# Patient Record
Sex: Female | Born: 1965 | Race: Black or African American | Hispanic: No | Marital: Married | Smoking: Never smoker
Health system: Southern US, Community
[De-identification: ages and names within clinical notes are randomized; demographics above are authoritative.]

## PROBLEM LIST (undated history)

## (undated) DIAGNOSIS — J189 Pneumonia, unspecified organism: Secondary | ICD-10-CM

## (undated) DIAGNOSIS — I509 Heart failure, unspecified: Secondary | ICD-10-CM

## (undated) DIAGNOSIS — C50919 Malignant neoplasm of unspecified site of unspecified female breast: Secondary | ICD-10-CM

## (undated) DIAGNOSIS — IMO0001 Reserved for inherently not codable concepts without codable children: Secondary | ICD-10-CM

## (undated) DIAGNOSIS — Z8679 Personal history of other diseases of the circulatory system: Secondary | ICD-10-CM

## (undated) DIAGNOSIS — Z794 Long term (current) use of insulin: Secondary | ICD-10-CM

## (undated) DIAGNOSIS — E119 Type 2 diabetes mellitus without complications: Secondary | ICD-10-CM

## (undated) DIAGNOSIS — Z923 Personal history of irradiation: Secondary | ICD-10-CM

## (undated) DIAGNOSIS — I10 Essential (primary) hypertension: Secondary | ICD-10-CM

## (undated) DIAGNOSIS — I428 Other cardiomyopathies: Secondary | ICD-10-CM

## (undated) DIAGNOSIS — N6092 Unspecified benign mammary dysplasia of left breast: Secondary | ICD-10-CM

## (undated) DIAGNOSIS — D0512 Intraductal carcinoma in situ of left breast: Secondary | ICD-10-CM

## (undated) DIAGNOSIS — F419 Anxiety disorder, unspecified: Secondary | ICD-10-CM

## (undated) DIAGNOSIS — Z98811 Dental restoration status: Secondary | ICD-10-CM

## (undated) HISTORY — PX: COLONOSCOPY: SHX174

## (undated) HISTORY — PX: KNEE ARTHROSCOPY WITH ANTERIOR CRUCIATE LIGAMENT (ACL) REPAIR: SHX5644

## (undated) HISTORY — DX: Essential (primary) hypertension: I10

## (undated) HISTORY — PX: CARDIAC CATHETERIZATION: SHX172

## (undated) HISTORY — PX: SHOULDER ARTHROSCOPY W/ ROTATOR CUFF REPAIR: SHX2400

---

## 1998-04-03 ENCOUNTER — Other Ambulatory Visit: Admission: RE | Admit: 1998-04-03 | Discharge: 1998-04-03 | Payer: Self-pay | Admitting: Obstetrics

## 1998-04-23 ENCOUNTER — Other Ambulatory Visit: Admission: RE | Admit: 1998-04-23 | Discharge: 1998-04-23 | Payer: Self-pay | Admitting: Internal Medicine

## 1998-05-02 ENCOUNTER — Ambulatory Visit (HOSPITAL_COMMUNITY): Admission: RE | Admit: 1998-05-02 | Discharge: 1998-05-02 | Payer: Self-pay | Admitting: Obstetrics

## 1998-09-18 ENCOUNTER — Inpatient Hospital Stay (HOSPITAL_COMMUNITY): Admission: AD | Admit: 1998-09-18 | Discharge: 1998-09-18 | Payer: Self-pay | Admitting: Obstetrics

## 1998-09-30 ENCOUNTER — Inpatient Hospital Stay (HOSPITAL_COMMUNITY): Admission: AD | Admit: 1998-09-30 | Discharge: 1998-10-03 | Payer: Self-pay | Admitting: Obstetrics

## 1999-05-13 ENCOUNTER — Other Ambulatory Visit: Admission: RE | Admit: 1999-05-13 | Discharge: 1999-05-13 | Payer: Self-pay | Admitting: Obstetrics

## 1999-08-01 ENCOUNTER — Emergency Department (HOSPITAL_COMMUNITY): Admission: EM | Admit: 1999-08-01 | Discharge: 1999-08-01 | Payer: Self-pay

## 1999-10-14 ENCOUNTER — Ambulatory Visit (HOSPITAL_COMMUNITY): Admission: RE | Admit: 1999-10-14 | Discharge: 1999-10-14 | Payer: Self-pay | Admitting: Orthopedic Surgery

## 1999-12-30 ENCOUNTER — Encounter: Admission: RE | Admit: 1999-12-30 | Discharge: 2000-02-13 | Payer: Self-pay | Admitting: Orthopedic Surgery

## 2000-01-26 ENCOUNTER — Other Ambulatory Visit: Admission: RE | Admit: 2000-01-26 | Discharge: 2000-01-26 | Payer: Self-pay | Admitting: Obstetrics

## 2000-05-06 ENCOUNTER — Ambulatory Visit (HOSPITAL_COMMUNITY): Admission: RE | Admit: 2000-05-06 | Discharge: 2000-05-06 | Payer: Self-pay | Admitting: Obstetrics

## 2000-05-06 ENCOUNTER — Encounter: Payer: Self-pay | Admitting: Obstetrics

## 2001-02-23 ENCOUNTER — Other Ambulatory Visit: Admission: RE | Admit: 2001-02-23 | Discharge: 2001-02-23 | Payer: Self-pay | Admitting: Obstetrics

## 2001-06-23 ENCOUNTER — Ambulatory Visit (HOSPITAL_COMMUNITY): Admission: RE | Admit: 2001-06-23 | Discharge: 2001-06-23 | Payer: Self-pay | Admitting: *Deleted

## 2002-09-11 ENCOUNTER — Encounter: Admission: RE | Admit: 2002-09-11 | Discharge: 2002-12-10 | Payer: Self-pay | Admitting: Family Medicine

## 2003-01-23 ENCOUNTER — Emergency Department (HOSPITAL_COMMUNITY): Admission: EM | Admit: 2003-01-23 | Discharge: 2003-01-23 | Payer: Self-pay | Admitting: Emergency Medicine

## 2005-11-17 ENCOUNTER — Ambulatory Visit (HOSPITAL_COMMUNITY): Admission: RE | Admit: 2005-11-17 | Discharge: 2005-11-17 | Payer: Self-pay | Admitting: Obstetrics

## 2006-11-18 ENCOUNTER — Ambulatory Visit (HOSPITAL_COMMUNITY): Admission: RE | Admit: 2006-11-18 | Discharge: 2006-11-18 | Payer: Self-pay | Admitting: Obstetrics

## 2007-11-24 ENCOUNTER — Ambulatory Visit (HOSPITAL_COMMUNITY): Admission: RE | Admit: 2007-11-24 | Discharge: 2007-11-24 | Payer: Self-pay | Admitting: Family Medicine

## 2009-07-25 ENCOUNTER — Ambulatory Visit (HOSPITAL_COMMUNITY): Admission: RE | Admit: 2009-07-25 | Discharge: 2009-07-25 | Payer: Self-pay | Admitting: Family Medicine

## 2010-10-23 ENCOUNTER — Ambulatory Visit (HOSPITAL_COMMUNITY)
Admission: RE | Admit: 2010-10-23 | Discharge: 2010-10-23 | Payer: Self-pay | Source: Home / Self Care | Attending: Family Medicine | Admitting: Family Medicine

## 2011-11-24 ENCOUNTER — Other Ambulatory Visit (HOSPITAL_COMMUNITY): Payer: Self-pay | Admitting: Family Medicine

## 2011-11-24 DIAGNOSIS — Z1231 Encounter for screening mammogram for malignant neoplasm of breast: Secondary | ICD-10-CM

## 2011-12-22 ENCOUNTER — Ambulatory Visit (HOSPITAL_COMMUNITY)
Admission: RE | Admit: 2011-12-22 | Discharge: 2011-12-22 | Disposition: A | Discharge status: Home / Self Care | Payer: 59 | Source: Ambulatory Visit | Attending: Family Medicine | Admitting: Family Medicine

## 2011-12-22 DIAGNOSIS — Z1231 Encounter for screening mammogram for malignant neoplasm of breast: Secondary | ICD-10-CM

## 2014-05-30 ENCOUNTER — Other Ambulatory Visit (HOSPITAL_COMMUNITY): Payer: Self-pay | Admitting: Obstetrics

## 2014-05-30 DIAGNOSIS — Z1231 Encounter for screening mammogram for malignant neoplasm of breast: Secondary | ICD-10-CM

## 2014-05-30 DIAGNOSIS — D219 Benign neoplasm of connective and other soft tissue, unspecified: Secondary | ICD-10-CM

## 2014-06-12 ENCOUNTER — Ambulatory Visit (HOSPITAL_COMMUNITY)
Admission: RE | Admit: 2014-06-12 | Discharge: 2014-06-12 | Disposition: A | Discharge status: Home / Self Care | Payer: Medicaid Other | Source: Ambulatory Visit | Attending: Obstetrics | Admitting: Obstetrics

## 2014-06-12 ENCOUNTER — Other Ambulatory Visit (HOSPITAL_COMMUNITY): Payer: 59

## 2014-06-12 DIAGNOSIS — N83209 Unspecified ovarian cyst, unspecified side: Secondary | ICD-10-CM | POA: Diagnosis not present

## 2014-06-12 DIAGNOSIS — D259 Leiomyoma of uterus, unspecified: Secondary | ICD-10-CM | POA: Insufficient documentation

## 2014-06-12 DIAGNOSIS — D219 Benign neoplasm of connective and other soft tissue, unspecified: Secondary | ICD-10-CM

## 2014-06-12 DIAGNOSIS — R188 Other ascites: Secondary | ICD-10-CM | POA: Insufficient documentation

## 2014-06-12 DIAGNOSIS — Z1231 Encounter for screening mammogram for malignant neoplasm of breast: Secondary | ICD-10-CM | POA: Diagnosis not present

## 2014-09-14 ENCOUNTER — Other Ambulatory Visit: Payer: Self-pay | Admitting: Family Medicine

## 2014-09-14 ENCOUNTER — Ambulatory Visit
Admission: RE | Admit: 2014-09-14 | Discharge: 2014-09-14 | Disposition: A | Discharge status: Home / Self Care | Payer: Medicaid Other | Source: Ambulatory Visit | Attending: Family Medicine | Admitting: Family Medicine

## 2014-09-14 DIAGNOSIS — R053 Chronic cough: Secondary | ICD-10-CM

## 2014-09-14 DIAGNOSIS — R0602 Shortness of breath: Secondary | ICD-10-CM

## 2014-09-14 DIAGNOSIS — R05 Cough: Secondary | ICD-10-CM

## 2014-09-26 ENCOUNTER — Telehealth: Payer: Self-pay | Admitting: Cardiology

## 2014-09-26 NOTE — Telephone Encounter (Signed)
Received, from the [employer], records from [facility] (Dr Florinda Marker) for appointment on 09/28/14 with Dr Stanford Breed.  Records given to [facility] (medical records) for Dr Jacalyn Lefevre schedule on 09/28/14.  lp

## 2014-09-28 ENCOUNTER — Ambulatory Visit (INDEPENDENT_AMBULATORY_CARE_PROVIDER_SITE_OTHER): Payer: Medicaid Other | Admitting: Cardiology

## 2014-09-28 ENCOUNTER — Encounter: Payer: Self-pay | Admitting: *Deleted

## 2014-09-28 ENCOUNTER — Encounter: Payer: Self-pay | Admitting: Cardiology

## 2014-09-28 VITALS — BP 120/90 | HR 120 | Ht 64.0 in | Wt 168.9 lb

## 2014-09-28 DIAGNOSIS — I5021 Acute systolic (congestive) heart failure: Secondary | ICD-10-CM

## 2014-09-28 DIAGNOSIS — IMO0002 Reserved for concepts with insufficient information to code with codable children: Secondary | ICD-10-CM | POA: Insufficient documentation

## 2014-09-28 DIAGNOSIS — E1165 Type 2 diabetes mellitus with hyperglycemia: Secondary | ICD-10-CM | POA: Insufficient documentation

## 2014-09-28 DIAGNOSIS — E785 Hyperlipidemia, unspecified: Secondary | ICD-10-CM

## 2014-09-28 DIAGNOSIS — Z794 Long term (current) use of insulin: Secondary | ICD-10-CM

## 2014-09-28 DIAGNOSIS — E088 Diabetes mellitus due to underlying condition with unspecified complications: Secondary | ICD-10-CM

## 2014-09-28 DIAGNOSIS — I10 Essential (primary) hypertension: Secondary | ICD-10-CM

## 2014-09-28 DIAGNOSIS — R0602 Shortness of breath: Secondary | ICD-10-CM

## 2014-09-28 MED ORDER — ASPIRIN EC 81 MG PO TBEC: 81.0000 mg | DELAYED_RELEASE_TABLET | Freq: Every day | ORAL | Status: DC

## 2014-09-28 MED ORDER — LISINOPRIL 2.5 MG PO TABS: 2.5000 mg | ORAL_TABLET | Freq: Every day | ORAL | Status: DC

## 2014-09-28 MED ORDER — FUROSEMIDE 20 MG PO TABS: 20.0000 mg | ORAL_TABLET | Freq: Every day | ORAL | Status: DC

## 2014-09-28 NOTE — Assessment & Plan Note (Signed)
Blood pressure is elevated. Add lisinopril 2.5 mg daily.We will increase medications both for blood pressure and heart failure as tolerated.

## 2014-09-28 NOTE — Assessment & Plan Note (Signed)
Check lipids and add statin if elevated.

## 2014-09-28 NOTE — Patient Instructions (Signed)
Your physician recommends that you schedule a follow-up appointment in: Friday NEXT WEEK-10-05-14 @ 7:45AM  STOP HCTZ  START LISINOPRIL 2.5 MG ONCE DAILY  START ASPIRIN 81 MG ONCE DAILY  START FUROSEMIDE 50 MG ONCE DAILY  Your physician recommends that you return for lab work in: ONE WEEK=DO NOT EAT PRIOR TO LAB WORK  Your physician has requested that you have an echocardiogram. Echocardiography is a painless test that uses sound waves to create images of your heart. It provides your doctor with information about the size and shape of your heart and how well your heart's chambers and valves are working. This procedure takes approximately one hour. There are no restrictions for this procedure.

## 2014-09-28 NOTE — Assessment & Plan Note (Signed)
Management per primary care. 

## 2014-09-28 NOTE — Progress Notes (Signed)
     HPI: 48 year old female for evaluation of CHF. Patient recently seen for dyspnea and chest pain. Chest x-ray on 09/14/2014 showed small pleural effusions with cardiomegaly and borderline pulmonary venous hypertension. There was trace interstitial edema. Laboratories August 2015 showed a normal hemoglobin and creatinine 0.65. Patient states that since the end of December she has noticed increasing dyspnea on exertion. She then developed orthopnea and PND but no pedal edema. She would have a chest heaviness with lying flat improved with sitting up. Otherwise no exertional chest pain. No recent viral infections. She was seen by primary care and initially given prednisone. She was then given HCTZ and Zyrtec and her symptoms are improving over the past 2 weeks.  Current Outpatient Prescriptions  Medication Sig Dispense Refill  . cetirizine (ZYRTEC) 10 MG tablet Take 10 mg by mouth daily.    . hydrochlorothiazide (HYDRODIURIL) 25 MG tablet Take 25 mg by mouth daily.    . Insulin Glargine (LANTUS SOLOSTAR) 100 UNIT/ML Solostar Pen Inject 80 Units into the skin daily at 10 pm.     No current facility-administered medications for this visit.    No Known Allergies   Past Medical History  Diagnosis Date  . Diabetes mellitus   . Hypertension   . Hyperlipidemia     Past Surgical History  Procedure Laterality Date  . Knee surgery    . Shoulder surgery      History   Social History  . Marital Status: Married    Spouse Name: [name]    Number of Children: 1  . Years of Education: [name]   Occupational History  .      [employer]   Social History Main Topics  . Smoking status: Never Smoker   . Smokeless tobacco: Not on file  . Alcohol Use: 0.0 oz/week    0 Not specified per week     Comment: Rare  . Drug Use: No  . Sexual Activity: Not on file   Other Topics Concern  . Not on file   Social History Narrative  . No narrative on file    Family History  Problem Relation Age of  Onset  . Heart disease      No family history  . Diabetes Mother   . Hypertension Father     ROS: no fevers or chills, productive cough, hemoptysis, dysphasia, odynophagia, melena, hematochezia, dysuria, hematuria, rash, seizure activity, claudication. Remaining systems are negative.  Physical Exam:   Blood pressure 120/90, pulse 120, height 5\' 4"  (1.626 m), weight 168 lb 14.4 oz (76.613 kg), last menstrual period 12/19/2011.  General:  Well developed/well nourished in NAD Skin warm/dry Patient not depressed No peripheral clubbing Back-normal HEENT-normal/normal eyelids Neck supple/normal carotid upstroke bilaterally; no bruits; no JVD; no thyromegaly chest - CTA/ normal expansion CV - RRR/normal S1 and S2; no murmurs, rubs;  PMI nondisplaced, Positive gallop Abdomen -NT/ND, no HSM, no mass, + bowel sounds, no bruit 2+ femoral pulses, no bruits Ext-no edema, chords, 2+ DP Neuro-grossly nonfocal  ECG Sinus tachycardia with occasional PVC.Left ventricular hypertrophy. Nonspecific T-wave changes. Left atrial enlargement.

## 2014-09-28 NOTE — Assessment & Plan Note (Signed)
Patient presents for evaluation of new onset congestive heart failure. She complains of increasing dyspnea on exertion and orthopnea for approximately 1-1/2 months. Her symptoms are improving with low-dose diuretic. Her chest x-ray showed edema and effusions. She is noted to have a gallop on physical examination. She most likely has new onset systolic congestive heart failure. Plan to add aspirin 81 mg daily, lisinopril 2.5 mg daily and Lasix 20 mg daily. Discontinue HCTZ. In one week we will check potassium, renal function, BNP, TSH and ferritin. Schedule echocardiogram to assess LV function. I will see her back in 1 week. If LV function is reduced she will require cardiac catheterization. I will also add a beta blocker at that time if LV function is diminished. Possible etiologies of systolic dysfunction would include hypertension and coronary disease.

## 2014-10-01 ENCOUNTER — Telehealth (HOSPITAL_COMMUNITY): Payer: Self-pay | Admitting: *Deleted

## 2014-10-02 ENCOUNTER — Telehealth (HOSPITAL_COMMUNITY): Payer: Self-pay | Admitting: *Deleted

## 2014-10-03 ENCOUNTER — Telehealth: Payer: Self-pay | Admitting: Cardiology

## 2014-10-03 NOTE — Telephone Encounter (Signed)
This patient left after 5 pm so I left it in the basket at check out. It is written in the AVS patient instructions. Orders are placed for lab work Please have someone call the patient and put her on the schedule. Thank you

## 2014-10-03 NOTE — Telephone Encounter (Signed)
Pt called in stating that she is suppose to some in and see Dr. Stanford Breed on 11/20 at 7:45am but there is not an appt scheduled for this pt. She also states that she is suppose to have labs done as well. Please call and clarify  Thanks

## 2014-10-05 ENCOUNTER — Encounter: Payer: Self-pay | Admitting: *Deleted

## 2014-10-05 ENCOUNTER — Encounter: Payer: Self-pay | Admitting: Cardiology

## 2014-10-05 ENCOUNTER — Ambulatory Visit (HOSPITAL_COMMUNITY)
Admission: RE | Admit: 2014-10-05 | Discharge: 2014-10-05 | Disposition: A | Discharge status: Home / Self Care | Payer: Medicaid Other | Source: Ambulatory Visit | Attending: Cardiology | Admitting: Cardiology

## 2014-10-05 ENCOUNTER — Other Ambulatory Visit: Payer: Self-pay | Admitting: Cardiology

## 2014-10-05 ENCOUNTER — Ambulatory Visit (INDEPENDENT_AMBULATORY_CARE_PROVIDER_SITE_OTHER): Payer: Medicaid Other | Admitting: Cardiology

## 2014-10-05 ENCOUNTER — Telehealth: Payer: Self-pay | Admitting: Cardiology

## 2014-10-05 VITALS — BP 110/90 | HR 61 | Ht 64.5 in | Wt 173.8 lb

## 2014-10-05 DIAGNOSIS — R0602 Shortness of breath: Secondary | ICD-10-CM

## 2014-10-05 DIAGNOSIS — I10 Essential (primary) hypertension: Secondary | ICD-10-CM | POA: Diagnosis not present

## 2014-10-05 DIAGNOSIS — E119 Type 2 diabetes mellitus without complications: Secondary | ICD-10-CM | POA: Diagnosis not present

## 2014-10-05 DIAGNOSIS — Z8249 Family history of ischemic heart disease and other diseases of the circulatory system: Secondary | ICD-10-CM | POA: Diagnosis not present

## 2014-10-05 DIAGNOSIS — I5021 Acute systolic (congestive) heart failure: Secondary | ICD-10-CM

## 2014-10-05 DIAGNOSIS — E785 Hyperlipidemia, unspecified: Secondary | ICD-10-CM | POA: Insufficient documentation

## 2014-10-05 DIAGNOSIS — I059 Rheumatic mitral valve disease, unspecified: Secondary | ICD-10-CM

## 2014-10-05 MED ORDER — ATORVASTATIN CALCIUM 20 MG PO TABS: 20.0000 mg | ORAL_TABLET | Freq: Every day | ORAL | Status: DC

## 2014-10-05 MED ORDER — CARVEDILOL 3.125 MG PO TABS: 3.1250 mg | ORAL_TABLET | Freq: Two times a day (BID) | ORAL | Status: DC

## 2014-10-05 MED ORDER — FUROSEMIDE 20 MG PO TABS: 40.0000 mg | ORAL_TABLET | Freq: Every day | ORAL | Status: DC

## 2014-10-05 NOTE — Progress Notes (Signed)
      HPI: FU CHF. Patient recently seen for dyspnea and chest pain. Chest x-ray on 09/14/2014 showed small pleural effusions with cardiomegaly and borderline pulmonary venous hypertension. There was trace interstitial edema. At last office visit we felt patient had new onset congestive heart failure and we added aspirin, lisinopril and Lasix. We also scheduled follow-up laboratories and an echocardiogram. Since she was last seen, She has some dyspnea on exertion but improved. No orthopnea, pedal edema, chest pain or syncope.  Current Outpatient Prescriptions  Medication Sig Dispense Refill  . aspirin EC 81 MG tablet Take 1 tablet (81 mg total) by mouth daily. 90 tablet 3  . cetirizine (ZYRTEC) 10 MG tablet Take 10 mg by mouth daily.    . furosemide (LASIX) 20 MG tablet Take 1 tablet (20 mg total) by mouth daily. 90 tablet 3  . hydrochlorothiazide (HYDRODIURIL) 25 MG tablet Take 25 mg by mouth daily.  0  . Insulin Glargine (LANTUS SOLOSTAR) 100 UNIT/ML Solostar Pen Inject 80 Units into the skin daily at 10 pm.    . lisinopril (PRINIVIL,ZESTRIL) 2.5 MG tablet Take 1 tablet (2.5 mg total) by mouth daily. 90 tablet 3   No current facility-administered medications for this visit.     Past Medical History  Diagnosis Date  . Diabetes mellitus   . Hypertension   . Hyperlipidemia     Past Surgical History  Procedure Laterality Date  . Knee surgery    . Shoulder surgery      History   Social History  . Marital Status: Married    Spouse Name: [name]    Number of Children: 1  . Years of Education: [name]   Occupational History  .      [employer]   Social History Main Topics  . Smoking status: Never Smoker   . Smokeless tobacco: Not on file  . Alcohol Use: 0.0 oz/week    0 Not specified per week     Comment: Rare  . Drug Use: No  . Sexual Activity: Not on file   Other Topics Concern  . Not on file   Social History Narrative    ROS: no fevers or chills, productive cough,  hemoptysis, dysphasia, odynophagia, melena, hematochezia, dysuria, hematuria, rash, seizure activity, orthopnea, PND, pedal edema, claudication. Remaining systems are negative.  Physical Exam: Well-developed well-nourished in no acute distress.  Skin is warm and dry.  HEENT is normal.  Neck is supple.  Chest is clear to auscultation with normal expansion.  Cardiovascular exam is regular rate and rhythm.  Abdominal exam nontender or distended. No masses palpated. Extremities show no edema. neuro grossly intact

## 2014-10-05 NOTE — Telephone Encounter (Signed)
Spoke with pt, aware not to take the HCTZ.

## 2014-10-05 NOTE — Telephone Encounter (Signed)
Pt is calling to let Dr.Crenshaw know that she is in fact taking HCTZ 25mg .  She would like to know if this is a medication that she should continue taking or not. Please call  Thanks

## 2014-10-05 NOTE — Progress Notes (Signed)
2D Echocardiogram Complete.  10/05/2014   Deliah Boston, RDCS  Preliminary Technician Findings:  Severely Decreased Ejection Fraction (approx. 15%).  Dr. Stanford Breed is aware, and will schedule patient for catheterization.

## 2014-10-05 NOTE — Assessment & Plan Note (Signed)
Add Lipitor 20 mg daily. Check lipids and liver in 6 weeks. 

## 2014-10-05 NOTE — Patient Instructions (Addendum)
Your physician recommends that you schedule a follow-up appointment in: [place]  Your physician recommends that you schedule a follow-up appointment in: [place] has requested that you have a cardiac catheterization. Cardiac catheterization is used to diagnose and/or treat various heart conditions. Doctors may recommend this procedure for a number of different reasons. The most common reason is to evaluate chest pain. Chest pain can be a symptom of coronary artery disease (CAD), and cardiac catheterization can show whether plaque is narrowing or blocking your heart's arteries. This procedure is also used to evaluate the valves, as well as measure the blood flow and oxygen levels in different parts of your heart. For further information please visit [email or web address]. Please follow instruction sheet, as given.     STOP ZYRTEC   STOP HCTZ  INCREASE FUROSEMIDE TO 40 MG ONCE DAILY= 2 OF THE 20 MG TABLETS ONCE DAILY  START CARVEDILOL 3.125 MG ONE TABLET TWICE DAILY  Your physician recommends that you return for lab work in: [place]  START ATORVASTATIN 20 [place] recommends that you return for lab work in: [place] TO LAB WORK

## 2014-10-05 NOTE — Assessment & Plan Note (Signed)
Blood pressure controlled. Present medications.

## 2014-10-05 NOTE — Addendum Note (Signed)
Addended by: Cristopher Estimable on: 10/05/2014 03:12 PM   Modules accepted: Orders

## 2014-10-05 NOTE — Assessment & Plan Note (Addendum)
Patient symptoms are improving but persist. Plan to continue aspirin 81 mg daily, lisinopril 2.5 mg daily; increase lasix to 40 mg daily. Discontinue HCTZ. Add carvedilol 3.125 mg by mouth twice a day. Check potassium, renal function, BNP, TSH and ferritin. We'll also check potassium and renal function again in 1 week. Await echocardiogram to assess LV function. If LV function is reduced she will require cardiac catheterization. Possible etiologies of systolic dysfunction would include hypertension and coronary disease.  Echocardiogram was performed this afternoon and reviewed personally. Her ejection fraction is approximately 15% with global hypokinesis. Plan to proceed with cardiac catheterization (right and left). The risks and benefits were discussed and the patient agrees to proceed. I will also refer her to the CHF clinic afterwards for titration of medications. Once fully titrated we will plan to repeat echocardiogram 3 months later to reassess LV function. If it does not improve she would require ICD.

## 2014-10-05 NOTE — Addendum Note (Signed)
Addended by: Lelon Perla on: 10/05/2014 04:47 PM   Modules accepted: Level of Service

## 2014-10-06 LAB — LIPID PANEL
CHOL/HDL RATIO: 2.6 ratio
Cholesterol: 161 mg/dL (ref 0–200)
HDL: 63 mg/dL (ref >39)
LDL CALC: 78 mg/dL (ref 0–99)
TRIGLYCERIDES: 98 mg/dL (ref <150)
VLDL: 20 mg/dL (ref 0–40)

## 2014-10-06 LAB — BASIC METABOLIC PANEL WITH GFR
BUN: 12 mg/dL (ref 6–23)
CALCIUM: 8.9 mg/dL (ref 8.4–10.5)
CO2: 26 meq/L (ref 19–32)
CREATININE: 0.71 mg/dL (ref 0.50–1.10)
Chloride: 102 mEq/L (ref 96–112)
GFR, Est Non African American: >89 mL/min
GLUCOSE: 61 mg/dL — AB (ref 70–99)
Potassium: 3.7 mEq/L (ref 3.5–5.3)
SODIUM: 140 meq/L (ref 135–145)

## 2014-10-06 LAB — BRAIN NATRIURETIC PEPTIDE: Brain Natriuretic Peptide: 405.9 pg/mL — ABNORMAL HIGH (ref 0.0–100.0)

## 2014-10-06 LAB — FERRITIN: Ferritin: 10 ng/mL (ref 10–291)

## 2014-10-06 LAB — TSH: TSH: 0.741 u[IU]/mL (ref 0.350–4.500)

## 2014-10-08 ENCOUNTER — Ambulatory Visit (HOSPITAL_COMMUNITY)
Admission: RE | Admit: 2014-10-08 | Discharge: 2014-10-08 | Disposition: A | Discharge status: Home / Self Care | Payer: Medicaid Other | Source: Ambulatory Visit | Attending: Interventional Cardiology | Admitting: Interventional Cardiology

## 2014-10-08 ENCOUNTER — Encounter: Payer: Self-pay | Admitting: *Deleted

## 2014-10-08 ENCOUNTER — Encounter (HOSPITAL_COMMUNITY)
Admission: RE | Disposition: A | Discharge status: Home / Self Care | Payer: Self-pay | Source: Ambulatory Visit | Attending: Interventional Cardiology

## 2014-10-08 DIAGNOSIS — I10 Essential (primary) hypertension: Secondary | ICD-10-CM | POA: Insufficient documentation

## 2014-10-08 DIAGNOSIS — I429 Cardiomyopathy, unspecified: Secondary | ICD-10-CM

## 2014-10-08 DIAGNOSIS — Z7982 Long term (current) use of aspirin: Secondary | ICD-10-CM | POA: Diagnosis not present

## 2014-10-08 DIAGNOSIS — E785 Hyperlipidemia, unspecified: Secondary | ICD-10-CM | POA: Insufficient documentation

## 2014-10-08 DIAGNOSIS — R0600 Dyspnea, unspecified: Secondary | ICD-10-CM | POA: Diagnosis present

## 2014-10-08 DIAGNOSIS — I5021 Acute systolic (congestive) heart failure: Secondary | ICD-10-CM

## 2014-10-08 DIAGNOSIS — E119 Type 2 diabetes mellitus without complications: Secondary | ICD-10-CM | POA: Diagnosis not present

## 2014-10-08 DIAGNOSIS — I270 Primary pulmonary hypertension: Secondary | ICD-10-CM

## 2014-10-08 DIAGNOSIS — I509 Heart failure, unspecified: Secondary | ICD-10-CM | POA: Diagnosis not present

## 2014-10-08 DIAGNOSIS — Z794 Long term (current) use of insulin: Secondary | ICD-10-CM | POA: Insufficient documentation

## 2014-10-08 HISTORY — PX: LEFT AND RIGHT HEART CATHETERIZATION WITH CORONARY ANGIOGRAM: SHX5449

## 2014-10-08 LAB — CBC
HCT: 35.5 % — ABNORMAL LOW (ref 36.0–46.0)
Hemoglobin: 11 g/dL — ABNORMAL LOW (ref 12.0–15.0)
MCH: 19.9 pg — ABNORMAL LOW (ref 26.0–34.0)
MCHC: 31 g/dL (ref 30.0–36.0)
MCV: 64.2 fL — ABNORMAL LOW (ref 78.0–100.0)
Platelets: 438 10*3/uL — ABNORMAL HIGH (ref 150–400)
RBC: 5.53 MIL/uL — ABNORMAL HIGH (ref 3.87–5.11)
RDW: 17.5 % — AB (ref 11.5–15.5)
WBC: 4.7 10*3/uL (ref 4.0–10.5)

## 2014-10-08 LAB — POCT I-STAT 3, VENOUS BLOOD GAS (G3P V)
Acid-Base Excess: 2 mmol/L (ref 0.0–2.0)
BICARBONATE: 26 meq/L — AB (ref 20.0–24.0)
O2 Saturation: 62 %
PCO2 VEN: 39.1 mmHg — AB (ref 45.0–50.0)
PH VEN: 7.431 — AB (ref 7.250–7.300)
PO2 VEN: 31 mmHg (ref 30.0–45.0)
TCO2: 27 mmol/L (ref 0–100)

## 2014-10-08 LAB — GLUCOSE, CAPILLARY
Glucose-Capillary: 65 mg/dL — ABNORMAL LOW (ref 70–99)
Glucose-Capillary: 123 mg/dL — ABNORMAL HIGH (ref 70–99)

## 2014-10-08 LAB — POCT I-STAT 3, ART BLOOD GAS (G3+)
Acid-base deficit: 1 mmol/L (ref 0.0–2.0)
BICARBONATE: 23.4 meq/L (ref 20.0–24.0)
O2 SAT: 95 %
PCO2 ART: 36.1 mmHg (ref 35.0–45.0)
TCO2: 25 mmol/L (ref 0–100)
pH, Arterial: 7.421 (ref 7.350–7.450)
pO2, Arterial: 74 mmHg — ABNORMAL LOW (ref 80.0–100.0)

## 2014-10-08 LAB — PROTIME-INR
INR: 1.18 (ref 0.00–1.49)
Prothrombin Time: 15.1 seconds (ref 11.6–15.2)

## 2014-10-08 LAB — PREGNANCY, URINE: Preg Test, Ur: NEGATIVE

## 2014-10-08 SURGERY — LEFT AND RIGHT HEART CATHETERIZATION WITH CORONARY ANGIOGRAM
Anesthesia: LOCAL

## 2014-10-08 MED ORDER — SODIUM CHLORIDE 0.9 % IV SOLN
INTRAVENOUS | Status: DC
  Administered 2014-10-08: 1000 mL via INTRAVENOUS

## 2014-10-08 MED ORDER — FENTANYL CITRATE 0.05 MG/ML IJ SOLN
INTRAMUSCULAR | Status: AC
  Filled 2014-10-08: qty 2

## 2014-10-08 MED ORDER — MIDAZOLAM HCL 2 MG/2ML IJ SOLN
INTRAMUSCULAR | Status: AC
  Filled 2014-10-08: qty 2

## 2014-10-08 MED ORDER — SODIUM CHLORIDE 0.9 % IJ SOLN: 3.0000 mL | INTRAMUSCULAR | Status: DC | PRN

## 2014-10-08 MED ORDER — VERAPAMIL HCL 2.5 MG/ML IV SOLN
INTRAVENOUS | Status: AC
  Filled 2014-10-08: qty 2

## 2014-10-08 MED ORDER — NITROGLYCERIN 1 MG/10 ML FOR IR/CATH LAB
INTRA_ARTERIAL | Status: AC
  Filled 2014-10-08: qty 10

## 2014-10-08 MED ORDER — LIDOCAINE HCL (PF) 1 % IJ SOLN
INTRAMUSCULAR | Status: AC
  Filled 2014-10-08: qty 30

## 2014-10-08 MED ORDER — SODIUM CHLORIDE 0.9 % IV SOLN: 1.0000 mL/kg/h | INTRAVENOUS | Status: AC

## 2014-10-08 MED ORDER — SODIUM CHLORIDE 0.9 % IV SOLN: 250.0000 mL | INTRAVENOUS | Status: DC | PRN

## 2014-10-08 MED ORDER — HEPARIN SODIUM (PORCINE) 1000 UNIT/ML IJ SOLN
INTRAMUSCULAR | Status: AC
  Filled 2014-10-08: qty 1

## 2014-10-08 MED ORDER — ASPIRIN 81 MG PO CHEW
CHEWABLE_TABLET | ORAL | Status: AC
  Filled 2014-10-08: qty 1

## 2014-10-08 MED ORDER — DEXTROSE 50 % IV SOLN
INTRAVENOUS | Status: AC
  Administered 2014-10-08: 25 mL
  Filled 2014-10-08: qty 50

## 2014-10-08 MED ORDER — SODIUM CHLORIDE 0.9 % IJ SOLN: 3.0000 mL | Freq: Two times a day (BID) | INTRAMUSCULAR | Status: DC

## 2014-10-08 MED ORDER — HEPARIN (PORCINE) IN NACL 2-0.9 UNIT/ML-% IJ SOLN
INTRAMUSCULAR | Status: AC
  Filled 2014-10-08: qty 1000

## 2014-10-08 MED ORDER — ASPIRIN 81 MG PO CHEW
81.0000 mg | CHEWABLE_TABLET | ORAL | Status: AC
  Administered 2014-10-08: 81 mg via ORAL

## 2014-10-08 NOTE — Discharge Instructions (Signed)
Radial Site Care °Refer to this sheet in the next few weeks. These instructions provide you with information on caring for yourself after your procedure. Your caregiver may also give you more specific instructions. Your treatment has been planned according to current medical practices, but problems sometimes occur. Call your caregiver if you have any problems or questions after your procedure. °HOME CARE INSTRUCTIONS °· You may shower the day after the procedure. Remove the bandage (dressing) and gently wash the site with plain soap and water. Gently pat the site dry. °· Do not apply powder or lotion to the site. °· Do not submerge the affected site in water for 3 to 5 days. °· Inspect the site at least twice daily. °· Do not flex or bend the affected arm for 24 hours. °· No lifting over 5 pounds (2.3 kg) for 5 days after your procedure. °· Do not drive home if you are discharged the same day of the procedure. Have someone else drive you. °· You may drive 24 hours after the procedure unless otherwise instructed by your caregiver. °· Do not operate machinery or power tools for 24 hours. °· A responsible adult should be with you for the first 24 hours after you arrive home. °What to expect: °· Any bruising will usually fade within 1 to 2 weeks. °· Blood that collects in the tissue (hematoma) may be painful to the touch. It should usually decrease in size and tenderness within 1 to 2 weeks. °SEEK IMMEDIATE MEDICAL CARE IF: °· You have unusual pain at the radial site. °· You have redness, warmth, swelling, or pain at the radial site. °· You have drainage (other than a small amount of blood on the dressing). °· You have chills. °· You have a fever or persistent symptoms for more than 72 hours. °· You have a fever and your symptoms suddenly get worse. °· Your arm becomes pale, cool, tingly, or numb. °· You have heavy bleeding from the site. Hold pressure on the site. °Document Released: 12/05/2010 Document Revised:  01/25/2012 Document Reviewed: 12/05/2010 °ExitCare® Patient Information ©2015 [employer]. This information is not intended to replace advice given to you by your health care provider. Make sure you discuss any questions you have with your health care provider. ° °

## 2014-10-08 NOTE — Progress Notes (Signed)
Hypoglycemic Event  CBG: 65  Treatment: D50 IV 25 mL  Symptoms: Sweaty  Follow-up CBG: Time:1145 CBG Result:123  Possible Reasons for Event: Other: NPO for cardiac catheterization  Comments/MD notified:Jennifer O'Neal, RN advised    Kristen Snyder  Remember to initiate Hypoglycemia Order Set & complete

## 2014-10-08 NOTE — Interval H&P Note (Signed)
History and Physical Interval Note:  10/08/2014 12:23 PM  Kristen Snyder  has presented today for surgery, with the diagnosis of cardiomyopathy  The various methods of treatment have been discussed with the patient and family. After consideration of risks, benefits and other options for treatment, the patient has consented to  Procedure(s): LEFT AND RIGHT HEART CATHETERIZATION WITH CORONARY ANGIOGRAM ([name]) as a surgical intervention .  The patient's history has been reviewed, patient examined, no change in status, stable for surgery.  I have reviewed the patient's chart and labs.  Questions were answered to the patient's satisfaction.    Cath Lab Visit (complete for each Cath Lab visit)  Clinical Evaluation Leading to the Procedure:   ACS: No.  Non-ACS:    Anginal Classification: CCS I  Anti-ischemic medical therapy: Minimal Therapy (1 class of medications)  Non-Invasive Test Results: High-risk stress test findings: cardiac mortality >3%/year  Prior CABG: No previous CABG   Ischemic Symptoms? CCS I (Ordinary physical activity does not cause anginal symptoms) Anti-ischemic Medical Therapy? Minimal Therapy (1 class of medications) Non-invasive Test Results? High-risk stress test findings: cardiac mortality >3%/yr Prior CABG? No Previous CABG   Patient Information:   1-2V CAD, no prox LAD  A (7)  Indication: 18; Score: 7   Patient Information:   CTO of 1 vessel, no other CAD  U (5)  Indication: 28; Score: 5   Patient Information:   1V CAD with prox LAD  A (8)  Indication: 34; Score: 8   Patient Information:   2V-CAD with prox LAD  A (8)  Indication: 40; Score: 8   Patient Information:   3V-CAD without LMCA  A (8)  Indication: 46; Score: 8   Patient Information:   3V-CAD without LMCA With Abnormal LV systolic function  A (9)  Indication: 48; Score: 9   Patient Information:   LMCA-CAD  A (9)  Indication: 49; Score: 9   Patient Information:    2V-CAD with prox LAD PCI  A (7)  Indication: 62; Score: 7   Patient Information:   2V-CAD with prox LAD CABG  A (8)  Indication: 62; Score: 8   Patient Information:   3V-CAD without LMCA With Low CAD burden(i.e., 3 focal stenoses, low SYNTAX score) PCI  A (7)  Indication: 63; Score: 7   Patient Information:   3V-CAD without LMCA With Low CAD burden(i.e., 3 focal stenoses, low SYNTAX score) CABG  A (9)  Indication: 63; Score: 9   Patient Information:   3V-CAD without LMCA E06c - Intermediate-high CAD burden (i.e., multiple diffuse lesions, presence of CTO, or high SYNTAX score) PCI  U (4)  Indication: 64; Score: 4   Patient Information:   3V-CAD without LMCA E06c - Intermediate-high CAD burden (i.e., multiple diffuse lesions, presence of CTO, or high SYNTAX score) CABG  A (9)  Indication: 64; Score: 9   Patient Information:   LMCA-CAD With Isolated LMCA stenosis  PCI  U (6)  Indication: 65; Score: 6   Patient Information:   LMCA-CAD With Isolated LMCA stenosis  CABG  A (9)  Indication: 65; Score: 9   Patient Information:   LMCA-CAD Additional CAD, low CAD burden (i.e., 1- to 2-vessel additional involvement, low SYNTAX score) PCI  U (5)  Indication: 66; Score: 5   Patient Information:   LMCA-CAD Additional CAD, low CAD burden (i.e., 1- to 2-vessel additional involvement, low SYNTAX score) CABG  A (9)  Indication: 66; Score: 9   Patient Information:  LMCA-CAD Additional CAD, intermediate-high CAD burden (i.e., 3-vessel involvement, presence of CTO, or high SYNTAX score) PCI  I (3)  Indication: 67; Score: 3   Patient Information:   LMCA-CAD Additional CAD, intermediate-high CAD burden (i.e., 3-vessel involvement, presence of CTO, or high SYNTAX score) CABG  A (9)  Indication: 67; Score: 9         Kristen Pascual S.

## 2014-10-08 NOTE — H&P (View-Only) (Signed)
      HPI: FU CHF. Patient recently seen for dyspnea and chest pain. Chest x-ray on 09/14/2014 showed small pleural effusions with cardiomegaly and borderline pulmonary venous hypertension. There was trace interstitial edema. At last office visit we felt patient had new onset congestive heart failure and we added aspirin, lisinopril and Lasix. We also scheduled follow-up laboratories and an echocardiogram. Since she was last seen, She has some dyspnea on exertion but improved. No orthopnea, pedal edema, chest pain or syncope.  Current Outpatient Prescriptions  Medication Sig Dispense Refill  . aspirin EC 81 MG tablet Take 1 tablet (81 mg total) by mouth daily. 90 tablet 3  . cetirizine (ZYRTEC) 10 MG tablet Take 10 mg by mouth daily.    . furosemide (LASIX) 20 MG tablet Take 1 tablet (20 mg total) by mouth daily. 90 tablet 3  . hydrochlorothiazide (HYDRODIURIL) 25 MG tablet Take 25 mg by mouth daily.  0  . Insulin Glargine (LANTUS SOLOSTAR) 100 UNIT/ML Solostar Pen Inject 80 Units into the skin daily at 10 pm.    . lisinopril (PRINIVIL,ZESTRIL) 2.5 MG tablet Take 1 tablet (2.5 mg total) by mouth daily. 90 tablet 3   No current facility-administered medications for this visit.     Past Medical History  Diagnosis Date  . Diabetes mellitus   . Hypertension   . Hyperlipidemia     Past Surgical History  Procedure Laterality Date  . Knee surgery    . Shoulder surgery      History   Social History  . Marital Status: Married    Spouse Name: [name]    Number of Children: 1  . Years of Education: [name]   Occupational History  .      [employer]   Social History Main Topics  . Smoking status: Never Smoker   . Smokeless tobacco: Not on file  . Alcohol Use: 0.0 oz/week    0 Not specified per week     Comment: Rare  . Drug Use: No  . Sexual Activity: Not on file   Other Topics Concern  . Not on file   Social History Narrative    ROS: no fevers or chills, productive cough,  hemoptysis, dysphasia, odynophagia, melena, hematochezia, dysuria, hematuria, rash, seizure activity, orthopnea, PND, pedal edema, claudication. Remaining systems are negative.  Physical Exam: Well-developed well-nourished in no acute distress.  Skin is warm and dry.  HEENT is normal.  Neck is supple.  Chest is clear to auscultation with normal expansion.  Cardiovascular exam is regular rate and rhythm.  Abdominal exam nontender or distended. No masses palpated. Extremities show no edema. neuro grossly intact

## 2014-10-08 NOTE — CV Procedure (Signed)
       PROCEDURE: Right heart cath; Left heart catheterization with selective coronary angiography, left ventriculogram.  INDICATIONS:  Cardiomyopathy  The risks, benefits, and details of the procedure were explained to the patient.  The patient verbalized understanding and wanted to proceed.  Informed written consent was obtained.  PROCEDURE TECHNIQUE:  After Xylocaine anesthesia, an antecubital IV in the right was exchanged for a right brachial sheath. After Xylocaine anesthesia, a 37F slender sheath was placed in the right radial artery with a single anterior needle wall stick.   IV Heparin was given.  Right coronary angiography was done using a Judkins R4 guide catheter.  Left coronary angiography was done using a Judkins L3.5 guide catheter.  Left ventriculography was done using a pigtail catheter.  A TR band was used for hemostasis.   CONTRAST:  Total of 70 cc.  COMPLICATIONS:  None.    HEMODYNAMICS:  Aortic pressure was 100/80; LV pressure was 110/7; LVEDP 25.  There was no gradient between the left ventricle and aorta.    ANGIOGRAPHIC DATA:   The left main coronary artery is widely patent.  The left anterior descending artery is a large vessel which wraps around the apex. The first diagonal vessel is medium-sized and patent. In the LAD after the origin of the first diagonal, there is some mild, eccentric atherosclerotic plaque. The mid to distal LAD is widely patent. The first diagonal vessel is widely patent.  The left circumflex artery is a large vessel. There is a large ramus vessel which is widely patent the first obtuse marginal is widely patent. The second obtuse marginal is widely patent as well.  The right coronary artery is a medium sized dominant vessel.  The posterior descending artery and posterior lateral arteries are small but patent.  LEFT VENTRICULOGRAM:  Left ventricular angiogram was done in the 30 RAO projection and revealed normal left ventricular wall motion and  systolic function with an estimated ejection fraction of 15%.  LVEDP was 25 mmHg.  right atrial pressure 8/0, mean right atrial pressure 7 mmHg; RV pressure 40/ 5, RVEDP 9 mmHg; PA pressure 43/23; mean PA pressure 32 mmHg, unable to properly wedge the PA catheter. PA saturation 62%, aortic saturation 95%, cardiac output 4.9 L/m. Cardiac index 2.69.  IMPRESSIONS:  1. Normal left main coronary artery. 2. Mild plaque in the mid left anterior descending artery. 3. Widely patent left circumflex artery and its branches. 4. Widely patent right coronary artery. 5. Severely decreased global left ventricular systolic function.  LVEDP 25 mmHg.  Ejection fraction 15%. 6.   PA saturation 62%. Cardiac output 4.9 L/m. Cardiac index 2.69. Mild pulmonary hypertension.  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 She does have some mild atherosclerosis in the LAD but this does not explain her profound cardiomyopathy. Continue follow-up with Dr. Stanford Breed.

## 2014-10-08 NOTE — Progress Notes (Signed)
Pt is ready to go home. Waiting for her husband to come and pick her up.

## 2014-10-15 ENCOUNTER — Telehealth: Payer: Self-pay | Admitting: Cardiology

## 2014-10-15 NOTE — Telephone Encounter (Signed)
Pt. Informed that this issue would be bettter handeled with her visit on the 4th of dec

## 2014-10-15 NOTE — Telephone Encounter (Signed)
She says to feel free to leave a detailed msg on her phone if she doesn't answer

## 2014-10-15 NOTE — Telephone Encounter (Signed)
Pt called in stating that since her cath procedure on 11/23 she has been experiencing some edemas in thighs all the way down to her ankles. She stated that she has started some meds that she feels that may be causing this side effects. Those meds are: Carvedilol, Atorvastatin, Lisinopril, and baby aspirin. She would like to know what she should do to stop the swelling . Please call  Thanks

## 2014-10-16 DIAGNOSIS — Z8679 Personal history of other diseases of the circulatory system: Secondary | ICD-10-CM

## 2014-10-16 HISTORY — DX: Personal history of other diseases of the circulatory system: Z86.79

## 2014-10-19 ENCOUNTER — Ambulatory Visit (HOSPITAL_BASED_OUTPATIENT_CLINIC_OR_DEPARTMENT_OTHER)
Admission: RE | Admit: 2014-10-19 | Discharge: 2014-10-19 | Disposition: A | Discharge status: Home / Self Care | Payer: Medicaid Other | Source: Ambulatory Visit | Attending: Internal Medicine | Admitting: Internal Medicine

## 2014-10-19 ENCOUNTER — Ambulatory Visit: Payer: Medicaid Other | Admitting: Cardiology

## 2014-10-19 ENCOUNTER — Ambulatory Visit (HOSPITAL_COMMUNITY): Payer: Medicaid Other

## 2014-10-19 ENCOUNTER — Inpatient Hospital Stay (HOSPITAL_COMMUNITY)
Admission: AD | Admit: 2014-10-19 | Discharge: 2014-10-23 | DRG: 292 | Disposition: A | Discharge status: Home / Self Care | Payer: Medicaid Other | Source: Ambulatory Visit | Attending: Internal Medicine | Admitting: Internal Medicine

## 2014-10-19 ENCOUNTER — Encounter (HOSPITAL_COMMUNITY): Payer: Self-pay

## 2014-10-19 ENCOUNTER — Other Ambulatory Visit (HOSPITAL_COMMUNITY): Payer: Self-pay | Admitting: Adult Health

## 2014-10-19 VITALS — BP 140/92 | HR 117 | Wt 188.0 lb

## 2014-10-19 DIAGNOSIS — I5021 Acute systolic (congestive) heart failure: Secondary | ICD-10-CM

## 2014-10-19 DIAGNOSIS — E119 Type 2 diabetes mellitus without complications: Secondary | ICD-10-CM | POA: Diagnosis present

## 2014-10-19 DIAGNOSIS — I5023 Acute on chronic systolic (congestive) heart failure: Secondary | ICD-10-CM

## 2014-10-19 DIAGNOSIS — R0600 Dyspnea, unspecified: Secondary | ICD-10-CM | POA: Diagnosis present

## 2014-10-19 DIAGNOSIS — Z79899 Other long term (current) drug therapy: Secondary | ICD-10-CM

## 2014-10-19 DIAGNOSIS — I5020 Unspecified systolic (congestive) heart failure: Secondary | ICD-10-CM

## 2014-10-19 DIAGNOSIS — I428 Other cardiomyopathies: Secondary | ICD-10-CM | POA: Diagnosis present

## 2014-10-19 DIAGNOSIS — E785 Hyperlipidemia, unspecified: Secondary | ICD-10-CM | POA: Diagnosis present

## 2014-10-19 DIAGNOSIS — Z9114 Patient's other noncompliance with medication regimen: Secondary | ICD-10-CM | POA: Diagnosis present

## 2014-10-19 DIAGNOSIS — Z794 Long term (current) use of insulin: Secondary | ICD-10-CM

## 2014-10-19 DIAGNOSIS — Z7982 Long term (current) use of aspirin: Secondary | ICD-10-CM | POA: Diagnosis not present

## 2014-10-19 DIAGNOSIS — R0683 Snoring: Secondary | ICD-10-CM | POA: Diagnosis present

## 2014-10-19 LAB — COMPREHENSIVE METABOLIC PANEL
ALT: 52 U/L — AB (ref 0–35)
AST: 35 U/L (ref 0–37)
Albumin: 3.1 g/dL — ABNORMAL LOW (ref 3.5–5.2)
Alkaline Phosphatase: 83 U/L (ref 39–117)
Anion gap: 14 (ref 5–15)
BUN: 13 mg/dL (ref 6–23)
CO2: 21 meq/L (ref 19–32)
Calcium: 8.9 mg/dL (ref 8.4–10.5)
Chloride: 104 mEq/L (ref 96–112)
Creatinine, Ser: 0.72 mg/dL (ref 0.50–1.10)
GFR calc Af Amer: >90 mL/min (ref >90)
Glucose, Bld: 128 mg/dL — ABNORMAL HIGH (ref 70–99)
POTASSIUM: 4.3 meq/L (ref 3.7–5.3)
SODIUM: 139 meq/L (ref 137–147)
Total Bilirubin: 0.4 mg/dL (ref 0.3–1.2)
Total Protein: 6 g/dL (ref 6.0–8.3)

## 2014-10-19 LAB — MAGNESIUM: Magnesium: 2 mg/dL (ref 1.5–2.5)

## 2014-10-19 LAB — CBC WITH DIFFERENTIAL/PLATELET
BASOS PCT: 1 % (ref 0–1)
Basophils Absolute: 0.1 10*3/uL (ref 0.0–0.1)
EOS PCT: 2 % (ref 0–5)
Eosinophils Absolute: 0.1 10*3/uL (ref 0.0–0.7)
HCT: 35.1 % — ABNORMAL LOW (ref 36.0–46.0)
HEMOGLOBIN: 10.8 g/dL — AB (ref 12.0–15.0)
Lymphocytes Relative: 33 % (ref 12–46)
Lymphs Abs: 2.1 10*3/uL (ref 0.7–4.0)
MCH: 19.2 pg — ABNORMAL LOW (ref 26.0–34.0)
MCHC: 30.8 g/dL (ref 30.0–36.0)
MCV: 62.3 fL — ABNORMAL LOW (ref 78.0–100.0)
Monocytes Absolute: 0.5 10*3/uL (ref 0.1–1.0)
Monocytes Relative: 8 % (ref 3–12)
NEUTROS PCT: 56 % (ref 43–77)
Neutro Abs: 3.7 10*3/uL (ref 1.7–7.7)
Platelets: 403 10*3/uL — ABNORMAL HIGH (ref 150–400)
RBC: 5.63 MIL/uL — AB (ref 3.87–5.11)
RDW: 18 % — ABNORMAL HIGH (ref 11.5–15.5)
WBC: 6.5 10*3/uL (ref 4.0–10.5)

## 2014-10-19 LAB — GLUCOSE, CAPILLARY
Glucose-Capillary: 189 mg/dL — ABNORMAL HIGH (ref 70–99)
Glucose-Capillary: 176 mg/dL — ABNORMAL HIGH (ref 70–99)

## 2014-10-19 LAB — PRO B NATRIURETIC PEPTIDE: Pro B Natriuretic peptide (BNP): 1662 pg/mL — ABNORMAL HIGH (ref 0–125)

## 2014-10-19 MED ORDER — SODIUM CHLORIDE 0.9 % IJ SOLN
3.0000 mL | Freq: Two times a day (BID) | INTRAMUSCULAR | Status: DC
  Administered 2014-10-19: 3 mL via INTRAVENOUS

## 2014-10-19 MED ORDER — SODIUM CHLORIDE 0.9 % IJ SOLN: 3.0000 mL | INTRAMUSCULAR | Status: DC | PRN

## 2014-10-19 MED ORDER — DIGOXIN 125 MCG PO TABS
0.1250 mg | ORAL_TABLET | Freq: Every day | ORAL | Status: DC
  Administered 2014-10-19 – 2014-10-23 (×5): 0.125 mg via ORAL
  Filled 2014-10-19 (×5): qty 1

## 2014-10-19 MED ORDER — INSULIN GLARGINE 100 UNIT/ML ~~LOC~~ SOLN
80.0000 [IU] | Freq: Every day | SUBCUTANEOUS | Status: DC
  Administered 2014-10-19 – 2014-10-22 (×4): 80 [IU] via SUBCUTANEOUS
  Filled 2014-10-19 (×5): qty 0.8

## 2014-10-19 MED ORDER — INSULIN ASPART 100 UNIT/ML ~~LOC~~ SOLN
0.0000 [IU] | Freq: Three times a day (TID) | SUBCUTANEOUS | Status: DC
  Administered 2014-10-20: 1 [IU] via SUBCUTANEOUS
  Administered 2014-10-21: 2 [IU] via SUBCUTANEOUS
  Administered 2014-10-21 – 2014-10-22 (×2): 1 [IU] via SUBCUTANEOUS
  Administered 2014-10-22: 3 [IU] via SUBCUTANEOUS

## 2014-10-19 MED ORDER — FUROSEMIDE 10 MG/ML IJ SOLN
80.0000 mg | Freq: Two times a day (BID) | INTRAMUSCULAR | Status: DC
  Administered 2014-10-19 – 2014-10-20 (×3): 80 mg via INTRAVENOUS
  Filled 2014-10-19 (×4): qty 8

## 2014-10-19 MED ORDER — ENOXAPARIN SODIUM 40 MG/0.4ML ~~LOC~~ SOLN
40.0000 mg | SUBCUTANEOUS | Status: DC
  Administered 2014-10-19 – 2014-10-22 (×4): 40 mg via SUBCUTANEOUS
  Filled 2014-10-19 (×5): qty 0.4

## 2014-10-19 MED ORDER — ONDANSETRON HCL 4 MG/2ML IJ SOLN: 4.0000 mg | Freq: Four times a day (QID) | INTRAMUSCULAR | Status: DC | PRN

## 2014-10-19 MED ORDER — SODIUM CHLORIDE 0.9 % IJ SOLN: 10.0000 mL | Freq: Two times a day (BID) | INTRAMUSCULAR | Status: DC

## 2014-10-19 MED ORDER — SPIRONOLACTONE 12.5 MG HALF TABLET
12.5000 mg | ORAL_TABLET | Freq: Every day | ORAL | Status: DC
  Administered 2014-10-19 – 2014-10-22 (×4): 12.5 mg via ORAL
  Filled 2014-10-19 (×5): qty 1

## 2014-10-19 MED ORDER — SODIUM CHLORIDE 0.9 % IJ SOLN
10.0000 mL | INTRAMUSCULAR | Status: DC | PRN
  Administered 2014-10-23 (×2): 20 mL
  Filled 2014-10-19 (×2): qty 40

## 2014-10-19 MED ORDER — LISINOPRIL 5 MG PO TABS
5.0000 mg | ORAL_TABLET | Freq: Two times a day (BID) | ORAL | Status: DC
  Administered 2014-10-19 – 2014-10-20 (×3): 5 mg via ORAL
  Filled 2014-10-19 (×5): qty 1

## 2014-10-19 MED ORDER — ACETAMINOPHEN 325 MG PO TABS
650.0000 mg | ORAL_TABLET | ORAL | Status: DC | PRN
  Administered 2014-10-21: 650 mg via ORAL
  Filled 2014-10-19: qty 2

## 2014-10-19 MED ORDER — SODIUM CHLORIDE 0.9 % IV SOLN
250.0000 mL | INTRAVENOUS | Status: DC | PRN
Start: 2014-10-19 — End: 2014-10-23

## 2014-10-19 NOTE — Progress Notes (Signed)
Patient ID: Kristen Snyder, female   DOB: 01-25-1966, 48 y.o.   MRN: [id number]  PCP: Jackson Latino [facility]). Previously followed by Dr. Criss Rosales Primary Cardiologist: Stanford Breed  HPI:  [name] is a 48 y/o woman with DM, HTN and HL referred by Dr. Stanford Breed for further evaluation of systolic HF.   She developed dyspnea in 10/15. ECHO with EF 10-15% Saw Dr. Stanford Breed who scheduled R/L [facility] on 10/08/14 with Dr. Irish Lack.   Cath 10/08/14 Minimal CAD EF 15% with LVEDP 25 RA  7 RV 40/5/9 PA 43/23 (32) Unable to wedge PA sat 62% CO 4.9/2.7  Presents for initial evaluation in HF Clinic. Says she has had HTN for long time when seeing Dr. Criss Rosales but stopped seeing her in 2013 when she lost her job and had not meds since.  She says she has no idea what her BP was during that time. No taking any HF meds now due to swelling. Also doesn't have batter for her glucometer so not checking her sugars regularly. Has gained 15 pounds  Since her heart cath. Does not have scale at home. + DOE on mild to moderate exertion. + orthopnea. + edema. Snores heavily.    ROS: All systems negative except as listed in HPI, PMH and Problem List.  SH:  History   Social History  . Marital Status: Married    Spouse Name: [name]    Number of Children: 1  . Years of Education: [name]   Occupational History  .      [employer]   Social History Main Topics  . Smoking status: Never Smoker   . Smokeless tobacco: Not on file  . Alcohol Use: 0.0 oz/week    0 Not specified per week     Comment: Rare  . Drug Use: No  . Sexual Activity: Not on file   Other Topics Concern  . Not on file   Social History Narrative    FH:  Family History  Problem Relation Age of Onset  . Heart disease      No family history  . Diabetes Mother   . Hypertension Father     Past Medical History  Diagnosis Date  . Diabetes mellitus   . Hypertension   . Hyperlipidemia     Current Outpatient Prescriptions  Medication Sig  Dispense Refill  . aspirin EC 81 MG tablet Take 1 tablet (81 mg total) by mouth daily. 90 tablet 3  . carvedilol (COREG) 3.125 MG tablet Take 1 tablet (3.125 mg total) by mouth 2 (two) times daily. 180 tablet 3  . furosemide (LASIX) 20 MG tablet Take 2 tablets (40 mg total) by mouth daily. 90 tablet 3  . Insulin Glargine (LANTUS SOLOSTAR) 100 UNIT/ML Solostar Pen Inject 80 Units into the skin daily at 10 pm.    . lisinopril (PRINIVIL,ZESTRIL) 2.5 MG tablet Take 1 tablet (2.5 mg total) by mouth daily. 90 tablet 3   No current facility-administered medications for this encounter.    Filed Vitals:   10/19/14 1004  BP: 140/92  Pulse: 117  Weight: 188 lb (85.276 kg)  SpO2: 98%    PHYSICAL EXAM:  General:  Well appearing. No resp difficulty HEENT: normal Neck: supple. JVP to jaw Carotids 2+ bilaterally; no bruits. No lymphadenopathy or thryomegaly appreciated. Cor: PMI normal. Tachycardic. Regular. +s3 Lungs: clear Abdomen: soft, nontender, mildly distended. No hepatosplenomegaly. No bruits or masses. Good bowel sounds. Extremities: no cyanosis, clubbing, rash, 3+ edema Neuro: alert & orientedx3, cranial  nerves grossly intact. Moves all 4 extremities w/o difficulty. Affect pleasant.  ECG: ST 120. QRS 84ms  ASSESSMENT & PLAN: 1. Acute/chronic systolic HF due to NICM. Onset 10/15. EF 10-15% 2. HTN 3. DM2 4. Noncompliance 5. Snoring - probable OSA  She is markedly volume overloaded with prominent tachycardia and S3 on exam. Suspect this is HTN or sleep apnea related. I think best plan is to admit her for diuresis. Hold b-blocker now. Diurese with lasix 80 IV bid. Start lisinopril, digoxin and spiro. Will need extensive education and provided a scale while in hospital. Eventual sleep study  Christianne Zacher,MD 10:30 AM

## 2014-10-19 NOTE — H&P (Signed)
Advanced Heart Failure H&P   HPI: PCP: Jackson Latino [facility]). Previously followed by Dr. Criss Rosales Primary Cardiologist: Sullivan Blasing is a 48 y/o woman with DM, HTN and HL referred by Dr. Stanford Breed for further evaluation of systolic HF.   She developed dyspnea in 10/15. ECHO with EF 10-15% Saw Dr. Stanford Breed who scheduled R/L [facility] on 10/08/14 with Dr. Irish Lack.   Cath 10/08/14 Minimal CAD EF 15% with LVEDP 25 RA 7 RV 40/5/9 PA 43/23 (32) Unable to wedge PA sat 62% CO 4.9/2.7  Presents for initial evaluation in HF Clinic. Says she has had HTN for long time when seeing Dr. Criss Rosales but stopped seeing her in 2013 when she lost her job and had not meds since. She says she has no idea what her BP was during that time. No taking any HF meds now due to swelling. Also doesn't have batter for her glucometer so not checking her sugars regularly. Has gained 15 pounds Since her heart cath. Does not have scale at home. + DOE on mild to moderate exertion. + orthopnea. + edema. Snores heavily.     Review of Systems:   Cardiac Review of Systems: {Y] = yes [ ]  = no Chest Pain [ ]  Resting SOB [ ] Exertional SOB [Y ] Orthopnea Jazmín.Cullens ] Pedal Edema [ Y ] Palpitations [ ] Syncope [ ]  Presyncope [ ]  General Review of Systems: [Y] = yes [ ] =no Constitional: recent weight change [Y ]; anorexia [ ] ; fatigue [Y ]; nausea [ ] ; night sweats [ ] ; fever [ ] ; or chills [ ] ; Dental: poor dentition[ ] ;  Eye : blurred vision [ ] ; diplopia [ ] ; vision changes [ ] ; Amaurosis fugax[ ] ; Resp: cough [Y ]; wheezing[ ] ; hemoptysis[ ] ; shortness of breath[ ] ; paroxysmal nocturnal dyspnea[ ] ; dyspnea on exertion[ ] ; or orthopnea[ ] ;  GI: gallstones[ ] , vomiting[ ] ; dysphagia[ ] ; melena[ ] ; hematochezia [ ] ; heartburn[  ];  GU: kidney stones [ ] ; hematuria[ ] ; dysuria [ ] ; nocturia[ ] ;   Skin: rash [ ] , swelling[ ] ;, hair loss[ ] ; peripheral edema[ ] ; or itching[ ] ; Musculosketetal: myalgias[ ] ; joint swelling[ ] ; joint erythema[ ] ; joint pain[Y ]; back pain[ ] ; Heme/Lymph: bruising[ ] ; bleeding[ ] ; anemia[ ] ;  Neuro: TIA[ ] ; headaches[ ] ; stroke[ ] ; vertigo[ ] ; seizures[ ] ; paresthesias[ ] ; difficulty walking[ ] ; Psych:depression[ ] ; anxiety[ ] ; Endocrine: diabetes[ ] ; thyroid dysfunction[ ] ; Other:  Past Medical History  Diagnosis Date  . Diabetes mellitus   . Hypertension   . Hyperlipidemia     Prior to Admission medications   Medication Sig Start Date End Date Taking? Authorizing Provider  aspirin EC 81 MG tablet Take 1 tablet (81 mg total) by mouth daily. Patient not taking: Reported on 10/19/2014 09/28/14   Lelon Perla, MD  carvedilol (COREG) 3.125 MG tablet Take 1 tablet (3.125 mg total) by mouth 2 (two) times daily. Patient not taking: Reported on 10/19/2014 10/05/14   Lelon Perla, MD  furosemide (LASIX) 20 MG tablet Take 2 tablets (40 mg total) by mouth daily. Patient not taking: Reported on 10/19/2014 10/05/14   Lelon Perla, MD  Insulin Glargine (LANTUS SOLOSTAR) 100 UNIT/ML Solostar Pen Inject 80 Units into the skin daily at 10 pm.    Historical Provider, MD  lisinopril (PRINIVIL,ZESTRIL) 2.5 MG tablet Take 1 tablet (2.5 mg total) by mouth daily. Patient not taking: Reported on 10/19/2014 09/28/14   Lelon Perla, MD     No Known Allergies  History  Social History  . Marital Status: Married    Spouse Name: [name]    Number of Children: 1  . Years of Education: [name]   Occupational History  .      [employer]   Social History Main Topics  . Smoking status: Never Smoker   . Smokeless tobacco: Not on file   . Alcohol Use: 0.0 oz/week    0 Not specified per week     Comment: Rare  . Drug Use: No  . Sexual Activity: Not on file   Other Topics Concern  . Not on file   Social History Narrative    Family History  Problem Relation Age of Onset  . Heart disease      No family history  . Diabetes Mother   . Hypertension Father     PHYSICAL EXAM: There were no vitals filed for this visit. General: Well appearing. No resp difficulty HEENT: normal Neck: supple. JVP to jaw Carotids 2+ bilaterally; no bruits. No lymphadenopathy or thryomegaly appreciated. Cor: PMI normal. Tachycardic. Regular. +s3 Lungs: clear Abdomen: soft, nontender, mildly distended. No hepatosplenomegaly. No bruits or masses. Good bowel sounds. Extremities: no cyanosis, clubbing, rash, 3+ edema Neuro: alert & orientedx3, cranial nerves grossly intact. Moves all 4 extremities w/o difficulty. Affect pleasant.  ECG: ST 120. QRS 3ms  Lab Results Last 24 Hours    No results found for this or any previous visit (from the past 24 hour(s)).    Imaging Results (Last 48 hours)    No results found.     ASSESSMENT: 1. Acute/chronic systolic HF due to NICM. Onset 10/15. EF 10-15% 2. HTN 3. DM2 4. Noncompliance 5. Snoring - probable OSA  PLAN/DISCUSSION: She is markedly volume overloaded with prominent tachycardia and S3 on exam. Suspect this is HTN or sleep apnea related. I think best plan is to admit her for diuresis. Hold b-blocker now. Diurese with lasix 80 IV bid. Start lisinopril, digoxin and spiro. Will need extensive education and provided a scale while in hospital. Eventual sleep study  Admit to stepdown. Place PICC. Check CO-OX and CVP   Deltha Bernales,MD 11:21 AM

## 2014-10-19 NOTE — Progress Notes (Signed)
Peripherally Inserted Central Catheter/Midline Placement  The IV Nurse has discussed with the patient and/or persons authorized to consent for the patient, the purpose of this procedure and the potential benefits and risks involved with this procedure.  The benefits include less needle sticks, lab draws from the catheter and patient may be discharged home with the catheter.  Risks include, but not limited to, infection, bleeding, blood clot (thrombus formation), and puncture of an artery; nerve damage and irregular heat beat.  Alternatives to this procedure were also discussed.  PICC/Midline Placement Documentation  PICC / Midline Double Lumen 50/53/97 PICC Right Basilic 40 cm 3 cm (Active)  Indication for Insertion or Continuance of Line Vasoactive infusions 10/19/2014  4:25 PM  Exposed Catheter (cm) 3 cm 10/19/2014  4:25 PM  Site Assessment Clean;Dry;Intact 10/19/2014  4:25 PM  Lumen #1 Status Flushed;Saline locked;Blood return noted 10/19/2014  4:25 PM  Lumen #2 Status Flushed;Saline locked;Blood return noted 10/19/2014  4:25 PM  Dressing Type Transparent 10/19/2014  4:25 PM  Dressing Change Due 10/26/14 10/19/2014  4:25 PM       Gordan Payment 10/19/2014, 4:27 PM

## 2014-10-19 NOTE — Progress Notes (Signed)
12-4 1355p left sleep study form and wish scale form in shadow chart.will moniter for dc needs as pt progresses.

## 2014-10-20 ENCOUNTER — Encounter (HOSPITAL_COMMUNITY): Payer: Self-pay | Admitting: *Deleted

## 2014-10-20 DIAGNOSIS — I5023 Acute on chronic systolic (congestive) heart failure: Principal | ICD-10-CM

## 2014-10-20 DIAGNOSIS — I10 Essential (primary) hypertension: Secondary | ICD-10-CM

## 2014-10-20 LAB — BASIC METABOLIC PANEL
ANION GAP: 11 (ref 5–15)
BUN: 10 mg/dL (ref 6–23)
CO2: 25 mEq/L (ref 19–32)
Calcium: 8.2 mg/dL — ABNORMAL LOW (ref 8.4–10.5)
Chloride: 104 mEq/L (ref 96–112)
Creatinine, Ser: 0.67 mg/dL (ref 0.50–1.10)
GFR calc Af Amer: >90 mL/min (ref >90)
Glucose, Bld: 60 mg/dL — ABNORMAL LOW (ref 70–99)
POTASSIUM: 3.7 meq/L (ref 3.7–5.3)
SODIUM: 140 meq/L (ref 137–147)

## 2014-10-20 LAB — GLUCOSE, CAPILLARY
GLUCOSE-CAPILLARY: 98 mg/dL (ref 70–99)
GLUCOSE-CAPILLARY: 77 mg/dL (ref 70–99)
GLUCOSE-CAPILLARY: 163 mg/dL — AB (ref 70–99)
Glucose-Capillary: 75 mg/dL (ref 70–99)
Glucose-Capillary: 126 mg/dL — ABNORMAL HIGH (ref 70–99)

## 2014-10-20 LAB — CARBOXYHEMOGLOBIN
Carboxyhemoglobin: 1.2 % (ref 0.5–1.5)
Methemoglobin: 0.8 % (ref 0.0–1.5)
O2 SAT: 63.4 %
TOTAL HEMOGLOBIN: 10.1 g/dL — AB (ref 12.0–16.0)

## 2014-10-20 MED ORDER — ISOSORB DINITRATE-HYDRALAZINE 20-37.5 MG PO TABS
1.0000 | ORAL_TABLET | Freq: Three times a day (TID) | ORAL | Status: DC
  Administered 2014-10-20 (×3): 1 via ORAL
  Filled 2014-10-20 (×6): qty 1

## 2014-10-20 MED ORDER — POTASSIUM CHLORIDE CRYS ER 20 MEQ PO TBCR
40.0000 meq | EXTENDED_RELEASE_TABLET | Freq: Once | ORAL | Status: AC
  Administered 2014-10-20: 40 meq via ORAL

## 2014-10-20 NOTE — Progress Notes (Signed)
Patient ID: Kristen Snyder, female   DOB: Jan 31, 1966, 48 y.o.   MRN: [id number]   Kristen Snyder is a 48 y/o woman with DM, HTN and HL admitted with acute on chronic systolic CHF.  She developed dyspnea in 10/15. ECHO with EF 10-15% Saw Dr. Stanford Breed who scheduled R/L [facility] on 10/08/14 with Dr. Irish Lack.   Cath 10/08/14 Minimal CAD EF 15% with LVEDP 25 RA 7 RV 40/5/9 PA 43/23 (32) Unable to wedge PA sat 62% CO 4.9/2.7  Presented 12/4 for initial evaluation in HF Clinic. Says she has had HTN for long time when seeing Dr. Criss Rosales but stopped seeing her in 2013 when she lost her job and had not meds since. She says she has no idea what her BP was during that time.  Has gained 15 pounds since her heart cath. Does not have scale at home. + DOE on mild to moderate exertion. + orthopnea. + edema. Snores heavily.  She was admitted directly from clinic and started on IV Lasix.  PICC was placed.  She diuresed well overnight, weight is down.  BP remains high and she has mild sinus tachycardia.  CVP 11 this morning with co-ox 63%.   Scheduled Meds: . digoxin  0.125 mg Oral Daily  . enoxaparin (LOVENOX) injection  40 mg Subcutaneous Q24H  . furosemide  80 mg Intravenous BID  . insulin aspart  0-9 Units Subcutaneous TID WC  . insulin glargine  80 Units Subcutaneous QHS  . isosorbide-hydrALAZINE  1 tablet Oral TID  . lisinopril  5 mg Oral BID  . spironolactone  12.5 mg Oral Daily   Continuous Infusions:  PRN Meds:.sodium chloride, acetaminophen, ondansetron (ZOFRAN) IV, sodium chloride, sodium chloride    Filed Vitals:   10/20/14 0343 10/20/14 0400 10/20/14 0500 10/20/14 0600  BP:  134/98 128/109 131/109  Pulse:  105 92 42  Temp: 98.3 F (36.8 C)     TempSrc: Oral     Resp:  27 26   Height:      Weight: 179 lb 0.2 oz (81.2 kg)     SpO2:  97% 100% 98%    Intake/Output Summary (Last 24 hours) at 10/20/14 0631 Last data filed at 10/20/14 0400  Gross per 24 hour  Intake    850 ml  Output    3150 ml  Net  -2300 ml    LABS: Basic Metabolic Panel:  Recent Labs  10/19/14 1518 10/20/14 0430  NA 139 140  K 4.3 3.7  CL 104 104  CO2 21 25  GLUCOSE 128* 60*  BUN 13 10  CREATININE 0.72 0.67  CALCIUM 8.9 8.2*  MG 2.0  --    Liver Function Tests:  Recent Labs  10/19/14 1518  AST 35  ALT 52*  ALKPHOS 83  BILITOT 0.4  PROT 6.0  ALBUMIN 3.1*   No results for input(s): LIPASE, AMYLASE in the last 72 hours. CBC:  Recent Labs  10/19/14 1518  WBC 6.5  NEUTROABS 3.7  HGB 10.8*  HCT 35.1*  MCV 62.3*  PLT 403*   Cardiac Enzymes: No results for input(s): CKTOTAL, CKMB, CKMBINDEX, TROPONINI in the last 72 hours. BNP: Invalid input(s): POCBNP D-Dimer: No results for input(s): DDIMER in the last 72 hours. Hemoglobin A1C: No results for input(s): HGBA1C in the last 72 hours. Fasting Lipid Panel: No results for input(s): CHOL, HDL, LDLCALC, TRIG, CHOLHDL, LDLDIRECT in the last 72 hours. Thyroid Function Tests: No results for input(s): TSH, T4TOTAL, T3FREE, THYROIDAB in the last  72 hours.  Invalid input(s): FREET3 Anemia Panel: No results for input(s): VITAMINB12, FOLATE, FERRITIN, TIBC, IRON, RETICCTPCT in the last 72 hours.  RADIOLOGY: No results found.  PHYSICAL EXAM General: NAD Neck: JVP 12-14 cm, no thyromegaly or thyroid nodule.  Lungs: Clear to auscultation bilaterally with normal respiratory effort. CV: Lateral PMI.  Heart mildly tachy S1/S2, +S3, no murmur. 1+ edema to knees.   Abdomen: Soft, nontender, no hepatosplenomegaly, no distention.  Neurologic: Alert and oriented x 3.  Psych: Normal affect. Extremities: No clubbing or cyanosis.   TELEMETRY: Reviewed telemetry pt in ST in 100s  ASSESSMENT AND PLAN: 48 yo with history of HTN and nonischemic cardiomyopathy was admitted with acute on chronic systolic CHF. 1. Acute on chronic systolic: Nonischemic cardiomyopathy.  EF 10-15% by echo.  Suspect this is related to uncontrolled HTN x years.   She remains volume overloaded on exam.  CVP elevated, co-ox adequate at 63%.   - Continue digoxin, lisinopril, spironolactone.  - Add Bidil 1 tab tid - Continue current IV Lasix, good diuresis. - When more dry, add Coreg.  2. HTN: As above, adding Bidil.  BP still running high.   Kristen Snyder 10/20/2014 6:38 AM

## 2014-10-20 NOTE — Progress Notes (Signed)
CARDIAC REHAB PHASE I   PRE:  Rate/Rhythm: 110 ST    BP: sitting 133/95    SaO2: 100 RA  MODE:  Ambulation: 550 ft   POST:  Rate/Rhythm: 127 ST with PACs    BP: sitting 183/83     SaO2: 100 RA  Pt thankful to walk. One lap without stopping but then became SOB and had to stop to rest, HR 127. Able to walk another half lap. Gave HF booklet and discussed indepth. Pt is slowly accepting information. Gave HF video to watch. Will continue to follow. [id number]   Josephina Shih [place] CES, ACSM 10/20/2014 12:15 PM

## 2014-10-20 NOTE — Plan of Care (Signed)
Problem: Consults Goal: Diabetes Guidelines if Diabetic/Glucose > 140 If diabetic or lab glucose is > 140 mg/dl - Initiate Diabetes/Hyperglycemia Guidelines & Document Interventions  Outcome: Progressing  Problem: Phase I Progression Outcomes Goal: Dyspnea controlled at rest (HF) Outcome: Completed/Met Date Met:  10/20/14 Goal: Pain controlled with appropriate interventions Outcome: Completed/Met Date Met:  10/20/14 Goal: EF % per last Echo/documented,Core Reminder form on chart Outcome: Completed/Met Date Met:  10/20/14 Goal: Up in chair, BRP Outcome: Progressing Goal: Initial discharge plan identified Outcome: Progressing Goal: Voiding-avoid urinary catheter unless indicated Outcome: Completed/Met Date Met:  10/20/14 Goal: Hemodynamically stable Outcome: Progressing  Problem: Phase II Progression Outcomes Goal: Pain controlled Outcome: Completed/Met Date Met:  10/20/14 Goal: Dyspnea controlled with activity Outcome: Progressing

## 2014-10-21 LAB — GLUCOSE, CAPILLARY
GLUCOSE-CAPILLARY: 97 mg/dL (ref 70–99)
Glucose-Capillary: 147 mg/dL — ABNORMAL HIGH (ref 70–99)
Glucose-Capillary: 195 mg/dL — ABNORMAL HIGH (ref 70–99)
Glucose-Capillary: 194 mg/dL — ABNORMAL HIGH (ref 70–99)

## 2014-10-21 LAB — BASIC METABOLIC PANEL
Anion gap: 10 (ref 5–15)
BUN: 11 mg/dL (ref 6–23)
CALCIUM: 8.4 mg/dL (ref 8.4–10.5)
CO2: 25 mEq/L (ref 19–32)
CREATININE: 0.78 mg/dL (ref 0.50–1.10)
Chloride: 106 mEq/L (ref 96–112)
Glucose, Bld: 70 mg/dL (ref 70–99)
Potassium: 4.2 mEq/L (ref 3.7–5.3)
Sodium: 141 mEq/L (ref 137–147)

## 2014-10-21 LAB — CARBOXYHEMOGLOBIN
Carboxyhemoglobin: 1.4 % (ref 0.5–1.5)
Methemoglobin: 0.9 % (ref 0.0–1.5)
O2 Saturation: 88.5 %
Total hemoglobin: 9.8 g/dL — ABNORMAL LOW (ref 12.0–16.0)

## 2014-10-21 MED ORDER — FUROSEMIDE 80 MG PO TABS
80.0000 mg | ORAL_TABLET | Freq: Two times a day (BID) | ORAL | Status: DC
  Administered 2014-10-21 (×2): 80 mg via ORAL
  Filled 2014-10-21 (×5): qty 1

## 2014-10-21 MED ORDER — LISINOPRIL 20 MG PO TABS
20.0000 mg | ORAL_TABLET | Freq: Two times a day (BID) | ORAL | Status: DC
  Administered 2014-10-21 – 2014-10-23 (×5): 20 mg via ORAL
  Filled 2014-10-21 (×7): qty 1

## 2014-10-21 MED ORDER — CARVEDILOL 3.125 MG PO TABS
3.1250 mg | ORAL_TABLET | Freq: Two times a day (BID) | ORAL | Status: DC
  Administered 2014-10-21 (×2): 3.125 mg via ORAL
  Filled 2014-10-21 (×5): qty 1

## 2014-10-21 NOTE — Progress Notes (Signed)
Educated patient on heart failure pathway. Diet, sodium restrictions and fluid restriction, importance of checking weight daily and taking prescribed medications. Also provided heart failure booklet and printouts of patient education. She verbalizes understanding and states that she is ready to make a change. After speaking to her husband and daughter who state they are Jehova's witnesses, it seems that there may be an ethical dilemma and I suspect they are preventing her from following medical advice. I notified weekend SW and she left message for weekday social worker to address this with patient.

## 2014-10-21 NOTE — Progress Notes (Addendum)
Patient ID: Kristen Snyder, female   DOB: 02/26/1966, 48 y.o.   MRN: [id number]   Senna is a 48 y/o woman with DM, HTN and HL admitted with acute on chronic systolic CHF.  She developed dyspnea in 10/15. ECHO with EF 10-15% Saw Dr. Stanford Breed who scheduled R/L [facility] on 10/08/14 with Dr. Irish Lack.   Cath 10/08/14 Minimal CAD EF 15% with LVEDP 25 RA 7 RV 40/5/9 PA 43/23 (32) Unable to wedge PA sat 62% CO 4.9/2.7  Presented 12/4 for initial evaluation in HF Clinic. Says she has had HTN for long time when seeing Dr. Criss Rosales but stopped seeing her in 2013 when she lost her job and had not meds since. She says she has no idea what her BP was during that time.  Has gained 15 pounds since her heart cath. Does not have scale at home. + DOE on mild to moderate exertion. + orthopnea. + edema. Snores heavily.  She was admitted directly from clinic and started on IV Lasix.  PICC was placed.  Continues to diurese well. Breathing better. CVP 8-9. Co-ox 89% (inaccurate). Weights unreliable.   Scheduled Meds: . digoxin  0.125 mg Oral Daily  . enoxaparin (LOVENOX) injection  40 mg Subcutaneous Q24H  . furosemide  80 mg Intravenous BID  . insulin aspart  0-9 Units Subcutaneous TID WC  . insulin glargine  80 Units Subcutaneous QHS  . isosorbide-hydrALAZINE  1 tablet Oral TID  . lisinopril  5 mg Oral BID  . spironolactone  12.5 mg Oral Daily   Continuous Infusions:  PRN Meds:.sodium chloride, acetaminophen, ondansetron (ZOFRAN) IV, sodium chloride, sodium chloride    Filed Vitals:   10/21/14 0400 10/21/14 0500 10/21/14 0600 10/21/14 0700  BP: 101/54 108/71 117/77 126/89  Pulse: 107 107 109 110  Temp:      TempSrc:      Resp:      Height:      Weight:      SpO2: 94% 98% 100% 98%    Intake/Output Summary (Last 24 hours) at 10/21/14 0804 Last data filed at 10/21/14 0200  Gross per 24 hour  Intake    880 ml  Output   3450 ml  Net  -2570 ml    LABS: Basic Metabolic Panel:  Recent  Labs  10/19/14 1518 10/20/14 0430 10/21/14 0530  NA 139 140 141  K 4.3 3.7 4.2  CL 104 104 106  CO2 21 25 25   GLUCOSE 128* 60* 70  BUN 13 10 11   CREATININE 0.72 0.67 0.78  CALCIUM 8.9 8.2* 8.4  MG 2.0  --   --    Liver Function Tests:  Recent Labs  10/19/14 1518  AST 35  ALT 52*  ALKPHOS 83  BILITOT 0.4  PROT 6.0  ALBUMIN 3.1*   No results for input(s): LIPASE, AMYLASE in the last 72 hours. CBC:  Recent Labs  10/19/14 1518  WBC 6.5  NEUTROABS 3.7  HGB 10.8*  HCT 35.1*  MCV 62.3*  PLT 403*   Cardiac Enzymes: No results for input(s): CKTOTAL, CKMB, CKMBINDEX, TROPONINI in the last 72 hours. BNP: Invalid input(s): POCBNP D-Dimer: No results for input(s): DDIMER in the last 72 hours. Hemoglobin A1C: No results for input(s): HGBA1C in the last 72 hours. Fasting Lipid Panel: No results for input(s): CHOL, HDL, LDLCALC, TRIG, CHOLHDL, LDLDIRECT in the last 72 hours. Thyroid Function Tests: No results for input(s): TSH, T4TOTAL, T3FREE, THYROIDAB in the last 72 hours.  Invalid input(s): FREET3 Anemia  Panel: No results for input(s): VITAMINB12, FOLATE, FERRITIN, TIBC, IRON, RETICCTPCT in the last 72 hours.  RADIOLOGY: No results found.  PHYSICAL EXAM General: NAD Neck: JVP 8-9 cm, no thyromegaly or thyroid nodule.  Lungs: Clear to auscultation bilaterally with normal respiratory effort. CV: Lateral PMI.  Heart mildly tachy S1/S2, +S3, no murmur. No edema Abdomen: Soft, nontender, no hepatosplenomegaly, no distention.  Neurologic: Alert and oriented x 3.  Psych: Normal affect. Extremities: No clubbing or cyanosis.   TELEMETRY: Reviewed telemetry pt in ST in 110s  ASSESSMENT AND PLAN: 48 yo with history of HTN and nonischemic cardiomyopathy was admitted with acute on chronic systolic CHF. 1. Acute on chronic systolic: Nonischemic cardiomyopathy.  EF 10-15% by echo.  Suspect this is related to uncontrolled HTN x years.  Volume status much  Better. Co-oc  improved though she remains very tachycardic. - Switch to po lasix - Continue digoxin, spironolactone.  - Bidil added yesterday. She cannot afford. So will stop for now. Titrate lisinopril to 20 bid. - Add carvedilol 3.125 bid 2. HTN: Improving 3. Cognitive function: She seems to have a very poor understanding of her medicines and doesn't seem to remember what was told to her. I am concerned about some cognitive limitations in her care. Will d/w SW.   Dispo can go to tele. Possibly home in am.   Glori Bickers  MD  10/21/2014 8:04 AM

## 2014-10-22 ENCOUNTER — Encounter (HOSPITAL_COMMUNITY): Payer: Self-pay | Admitting: Anesthesiology

## 2014-10-22 LAB — BASIC METABOLIC PANEL
ANION GAP: 14 (ref 5–15)
BUN: 8 mg/dL (ref 6–23)
CO2: 27 mEq/L (ref 19–32)
Calcium: 8.7 mg/dL (ref 8.4–10.5)
Chloride: 102 mEq/L (ref 96–112)
Creatinine, Ser: 0.72 mg/dL (ref 0.50–1.10)
GFR calc Af Amer: >90 mL/min (ref >90)
GFR calc non Af Amer: >90 mL/min (ref >90)
GLUCOSE: 51 mg/dL — AB (ref 70–99)
POTASSIUM: 3.8 meq/L (ref 3.7–5.3)
SODIUM: 143 meq/L (ref 137–147)

## 2014-10-22 LAB — GLUCOSE, CAPILLARY
GLUCOSE-CAPILLARY: 218 mg/dL — AB (ref 70–99)
Glucose-Capillary: 55 mg/dL — ABNORMAL LOW (ref 70–99)
Glucose-Capillary: 81 mg/dL (ref 70–99)
Glucose-Capillary: 124 mg/dL — ABNORMAL HIGH (ref 70–99)
Glucose-Capillary: 149 mg/dL — ABNORMAL HIGH (ref 70–99)

## 2014-10-22 MED ORDER — FUROSEMIDE 80 MG PO TABS
80.0000 mg | ORAL_TABLET | Freq: Every day | ORAL | Status: DC
  Administered 2014-10-22 – 2014-10-23 (×2): 80 mg via ORAL
  Filled 2014-10-22 (×2): qty 1

## 2014-10-22 MED ORDER — CARVEDILOL 6.25 MG PO TABS
6.2500 mg | ORAL_TABLET | Freq: Two times a day (BID) | ORAL | Status: DC
  Administered 2014-10-22 – 2014-10-23 (×3): 6.25 mg via ORAL
  Filled 2014-10-22 (×5): qty 1

## 2014-10-22 NOTE — Progress Notes (Signed)
Heart Failure Navigator Consult Note  Presentation: Kristen Snyder presents for initial evaluation in HF Clinic. Says she has had HTN for long time when seeing Kristen Snyder but stopped seeing her in 2013 when she lost her job and had not meds since. She says she has no idea what her BP was during that time. No taking any HF meds now due to swelling. Also doesn't have batter for her glucometer so not checking her sugars regularly. Has gained 15 pounds Since her heart cath. Does not have scale at home. + DOE on mild to moderate exertion. + orthopnea. + edema. Snores heavily.   Past Medical History  Diagnosis Date  . Diabetes mellitus   . Hypertension   . Hyperlipidemia   . NICM (nonischemic cardiomyopathy)     a) LHC (10/08/14) Normal coronaries, mild plaque in mid LAD, EF 15%  . Chronic systolic heart failure     a) RHC (10/08/14): RA: 7, RV: 40/5, RVEDP: 9, PA: 43/23 (32), unable wedge, PA: 62%, CO/CI: 4.9/ 2.69; b) Echo (10/05/2014): EF 10-15%, mild MR directed centrally    History   Social History  . Marital Status: Married    Spouse Name: [name]    Number of Children: 1  . Years of Education: [name]   Occupational History  .      [employer]   Social History Main Topics  . Smoking status: Never Smoker   . Smokeless tobacco: None  . Alcohol Use: 0.0 oz/week    0 Not specified per week     Comment: Rare  . Drug Use: No  . Sexual Activity: None   Other Topics Concern  . None   Social History Narrative    ECHO:Study Conclusions-10/05/14  - Left ventricle: The cavity size was mildly dilated. Systolic function was severely reduced. The estimated ejection fraction was in the range of 10% to 15%. Severe diffuse hypokinesis with no identifiable regional variations. Doppler parameters are consistent with restrictive physiology, indicative of decreased left ventricular diastolic compliance and/or increased left atrial pressure. - Mitral valve: There was mild  regurgitation directed centrally. - Left atrium: The atrium was mildly dilated. - Atrial septum: No defect or patent foramen ovale was identified. - Pulmonary arteries: Systolic pressure was moderately increased. PA peak pressure: 49 mm Hg (S).  Transthoracic echocardiography. M-mode, complete 2D, spectral Doppler, and color Doppler. Birthdate: Patient birthdate: 02-19-1966. Age: Patient is 48 yr old. Sex: Gender: female. BMI: 29.7 kg/m^2. Blood pressure:   110/90 Patient status: Outpatient. Study date: Study date: 10/05/2014. Study time: 02:16 PM. Location: Echo laboratory. BNP    Component Value Date/Time   PROBNP 1662.0* 10/19/2014 1518    Education Assessment and Provision:  Detailed education and instructions provided on heart failure disease management including the following:  Signs and symptoms of Heart Failure When to call the physician Importance of daily weights Low sodium diet Fluid restriction Medication management Anticipated future follow-up appointments  Patient education given on each of the above topics.  Patient acknowledges understanding and acceptance of all instructions.  I spoke for some time with [name] regarding her HF diagnosis.  According to her -she has come to a better understanding of the "total picture".  She does admit that prior to her appt.  on Friday in the HF clinic she thought that the new medications she had recently been prescribed including Lasix were making her symptoms worse and therefore she was not taking anything.  I explained the importance of taking  medications as prescribed.  We also discussed the importance of daily weights and when to call the physician.  I have provided her with a scale to use for daily weights at home.  She was able to teach back topics listed above.  She denies issues with affording medications.  She is currently working with a temp agency.  Education Materials:  "Living Better With Heart  Failure" Booklet, Daily Weight Tracker Tool.  High Risk Criteria for Readmission and/or Poor Patient Outcomes:  (Recommend Follow-up with Advanced Heart Failure Clinic)--yes   EF <30%- Yes-15%  2 or more admissions in 6 months- No  Difficult social situation- No-lives with husband and 32 year old daughter  Demonstrates medication noncompliance- Yes--however had not received HF education prior    Barriers of Care:  Knowledge and compliance  Discharge Planning:   Plans to discharge to home with husband and 22 year old daughter.  She will need ongoing education.  She has a hospital follow-up on 10/30/14 at 0945.

## 2014-10-22 NOTE — Progress Notes (Signed)
Patient's blood sugar this morning was 55. Patient given graham crackers with peanut butter, and orange juice with two packs of sugar. Recheck of blood sugar is now 88. Patient reports that she feels better. Will continue to monitor.  Esperanza Heir, RN

## 2014-10-22 NOTE — Progress Notes (Signed)
Patient is sleeping peacefully. Denies c/o pain/shob. Maintained on telemetry and is Sinus tach on the monitor with a heart rate of 120 . Will continue to monitor.  Esperanza Heir, RN

## 2014-10-22 NOTE — Progress Notes (Signed)
Patient ID: Kristen Snyder, female   DOB: Mar 10, 1966, 48 y.o.   MRN: [id number]   Kristen Snyder is a 48 y/o woman with DM, HTN and HL admitted with acute on chronic systolic CHF.  She developed dyspnea in 10/15. ECHO with EF 10-15% Saw Dr. Stanford Breed who scheduled R/L [facility] on 10/08/14 with Dr. Irish Lack.   Cath 10/08/14 Minimal CAD EF 15% with LVEDP 25 RA 7 RV 40/5/9 PA 43/23 (32) Unable to wedge PA sat 62% CO 4.9/2.7  Presented 12/4 for initial evaluation in HF Clinic. Says she has had HTN for long time when seeing Dr. Criss Rosales but stopped seeing her in 2013 when she lost her job and had not meds since. She says she has no idea what her BP was during that time.  Has gained 15 pounds since her heart cath. Does not have scale at home. + DOE on mild to moderate exertion. + orthopnea. + edema. Snores heavily.  She was admitted directly from clinic and started on IV Lasix.  PICC was placed.  Weight down 8 lbs, 24 hr I/O -3.9 liters. Denies SOB, orthopnea or CP. Ambulating in the halls.   Scheduled Meds: . carvedilol  3.125 mg Oral BID WC  . digoxin  0.125 mg Oral Daily  . enoxaparin (LOVENOX) injection  40 mg Subcutaneous Q24H  . furosemide  80 mg Oral BID  . insulin aspart  0-9 Units Subcutaneous TID WC  . insulin glargine  80 Units Subcutaneous QHS  . lisinopril  20 mg Oral BID  . spironolactone  12.5 mg Oral Daily   Continuous Infusions:  PRN Meds:.sodium chloride, acetaminophen, ondansetron (ZOFRAN) IV, sodium chloride, sodium chloride    Filed Vitals:   10/21/14 1137 10/21/14 2031 10/22/14 0235 10/22/14 0610  BP: 121/85 130/86 100/65 125/86  Pulse: 114 118 100 100  Temp: 98.4 F (36.9 C) 97.9 F (36.6 C) 98.2 F (36.8 C) 97.4 F (36.3 C)  TempSrc: Oral Oral Oral Oral  Resp: 18 16 17 18   Height: 5\' 4"  (1.626 m)     Weight: 175 lb 4.3 oz (79.5 kg)   167 lb 8.8 oz (76 kg)  SpO2: 97% 98% 95% 97%    Intake/Output Summary (Last 24 hours) at 10/22/14 0948 Last data filed at  10/22/14 0914  Gross per 24 hour  Intake   1160 ml  Output   5100 ml  Net  -3940 ml    LABS: Basic Metabolic Panel:  Recent Labs  10/19/14 1518  10/21/14 0530 10/22/14 0440  NA 139  < > 141 143  K 4.3  < > 4.2 3.8  CL 104  < > 106 102  CO2 21  < > 25 27  GLUCOSE 128*  < > 70 51*  BUN 13  < > 11 8  CREATININE 0.72  < > 0.78 0.72  CALCIUM 8.9  < > 8.4 8.7  MG 2.0  --   --   --   < > = values in this interval not displayed. Liver Function Tests:  Recent Labs  10/19/14 1518  AST 35  ALT 52*  ALKPHOS 83  BILITOT 0.4  PROT 6.0  ALBUMIN 3.1*   No results for input(s): LIPASE, AMYLASE in the last 72 hours. CBC:  Recent Labs  10/19/14 1518  WBC 6.5  NEUTROABS 3.7  HGB 10.8*  HCT 35.1*  MCV 62.3*  PLT 403*   Cardiac Enzymes: No results for input(s): CKTOTAL, CKMB, CKMBINDEX, TROPONINI in the last 72 hours.  BNP: Invalid input(s): POCBNP D-Dimer: No results for input(s): DDIMER in the last 72 hours. Hemoglobin A1C: No results for input(s): HGBA1C in the last 72 hours. Fasting Lipid Panel: No results for input(s): CHOL, HDL, LDLCALC, TRIG, CHOLHDL, LDLDIRECT in the last 72 hours. Thyroid Function Tests: No results for input(s): TSH, T4TOTAL, T3FREE, THYROIDAB in the last 72 hours.  Invalid input(s): FREET3 Anemia Panel: No results for input(s): VITAMINB12, FOLATE, FERRITIN, TIBC, IRON, RETICCTPCT in the last 72 hours.  RADIOLOGY: No results found.  PHYSICAL EXAM General: NAD, sitting up in bed Neck: JVP 7 cm, no thyromegaly or thyroid nodule.  Lungs: Clear to auscultation bilaterally with normal respiratory effort. CV: Lateral PMI.  Heart mildly tachy S1/S2, , no murmur. No edema, TED hose intact Abdomen: Soft, nontender, no hepatosplenomegaly, no distention.  Neurologic: Alert and oriented x 3.  Psych: Normal affect. Extremities: No clubbing or cyanosis.   TELEMETRY: Reviewed telemetry pt in ST in 110s  ASSESSMENT AND PLAN: 48 yo with history of  HTN and nonischemic cardiomyopathy was admitted with acute on chronic systolic CHF. 1. Acute on chronic systolic: Nonischemic cardiomyopathy.  EF 10-15% by echo.  Suspect this is related to uncontrolled HTN x years.   - Weight down another 8 lbs and volume status looks good. Will change to lasix 80 mg daily.  - Increase coreg to 6.25 mg BID and follow closely.  - Continue digoxin, spironolactone and lisinopril at current doses. Renal function stable. Will need to check digoxin level on OP side.  2. HTN: stable.  3. Cognitive function: Dr. Haroldine Laws concerned about poor understanding of her medicines and doesn't seem to remember what was told to her. She had extensive education about HF yesterday and could relay information about daily weights, sodium and fluid restriction and taking medications daily. She could also state some of her home medications. Will continue to follow closely.   Hopefully home tomorrow.   Rande Brunt NP-C 10/22/2014 9:48 AM  Patient seen and examined with Junie Bame, NP. We discussed all aspects of the encounter. I agree with the assessment and plan as stated above.   She continues to improve. Volume status better. Agree with changing lasix to 80 daily. Titrate carvedilol. Possibly home tomorrow. Ongoing extensive HF education performed discussing meds and ability to work and possible natural history of HF.  Daniel Bensimhon,MD 9:58 AM

## 2014-10-22 NOTE — Discharge Summary (Signed)
Advanced Heart Failure Team  Discharge Summary   Patient ID: Kristen Snyder MRN: [id number], DOB/AGE: 08-02-1966 48 y.o. Admit date: 10/19/2014 D/C date:     10/23/2014   Primary Discharge Diagnoses:  1) A/C systolic HF - EF 21-30%, mild MR (09/2014) - Diuresed with IV lasix. Diuresed 19 pounds. Discharge weight 169 pounds.   Secondary Discharge Diagnoses:  1) HTN 2) NICM 3) DM2 4) Non-compliance 5) Snoring - probable OSA   Hospital Course:  Kristen Snyder is a 48 yo female with a history of NICM, chronic systolic HF, LLD, HTN, DM2 and non-compliance.  She was referred to the HF clinic from Dr. Stanford Breed and was seen in the clinic on 10/19/14. She had marked volume overload and was admitted for diuresis. She was started on lasix 80 mg IV BID with brisk diuresis. A PICC was placed in order to obtain CVPs and co-ox's and her initial CVP was 11-14 and co-ox was 63%. She continued to diurese and her renal function increased slightly and she was switched to PO diuretics. Her HF medications were titrated and it was decided at the time to not use Bidil d/t cost. If she needs increased afterload reduction we can add hydralazine and Imdur on the OP side. CR was consulted and she was ambulating in the halls with no SOB, orthopnea or CP.  She had extensive HF education and we discussed her condition at length. She will need continued support and reinforcement for education. She was able to report on discharge what she needed to do to help assist with her condition such as taking medications daily, following a low salt diet, restricting fluids to less than 2L and to call the clinic if she developed increased symptoms or her weight trended up.  She will be followed closely in the HF clinic with continued titration of medications. She will need to be set up for an OP sleep study to assess for OSA. Her follow up appointment will be Tuesday December 11 @ 9:15. Plan to check BMET and digoxin level. Her  discharge weight on day of discharged was 169 pounds.        Discharge Weight Range: 169 pounds  Discharge Vitals: Blood pressure 123/84, pulse 100, temperature 97.6 F (36.4 C), temperature source Oral, resp. rate 18, height 5\' 4"  (1.626 m), weight 169 lb (76.658 kg), last menstrual period 12/19/2011, SpO2 99 %.  Labs: Lab Results  Component Value Date   WBC 6.5 10/19/2014   HGB 10.8* 10/19/2014   HCT 35.1* 10/19/2014   MCV 62.3* 10/19/2014   PLT 403* 10/19/2014    Recent Labs Lab 10/19/14 1518  10/23/14 0840  NA 139  < > 143  K 4.3  < > 4.5  CL 104  < > 104  CO2 21  < > 28  BUN 13  < > 9  CREATININE 0.72  < > 0.63  CALCIUM 8.9  < > 8.7  PROT 6.0  --   --   BILITOT 0.4  --   --   ALKPHOS 83  --   --   ALT 52*  --   --   AST 35  --   --   GLUCOSE 128*  < > 76  < > = values in this interval not displayed. Lab Results  Component Value Date   CHOL 161 10/05/2014   HDL 63 10/05/2014   LDLCALC 78 10/05/2014   TRIG 98 10/05/2014   BNP (last 3 results)  Recent Labs  10/19/14 1518  PROBNP 1662.0*    Diagnostic Studies/Procedures   No results found.  Discharge Medications     Medication List    TAKE these medications        aspirin EC 81 MG tablet  Take 1 tablet (81 mg total) by mouth daily.     carvedilol 6.25 MG tablet  Commonly known as:  COREG  Take 1 tablet (6.25 mg total) by mouth 2 (two) times daily with a meal.     digoxin 0.125 MG tablet  Commonly known as:  LANOXIN  Take 1 tablet (0.125 mg total) by mouth daily.     furosemide 80 MG tablet  Commonly known as:  LASIX  Take 1 tablet (80 mg total) by mouth daily. May take an extra 80 mg for 3 pound weight gain in 24 hours     Insulin Glargine 100 UNIT/ML Solostar Pen  Commonly known as:  LANTUS SOLOSTAR  Inject 70 Units into the skin daily at 10 pm.     lisinopril 20 MG tablet  Commonly known as:  PRINIVIL,ZESTRIL  Take 1 tablet (20 mg total) by mouth 2 (two) times daily.      spironolactone 25 MG tablet  Commonly known as:  ALDACTONE  Take 1 tablet (25 mg total) by mouth daily.        Disposition   The patient will be discharged in stable condition to home. Discharge Instructions    ACE Inhibitor / ARB already ordered    Complete by:  As directed      Amb Referral to Cardiac Rehabilitation    Complete by:  As directed      Diet - low sodium heart healthy    Complete by:  As directed      Heart Failure patients record your daily weight using the same scale at the same time of day    Complete by:  As directed      Increase activity slowly    Complete by:  As directed           Follow-up Information    Follow up with Glori Bickers, MD On 10/26/2014.   Specialty:  Cardiology   Why:  at [place] information:   [address] [place] [id number] [phone]         Duration of Discharge Encounter: Greater than 35 minutes   Signed, CLEGG,AMY NP-C  10/23/2014, 9:34 AM  Patient seen and examined with Darrick Grinder, NP. We discussed all aspects of the encounter. I agree with the assessment and plan as stated above.   She is improved. Can go home today. Extensive HF teaching performed in hospital. Will need close f/u in HF Clinic.  Yan Okray,MD 11:39 AM

## 2014-10-22 NOTE — Progress Notes (Signed)
CARDIAC REHAB PHASE I   PRE:  Rate/Rhythm: 110 ST    BP: sitting 129/85    SaO2:   MODE:  Ambulation: 1830 ft   POST:  Rate/Rhythm: 126 ST    BP: sitting 123/87     SaO2: 98 RA  Tolerated well. Pt able to talk and walk long distance. No rest needed. Not SOB. HR up to 126 ST. Further ed completed, more details discussed re low sodium. Encouraged to read more today, share with family and watch video. Pt is slowly taking her diagnosis in. Encouraged her to have good support at home and continually evaluate how she is doing.  [place], Silas, ACSM 10/22/2014 10:56 AM

## 2014-10-22 NOTE — Care Management Note (Signed)
    Page 1 of 1   10/22/2014     5:04:04 PM CARE MANAGEMENT NOTE 10/22/2014  Patient:  Kristen Snyder, Kristen Snyder   Account Number:  [id number]  Date Initiated:  10/21/2014  Documentation initiated by:  Elissa Hefty  Subjective/Objective Assessment:   adm w heart failure     Action/Plan:   pcp dr Lin Landsman, lives w fam   Anticipated DC Date:  10/23/2014   Anticipated DC Plan:  [place]  CM consult  [facility] Program Scales      Choice offered to / List presented to:             Status of service:  In process, will continue to follow Medicare Important Message given?   (If response is "NO", the following Medicare IM given date fields will be blank) Date Medicare IM given:   Medicare IM given by:   Date Additional Medicare IM given:   Additional Medicare IM given by:    Discharge Disposition:    Per UR Regulation:  Reviewed for med. necessity/level of care/duration of stay  If discussed at [place] of Stay Meetings, dates discussed:    Comments:  10/22/14 Ashley Murrain, BSN [phone] Pt to follow up with [facility] CHF clinic at discharge. She has follow up appt for 10/30/14.  12/6 1937T debbie dowell rn,bsn left wish scale form and  sleep study form on shadow chart. will follow and assist as pt progresses.

## 2014-10-23 LAB — BASIC METABOLIC PANEL
Anion gap: 11 (ref 5–15)
BUN: 9 mg/dL (ref 6–23)
CALCIUM: 8.7 mg/dL (ref 8.4–10.5)
CHLORIDE: 104 meq/L (ref 96–112)
CO2: 28 mEq/L (ref 19–32)
Creatinine, Ser: 0.63 mg/dL (ref 0.50–1.10)
GFR calc Af Amer: >90 mL/min (ref >90)
GFR calc non Af Amer: >90 mL/min (ref >90)
Glucose, Bld: 76 mg/dL (ref 70–99)
Potassium: 4.5 mEq/L (ref 3.7–5.3)
SODIUM: 143 meq/L (ref 137–147)

## 2014-10-23 LAB — CARBOXYHEMOGLOBIN
CARBOXYHEMOGLOBIN: 1.3 % (ref 0.5–1.5)
METHEMOGLOBIN: 0.8 % (ref 0.0–1.5)
O2 Saturation: 79.3 %
TOTAL HEMOGLOBIN: 10.4 g/dL — AB (ref 12.0–16.0)

## 2014-10-23 LAB — GLUCOSE, CAPILLARY
Glucose-Capillary: 57 mg/dL — ABNORMAL LOW (ref 70–99)
Glucose-Capillary: 80 mg/dL (ref 70–99)
Glucose-Capillary: 87 mg/dL (ref 70–99)

## 2014-10-23 MED ORDER — SPIRONOLACTONE 25 MG PO TABS
25.0000 mg | ORAL_TABLET | Freq: Every day | ORAL | Status: DC
  Administered 2014-10-23: 25 mg via ORAL
  Filled 2014-10-23 (×2): qty 1

## 2014-10-23 MED ORDER — CARVEDILOL 6.25 MG PO TABS: 6.2500 mg | ORAL_TABLET | Freq: Two times a day (BID) | ORAL | Status: DC

## 2014-10-23 MED ORDER — DIGOXIN 125 MCG PO TABS: 0.1250 mg | ORAL_TABLET | Freq: Every day | ORAL | Status: DC

## 2014-10-23 MED ORDER — LISINOPRIL 20 MG PO TABS: 20.0000 mg | ORAL_TABLET | Freq: Two times a day (BID) | ORAL | Status: DC

## 2014-10-23 MED ORDER — SPIRONOLACTONE 25 MG PO TABS: 25.0000 mg | ORAL_TABLET | Freq: Every day | ORAL | Status: DC

## 2014-10-23 MED ORDER — FUROSEMIDE 80 MG PO TABS: 80.0000 mg | ORAL_TABLET | Freq: Every day | ORAL | Status: DC

## 2014-10-23 MED ORDER — INSULIN GLARGINE 100 UNIT/ML ~~LOC~~ SOLN: 75.0000 [IU] | Freq: Every day | SUBCUTANEOUS | Status: DC

## 2014-10-23 MED ORDER — INSULIN GLARGINE 100 UNIT/ML ~~LOC~~ SOLN
70.0000 [IU] | Freq: Every day | SUBCUTANEOUS | Status: DC
  Filled 2014-10-23: qty 0.7

## 2014-10-23 MED ORDER — INSULIN GLARGINE 100 UNIT/ML SOLOSTAR PEN: 70.0000 [IU] | PEN_INJECTOR | Freq: Every day | SUBCUTANEOUS | Status: DC

## 2014-10-23 NOTE — Progress Notes (Signed)
Patient ID: Kristen Snyder, female   DOB: 16-Apr-1966, 48 y.o.   MRN: [id number]   Kristen Snyder is a 48 y/o woman with DM, HTN and HL admitted with acute on chronic systolic CHF.  She developed dyspnea in 10/15. ECHO with EF 10-15% Saw Dr. Stanford Breed who scheduled R/L [facility] on 10/08/14 with Dr. Irish Lack.   Cath 10/08/14 Minimal CAD EF 15% with LVEDP 25 RA 7 RV 40/5/9 PA 43/23 (32) Unable to wedge PA sat 62% CO 4.9/2.7  Presented 12/4 for initial evaluation in HF Clinic. Says she has had HTN for long time when seeing Dr. Criss Rosales but stopped seeing her in 2013 when she lost her job and had not meds since. She says she has no idea what her BP was during that time.  Has gained 15 pounds since her heart cath. Does not have scale at home. + DOE on mild to moderate exertion. + orthopnea. + edema. Snores heavily.  She was admitted directly from clinic and started on IV Lasix.  PICC was placed.  Yesterday carvedilol was increased and transitioned to po lasix 80 mg daily. Weight up 2 pounds.  Walking all around unit. No doe.    Denies SOB/Orthopnea.   Scheduled Meds: . carvedilol  6.25 mg Oral BID WC  . digoxin  0.125 mg Oral Daily  . enoxaparin (LOVENOX) injection  40 mg Subcutaneous Q24H  . furosemide  80 mg Oral Daily  . insulin aspart  0-9 Units Subcutaneous TID WC  . insulin glargine  80 Units Subcutaneous QHS  . lisinopril  20 mg Oral BID  . spironolactone  12.5 mg Oral Daily   Continuous Infusions:  PRN Meds:.sodium chloride, acetaminophen, ondansetron (ZOFRAN) IV, sodium chloride, sodium chloride    Filed Vitals:   10/22/14 1420 10/22/14 1810 10/22/14 2027 10/23/14 0524  BP: 108/73 123/86 116/87 123/84  Pulse: 103 110 105 100  Temp: 98.2 F (36.8 C)  97.4 F (36.3 C) 97.6 F (36.4 C)  TempSrc: Oral  Oral Oral  Resp: 16  18 18   Height:      Weight:    169 lb (76.658 kg)  SpO2: 97%  98% 99%    Intake/Output Summary (Last 24 hours) at 10/23/14 0720 Last data filed at 10/23/14  0500  Gross per 24 hour  Intake    720 ml  Output   1600 ml  Net   -880 ml    LABS: Basic Metabolic Panel:  Recent Labs  10/21/14 0530 10/22/14 0440  NA 141 143  K 4.2 3.8  CL 106 102  CO2 25 27  GLUCOSE 70 51*  BUN 11 8  CREATININE 0.78 0.72  CALCIUM 8.4 8.7   Liver Function Tests: No results for input(s): AST, ALT, ALKPHOS, BILITOT, PROT, ALBUMIN in the last 72 hours. No results for input(s): LIPASE, AMYLASE in the last 72 hours. CBC: No results for input(s): WBC, NEUTROABS, HGB, HCT, MCV, PLT in the last 72 hours. Cardiac Enzymes: No results for input(s): CKTOTAL, CKMB, CKMBINDEX, TROPONINI in the last 72 hours. BNP: Invalid input(s): POCBNP D-Dimer: No results for input(s): DDIMER in the last 72 hours. Hemoglobin A1C: No results for input(s): HGBA1C in the last 72 hours. Fasting Lipid Panel: No results for input(s): CHOL, HDL, LDLCALC, TRIG, CHOLHDL, LDLDIRECT in the last 72 hours. Thyroid Function Tests: No results for input(s): TSH, T4TOTAL, T3FREE, THYROIDAB in the last 72 hours.  Invalid input(s): FREET3 Anemia Panel: No results for input(s): VITAMINB12, FOLATE, FERRITIN, TIBC, IRON, RETICCTPCT in  the last 72 hours.  RADIOLOGY: No results found.  PHYSICAL EXAM General: NAD, sitting up in bed Neck: JVP 5-6 cm, no thyromegaly or thyroid nodule.  Lungs: Clear to auscultation bilaterally with normal respiratory effort. CV: Lateral PMI.  Heart mildly tachy S1/S2, , no murmur. No edema, TED hose intact. + S3  Abdomen: Soft, nontender, no hepatosplenomegaly, no distention.  Neurologic: Alert and oriented x 3.  Psych: Normal affect. Extremities: No clubbing or cyanosis.   TELEMETRY: Reviewed telemetry pt in ST in 100-110s  ASSESSMENT AND PLAN: 48 yo with history of HTN and nonischemic cardiomyopathy was admitted with acute on chronic systolic CHF. 1. Acute on chronic systolic: Nonischemic cardiomyopathy.  EF 10-15% by echo.  Suspect this is related to  uncontrolled HTN x years.   - Volume status stable. Continue lasix 80 mg daily.  - Continue coreg to 6.25 mg BID  - Continue lisinopril 20 mg twice a day.  - Continue digoxin 0.125 mg daily.  - Increase spironolactone 25 mg daily -Check BMET now.  2. HTN: Improved 3. Cognitive function: Improved. Able to verbalize HF home maintenance to include: daily weights, low salt diet, limiting fluid intake, and daily medications. 4. DM- Hypoglycemia noted the last 2 mornings. Cut back lantus to 70 units q hs. Has follow up with PCP.    Home today. Follow up in HF clinic next week. She will need to follow up on Friday because its the only day she is off work.    CLEGG,AMY NP-C 10/23/2014 7:20 AM  Patient seen and examined with Darrick Grinder, NP. We discussed all aspects of the encounter. I agree with the assessment and plan as stated above.   Much improved. Can go home today on above regimen. Long talk about HF meds and daily weights. We will see in HF Clinic on Friday.   Benay Spice 9:33 AM

## 2014-10-23 NOTE — Progress Notes (Signed)
DC IV, DC Tele, DC Home. Discharge instructions and home medications disscused with patient. Patient denied questions and concerns. Patient leaving unit via wheelchair and appears in no acute distress.

## 2014-10-23 NOTE — Progress Notes (Signed)
Hypoglycemic Event  CBG: 57 mg/dL  Treatment: 15 GM carbohydrate snack  Symptoms: Sweaty  Follow-up CBG: [id number] CBG Result: 80 mg/dL   Possible Reasons for Event: medication regimen and diet  Comments/MD notified:per protocol    Kristen Snyder L  Remember to initiate Hypoglycemia Order Set & complete

## 2014-10-23 NOTE — Discharge Instructions (Signed)

## 2014-10-25 ENCOUNTER — Encounter (HOSPITAL_COMMUNITY): Payer: Self-pay | Admitting: Interventional Cardiology

## 2014-10-26 ENCOUNTER — Ambulatory Visit (HOSPITAL_COMMUNITY)
Admit: 2014-10-26 | Discharge: 2014-10-26 | Disposition: A | Discharge status: Home / Self Care | Payer: Medicaid Other | Source: Ambulatory Visit | Attending: Internal Medicine | Admitting: Internal Medicine

## 2014-10-26 ENCOUNTER — Encounter (HOSPITAL_COMMUNITY): Payer: Self-pay

## 2014-10-26 ENCOUNTER — Telehealth (HOSPITAL_COMMUNITY): Payer: Self-pay | Admitting: Vascular Surgery

## 2014-10-26 VITALS — BP 152/80 | HR 105 | Wt 171.8 lb

## 2014-10-26 DIAGNOSIS — Z9119 Patient's noncompliance with other medical treatment and regimen: Secondary | ICD-10-CM

## 2014-10-26 DIAGNOSIS — R0683 Snoring: Secondary | ICD-10-CM | POA: Insufficient documentation

## 2014-10-26 DIAGNOSIS — I429 Cardiomyopathy, unspecified: Secondary | ICD-10-CM | POA: Insufficient documentation

## 2014-10-26 DIAGNOSIS — E119 Type 2 diabetes mellitus without complications: Secondary | ICD-10-CM | POA: Insufficient documentation

## 2014-10-26 DIAGNOSIS — I10 Essential (primary) hypertension: Secondary | ICD-10-CM | POA: Diagnosis not present

## 2014-10-26 DIAGNOSIS — Z794 Long term (current) use of insulin: Secondary | ICD-10-CM | POA: Insufficient documentation

## 2014-10-26 DIAGNOSIS — Z79899 Other long term (current) drug therapy: Secondary | ICD-10-CM | POA: Insufficient documentation

## 2014-10-26 DIAGNOSIS — Z91199 Patient's noncompliance with other medical treatment and regimen due to unspecified reason: Secondary | ICD-10-CM | POA: Insufficient documentation

## 2014-10-26 DIAGNOSIS — I5022 Chronic systolic (congestive) heart failure: Secondary | ICD-10-CM | POA: Diagnosis present

## 2014-10-26 DIAGNOSIS — E785 Hyperlipidemia, unspecified: Secondary | ICD-10-CM | POA: Insufficient documentation

## 2014-10-26 DIAGNOSIS — Z7982 Long term (current) use of aspirin: Secondary | ICD-10-CM | POA: Insufficient documentation

## 2014-10-26 MED ORDER — ISOSORB DINITRATE-HYDRALAZINE 20-37.5 MG PO TABS: 1.0000 | ORAL_TABLET | Freq: Three times a day (TID) | ORAL | Status: DC

## 2014-10-26 NOTE — Telephone Encounter (Signed)
Pt called she was just her this morning... Pt was started on Bidil this morning.. The pt states the medication has her very dizzy.Marland Kitchen She has to hold on to the wall to walk.. She said this is not for her.. Please advise

## 2014-10-26 NOTE — Progress Notes (Signed)
Patient ID: Kristen Snyder, female   DOB: [birth year], 48 y.o.   MRN: [id number] PCP: [facility] Latino [facility]). Previously followed by Dr. Criss Rosales Primary Cardiologist: Stanford Breed  HPI: Kristen Snyder is a 48 y/o woman with DM, HTN, chronic systolic heart failure, and HL.  She developed dyspnea in 10/15. ECHO with EF 10-15% Saw Dr. Stanford Breed who scheduled R/L [facility] on 10/08/14 with Dr. Irish Lack.   Admitted to [facility] 10/19/14 from the HF clinic with an acute hf exacerbation and class IV symptoms. Diuresed with lasix and transitioned to lasix 80 mg daily. HF meds adjusted and she was later discharged home 169 pounds.   She returns for post hospital follow up. Overall feeling good. Denies SOB/PND/Orthopnea. Able to walk 5-10 minutes at a time. Taking all medications. Weight at home 166 pounds. Following low salt diet and drinking < than 2 liters of fluid. Working full time again.   Cath 10/08/14 Minimal CAD EF 15% with LVEDP 25 RA  7 RV 40/5/9 PA 43/23 (32) Unable to wedge PA sat 62% CO 4.9/2.7  ECHO 10/05/14 EF 10-15%   Labs 10/23/14 K 4.5 Creatinine 0.63   SH: Lives at home with husband and 49 year old daughter. 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  FH: Sister ? Abnormal heart  ROS: All systems negative except as listed in HPI, PMH and Problem List.  SH:  History   Social History  . Marital Status: Married    Spouse Name: [name]    Number of Children: 1  . Years of Education: [name]   Occupational History  .      [employer]   Social History Main Topics  . Smoking status: Never Smoker   . Smokeless tobacco: Not on file  . Alcohol Use: 0.0 oz/week    0 Not specified per week     Comment: Rare  . Drug Use: No  . Sexual Activity: Not on file   Other Topics Concern  . Not on file   Social History Narrative    FH:  Family History  Problem Relation Age of Onset  . Heart disease      No family history  . Diabetes Mother   . Hypertension Father     Past Medical History   Diagnosis Date  . Diabetes mellitus   . Hypertension   . Hyperlipidemia   . NICM (nonischemic cardiomyopathy)     a) LHC (10/08/14) Normal coronaries, mild plaque in mid LAD, EF 15%  . Chronic systolic heart failure     a) RHC (10/08/14): RA: 7, RV: 40/5, RVEDP: 9, PA: 43/23 (32), unable wedge, PA: 62%, CO/CI: 4.9/ 2.69; b) Echo (10/05/2014): EF 10-15%, mild MR directed centrally    Current Outpatient Prescriptions  Medication Sig Dispense Refill  . aspirin EC 81 MG tablet Take 1 tablet (81 mg total) by mouth daily. (Patient not taking: Reported on 10/19/2014) 90 tablet 3  . carvedilol (COREG) 6.25 MG tablet Take 1 tablet (6.25 mg total) by mouth 2 (two) times daily with a meal. 60 tablet 6  . digoxin (LANOXIN) 0.125 MG tablet Take 1 tablet (0.125 mg total) by mouth daily. 30 tablet 6  . furosemide (LASIX) 80 MG tablet Take 1 tablet (80 mg total) by mouth daily. May take an extra 80 mg for 3 pound weight gain in 24 hours 45 tablet 6  . Insulin Glargine (LANTUS SOLOSTAR) 100 UNIT/ML Solostar Pen Inject 70 Units into the skin daily at 10 pm. 15 mL 11  .  lisinopril (PRINIVIL,ZESTRIL) 20 MG tablet Take 1 tablet (20 mg total) by mouth 2 (two) times daily. 60 tablet 6  . spironolactone (ALDACTONE) 25 MG tablet Take 1 tablet (25 mg total) by mouth daily. 30 tablet 6   No current facility-administered medications for this encounter.    Filed Vitals:   10/26/14 0924  BP: 152/80  Pulse: 105  Weight: 171 lb 12.8 oz (77.928 kg)  SpO2: 100%    PHYSICAL EXAM: General:  Well appearing. No resp difficulty. No difficulty ambulating in the clinic.  HEENT: normal Neck: supple. JVP 5-6. Carotids 2+ bilaterally; no bruits. No lymphadenopathy or thryomegaly appreciated. Cor: PMI normal. Tachycardic. Regular. +s3 Lungs: clear Abdomen: soft, nontender, mildly distended. No hepatosplenomegaly. No bruits or masses. Good bowel sounds. Extremities: no cyanosis, clubbing, rash, edema Neuro: alert &  orientedx3, cranial nerves grossly intact. Moves all 4 extremities w/o difficulty. Affect pleasant.   ASSESSMENT & PLAN: 1.Chronic Systolic HF due to NICM. Onset 10/15. EF 10-15% NYHA II. Volume status stable. Continue lasix 80 mg dialy and 25 mg spironolactone daily.  -Continue carvedilol 6.25 mg twice a day and digoxin 0.125 mg daily  -On lisinopril 20 mg twice a day -Add Bidil 1 tab three times a day  Reinforced daily weights, low salt food choices, and limiting fluid intake to < 2 liters per day.  ECHO- EF 10-15% 10/05/2014. Plan to repeat ECHO after HF meds optimized.  2. HTN- Elevated. Contiinue current dose of carvedilol, lisinopril, spironolactone. Add Bidil as above.  3. DM2- On insulin.  Does not have glucometer. Needs follow up with PCP 4. Noncompliance- Congratulated on medications.  5. Snoring - probable OSA. Consider referral to pulmonary later. She would like to hold off.    Follow up in 1 week and plan to check BMET, Pro BNP, and dig level.   CLEGG,AMY NP-C  9:26 AM

## 2014-10-26 NOTE — Patient Instructions (Signed)
Look [place] today! F/U in 1 week with the [place] for next visit Please begin BiDil 1 tab three times daily

## 2014-10-26 NOTE — Telephone Encounter (Signed)
Complaining of dizziness.   Started Bidil this am after this morning visit. Says she had dizziness.   I have asked her to cut back bidil to 1/2 tablet three times a day.  Instructed to stop if she develops dizziness after next dose.   I asked her call HF clinic next week if she stops.   CLEGG,AMY NP-C  12:40 PM

## 2014-10-30 ENCOUNTER — Inpatient Hospital Stay (HOSPITAL_COMMUNITY): Payer: Medicaid Other

## 2014-11-02 ENCOUNTER — Encounter (HOSPITAL_COMMUNITY): Payer: Self-pay

## 2014-11-02 ENCOUNTER — Ambulatory Visit (HOSPITAL_COMMUNITY)
Admission: RE | Admit: 2014-11-02 | Discharge: 2014-11-02 | Disposition: A | Discharge status: Home / Self Care | Payer: Medicaid Other | Source: Ambulatory Visit | Attending: Internal Medicine | Admitting: Internal Medicine

## 2014-11-02 VITALS — BP 134/80 | HR 105 | Resp 18 | Wt 169.1 lb

## 2014-11-02 DIAGNOSIS — R0683 Snoring: Secondary | ICD-10-CM | POA: Diagnosis not present

## 2014-11-02 DIAGNOSIS — I10 Essential (primary) hypertension: Secondary | ICD-10-CM | POA: Insufficient documentation

## 2014-11-02 DIAGNOSIS — I5022 Chronic systolic (congestive) heart failure: Secondary | ICD-10-CM | POA: Diagnosis not present

## 2014-11-02 DIAGNOSIS — Z91199 Patient's noncompliance with other medical treatment and regimen due to unspecified reason: Secondary | ICD-10-CM

## 2014-11-02 DIAGNOSIS — Z794 Long term (current) use of insulin: Secondary | ICD-10-CM | POA: Insufficient documentation

## 2014-11-02 DIAGNOSIS — Z9119 Patient's noncompliance with other medical treatment and regimen: Secondary | ICD-10-CM | POA: Diagnosis not present

## 2014-11-02 DIAGNOSIS — E119 Type 2 diabetes mellitus without complications: Secondary | ICD-10-CM | POA: Diagnosis not present

## 2014-11-02 MED ORDER — CARVEDILOL 6.25 MG PO TABS: 9.3750 mg | ORAL_TABLET | Freq: Two times a day (BID) | ORAL | Status: DC

## 2014-11-02 NOTE — Patient Instructions (Signed)
INCREASE Carvedilol/Coreg to 1.5 tablets (9.375mg ) twice daily.  Follow up in 3 weeks.  Merry Christmas and Happy New Year!  Good luck on your interview, you're gonna nail it!  Do the following things EVERYDAY: 1) Weigh yourself in the morning before breakfast. Write it down and keep it in a log. 2) Take your medicines as prescribed 3) Eat low salt foods-Limit salt (sodium) to 2000 mg per day.  4) Stay as active as you can everyday 5) Limit all fluids for the day to less than 2 liters

## 2014-11-02 NOTE — Progress Notes (Signed)
Patient ID: Kristen Snyder, female   DOB: 19-May-1966, 48 y.o.   MRN: [id number] PCP: Jackson Latino [facility] FP). Previously followed by Dr. Criss Rosales Primary Cardiologist: Stanford Breed  HPI: Kristen Snyder is a 48 y/o woman with DM, HTN, chronic systolic heart failure, and HL.  She developed dyspnea in 10/15. ECHO with EF 10-15% Saw Dr. Stanford Breed who scheduled R/L [facility] on 10/08/14 with Dr. Irish Lack.   Admitted to [facility] 10/19/14 from the HF clinic with an acute hf exacerbation and class IV symptoms. Diuresed with lasix and transitioned to lasix 80 mg daily. HF meds adjusted and she was later discharged home 169 pounds.   She returns for follow up. Last visit Bidil was added however she was intoleratn due to dizziness. She tried 1 tablet of Bidil and had dizziness . She then tried 1/2 tablet and continued to have dizziness. Overall feeling good. Denies SOB/PND/Orthopnea. Taking all medications. Weight at home 166 pounds. Walking 35 minutes. Able to walk up steps.  Following low salt diet and drinking < than 2 liters of fluid. Working full time again.   Cath 10/08/14 Minimal CAD EF 15% with LVEDP 25 RA  7 RV 40/5/9 PA 43/23 (32) Unable to wedge PA sat 62% CO 4.9/2.7  ECHO 10/05/14 EF 10-15%   Labs 10/23/14 K 4.5 Creatinine 0.63   SH: Lives at home with husband and 23 year old daughter. 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  FH: Sister ? Abnormal heart  ROS: All systems negative except as listed in HPI, PMH and Problem List.  SH:  History   Social History  . Marital Status: Married    Spouse Name: [name]    Number of Children: 1  . Years of Education: [name]   Occupational History  .      [employer]   Social History Main Topics  . Smoking status: Never Smoker   . Smokeless tobacco: Not on file  . Alcohol Use: 0.0 oz/week    0 Not specified per week     Comment: Rare  . Drug Use: No  . Sexual Activity: Not on file   Other Topics Concern  . Not on file   Social History Narrative     FH:  Family History  Problem Relation Age of Onset  . Heart disease      No family history  . Diabetes Mother   . Hypertension Father     Past Medical History  Diagnosis Date  . Diabetes mellitus   . Hypertension   . Hyperlipidemia   . NICM (nonischemic cardiomyopathy)     a) LHC (10/08/14) Normal coronaries, mild plaque in mid LAD, EF 15%  . Chronic systolic heart failure     a) RHC (10/08/14): RA: 7, RV: 40/5, RVEDP: 9, PA: 43/23 (32), unable wedge, PA: 62%, CO/CI: 4.9/ 2.69; b) Echo (10/05/2014): EF 10-15%, mild MR directed centrally    Current Outpatient Prescriptions  Medication Sig Dispense Refill  . aspirin EC 81 MG tablet Take 1 tablet (81 mg total) by mouth daily. 90 tablet 3  . carvedilol (COREG) 6.25 MG tablet Take 1.5 tablets (9.375 mg total) by mouth 2 (two) times daily with a meal. 75 tablet 6  . digoxin (LANOXIN) 0.125 MG tablet Take 1 tablet (0.125 mg total) by mouth daily. 30 tablet 6  . furosemide (LASIX) 80 MG tablet Take 1 tablet (80 mg total) by mouth daily. May take an extra 80 mg for 3 pound weight gain in 24 hours 45  tablet 6  . Insulin Glargine (LANTUS SOLOSTAR) 100 UNIT/ML Solostar Pen Inject 70 Units into the skin daily at 10 pm. 15 mL 11  . lisinopril (PRINIVIL,ZESTRIL) 20 MG tablet Take 1 tablet (20 mg total) by mouth 2 (two) times daily. 60 tablet 6  . spironolactone (ALDACTONE) 25 MG tablet Take 1 tablet (25 mg total) by mouth daily. 30 tablet 6   No current facility-administered medications for this encounter.    Filed Vitals:   11/02/14 1128  BP: 134/80  Pulse: 105  Resp: 18  Weight: 169 lb 2 oz (76.715 kg)  SpO2: 99%    PHYSICAL EXAM: General:  Well appearing. No resp difficulty. No difficulty ambulating in the clinic.  HEENT: normal Neck: supple. JVP 5-6. Carotids 2+ bilaterally; no bruits. No lymphadenopathy or thryomegaly appreciated. Cor: PMI normal. Tachycardic. Regular. +s3 Lungs: clear Abdomen: soft, nontender, mildly  distended. No hepatosplenomegaly. No bruits or masses. Good bowel sounds. Extremities: no cyanosis, clubbing, rash, edema Neuro: alert & orientedx3, cranial nerves grossly intact. Moves all 4 extremities w/o difficulty. Affect pleasant.   ASSESSMENT & PLAN: 1.Chronic Systolic HF due to NICM. Onset 10/15. ECHO 10/05/14 EF 10-15% NYHA II. Volume status stable. Continue lasix 80 mg dialy and 25 mg spironolactone daily.  -Increase carvedilol 9.375 mg twice a day and continue digoxin 0.125 mg daily  -On lisinopril 20 mg twice a day -Intolerat Bidil due to dizziness and disorientation.  Reinforced daily weights, low salt food choices, and limiting fluid intake to < 2 liters per day.  ECHO- EF 10-15% 10/05/2014. Plan to repeat ECHO after HF meds optimized.  likely March  2. HTN- Ok. Contiinue current dose ofl, lisinopril, spironolactone. Increae carvedilol as above.   3. DM2- On insulin.  Does not have glucometer. Needs follow up with PCP 4. Noncompliance- Taking all medications.   5. Snoring - probable OSA. Consider referral to pulmonary later. She would like to hold off.    Follow up 3 weeks. Check dig level.   Haadiya Frogge NP-C  11:43 AM

## 2014-11-16 DIAGNOSIS — Z923 Personal history of irradiation: Secondary | ICD-10-CM

## 2014-11-16 HISTORY — DX: Personal history of irradiation: Z92.3

## 2014-11-23 ENCOUNTER — Ambulatory Visit (HOSPITAL_COMMUNITY)
Admission: RE | Admit: 2014-11-23 | Discharge: 2014-11-23 | Disposition: A | Discharge status: Home / Self Care | Payer: Medicaid Other | Source: Ambulatory Visit | Attending: Cardiology | Admitting: Cardiology

## 2014-11-23 VITALS — BP 130/86 | HR 100 | Wt 171.0 lb

## 2014-11-23 DIAGNOSIS — Z9119 Patient's noncompliance with other medical treatment and regimen: Secondary | ICD-10-CM | POA: Insufficient documentation

## 2014-11-23 DIAGNOSIS — Z794 Long term (current) use of insulin: Secondary | ICD-10-CM | POA: Diagnosis not present

## 2014-11-23 DIAGNOSIS — R0683 Snoring: Secondary | ICD-10-CM | POA: Insufficient documentation

## 2014-11-23 DIAGNOSIS — I10 Essential (primary) hypertension: Secondary | ICD-10-CM | POA: Diagnosis not present

## 2014-11-23 DIAGNOSIS — I5022 Chronic systolic (congestive) heart failure: Secondary | ICD-10-CM | POA: Diagnosis not present

## 2014-11-23 DIAGNOSIS — E119 Type 2 diabetes mellitus without complications: Secondary | ICD-10-CM | POA: Diagnosis not present

## 2014-11-23 LAB — DIGOXIN LEVEL: Digoxin Level: 0.3 ng/mL — ABNORMAL LOW (ref 0.8–2.0)

## 2014-11-23 LAB — BASIC METABOLIC PANEL
Anion gap: 5 (ref 5–15)
BUN: 10 mg/dL (ref 6–23)
CO2: 26 mmol/L (ref 19–32)
CREATININE: 0.77 mg/dL (ref 0.50–1.10)
Calcium: 9.1 mg/dL (ref 8.4–10.5)
Chloride: 107 mEq/L (ref 96–112)
GFR calc Af Amer: >90 mL/min (ref >90)
GFR calc non Af Amer: >90 mL/min (ref >90)
GLUCOSE: 143 mg/dL — AB (ref 70–99)
POTASSIUM: 4.4 mmol/L (ref 3.5–5.1)
Sodium: 138 mmol/L (ref 135–145)

## 2014-11-23 MED ORDER — CARVEDILOL 12.5 MG PO TABS: 12.5000 mg | ORAL_TABLET | Freq: Two times a day (BID) | ORAL | Status: DC

## 2014-11-23 NOTE — Progress Notes (Signed)
Patient ID: Kristen Snyder, female   DOB: 09-01-1966, 49 y.o.   MRN: [id number] PCP: [facility] Latino [facility] FP). Previously followed by Dr. Criss Rosales Primary Cardiologist: Stanford Breed  HPI: Kristen Snyder is a 49 y/o woman with DM, HTN, chronic systolic heart failure, and HL.  She developed dyspnea in 10/15. ECHO with EF 10-15% Saw Dr. Stanford Breed who scheduled R/L [facility] on 10/08/14 with Dr. Irish Lack.   Admitted to [facility] 10/19/14 from the HF clinic with an acute hf exacerbation and class IV symptoms. Diuresed with lasix and transitioned to lasix 80 mg daily. HF meds adjusted and she was later discharged home 169 pounds.   She returns for follow up. Last visit carvedilol was increased to 9.375 mg twice a day. Denies SOB/PND/Orthopnea. Taking all medications. Weight at home 166-168 pounds. Walking 35 minutes. Able to walk up steps.  Following low salt diet and drinking < than 2 liters of fluid. Working full time again.   Cath 10/08/14 Minimal CAD EF 15% with LVEDP 25 RA  7 RV 40/5/9 PA 43/23 (32) Unable to wedge PA sat 62% CO 4.9/2.7  ECHO 10/05/14 EF 10-15%   Labs 10/23/14 K 4.5 Creatinine 0.63, HCT 35.1   SH: Lives at home with husband and 50 year old daughter. 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  FH: Sister ? Abnormal heart  ROS: All systems negative except as listed in HPI, PMH and Problem List.  SH:  History   Social History  . Marital Status: Married    Spouse Name: [name]    Number of Children: 1  . Years of Education: [name]   Occupational History  .      [employer]   Social History Main Topics  . Smoking status: Never Smoker   . Smokeless tobacco: Not on file  . Alcohol Use: 0.0 oz/week    0 Not specified per week     Comment: Rare  . Drug Use: No  . Sexual Activity: Not on file   Other Topics Concern  . Not on file   Social History Narrative    FH:  Family History  Problem Relation Age of Onset  . Heart disease      No family history  . Diabetes Mother   .  Hypertension Father     Past Medical History  Diagnosis Date  . Diabetes mellitus   . Hypertension   . Hyperlipidemia   . NICM (nonischemic cardiomyopathy)     a) LHC (10/08/14) Normal coronaries, mild plaque in mid LAD, EF 15%  . Chronic systolic heart failure     a) RHC (10/08/14): RA: 7, RV: 40/5, RVEDP: 9, PA: 43/23 (32), unable wedge, PA: 62%, CO/CI: 4.9/ 2.69; b) Echo (10/05/2014): EF 10-15%, mild MR directed centrally    Current Outpatient Prescriptions  Medication Sig Dispense Refill  . aspirin EC 81 MG tablet Take 1 tablet (81 mg total) by mouth daily. 90 tablet 3  . carvedilol (COREG) 6.25 MG tablet Take 1.5 tablets (9.375 mg total) by mouth 2 (two) times daily with a meal. 75 tablet 6  . digoxin (LANOXIN) 0.125 MG tablet Take 1 tablet (0.125 mg total) by mouth daily. 30 tablet 6  . furosemide (LASIX) 80 MG tablet Take 1 tablet (80 mg total) by mouth daily. May take an extra 80 mg for 3 pound weight gain in 24 hours 45 tablet 6  . Insulin Glargine (LANTUS SOLOSTAR) 100 UNIT/ML Solostar Pen Inject 70 Units into the skin daily at 10 pm.  15 mL 11  . lisinopril (PRINIVIL,ZESTRIL) 20 MG tablet Take 1 tablet (20 mg total) by mouth 2 (two) times daily. 60 tablet 6  . spironolactone (ALDACTONE) 25 MG tablet Take 1 tablet (25 mg total) by mouth daily. 30 tablet 6   No current facility-administered medications for this encounter.    Filed Vitals:   11/23/14 1125  BP: 130/86  Pulse: 100  Weight: 171 lb (77.565 kg)  SpO2: 99%    PHYSICAL EXAM: General:  Well appearing. No resp difficulty. No difficulty ambulating in the clinic.  HEENT: normal Neck: supple. JVP 5-6. Carotids 2+ bilaterally; no bruits. No lymphadenopathy or thryomegaly appreciated. Cor: PMI normal. Tachycardic. Regular. +s3 Lungs: clear Abdomen: soft, nontender, mildly distended. No hepatosplenomegaly. No bruits or masses. Good bowel sounds. Extremities: no cyanosis, clubbing, rash, edema Neuro: alert &  orientedx3, cranial nerves grossly intact. Moves all 4 extremities w/o difficulty. Affect pleasant.   ASSESSMENT & PLAN: 1.Chronic Systolic HF due to NICM, ?viral myocarditis. Onset 10/15. ECHO 10/05/14 EF 10-15% NYHA II. Volume status stable. Continue lasix 80 mg dialy and 25 mg spironolactone daily.  -Increase carvedilol to 12.5  mg twice a day and continue digoxin 0.125 mg daily  -On lisinopril 20 mg twice a day -Intolerant of Bidil due to dizziness and disorientation.  - Reinforced daily weights, low salt food choices, and limiting fluid intake to < 2 liters per day.  - ECHO- EF 10-15% 10/05/2014. Plan to repeat ECHO after HF meds optimized likely March  2. HTN- Ok. Contiinue current dose ofl, lisinopril, spironolactone. Increae carvedilol as above.   3. DM2- On insulin.  Does not have glucometer.  PCP 4. Noncompliance- Taking all medications.   5. Snoring - probable OSA. Consider referral to pulmonary later. She would like to hold off.   Follow up 3-4 weeks. Check dig and bmet level.   CLEGG,AMY NP-C  11:27 AM   Patient seen with NP, agree with the above note.  Doing well without significant dyspnea, orthopnea, or PND.  NYHA class II.  Narrow QRS on ECG so will not be CRT candidate.  She did not tolerate Bidil due to dizziness even with 1/2 tab tid.  - Repeat echo in 3/16 to decide on ICD>  - BMET/digoxin level today.  - Increase Coreg to 12.5 mg bid.  - Followup in 1 month for medication titration.   Loralie Champagne 11/25/2014

## 2014-11-23 NOTE — Patient Instructions (Signed)
INCREASE Carvedilol (Coreg) to 12.5 mg twice daily.  Follow up 4 weeks.  Do the following things EVERYDAY: 1) Weigh yourself in the morning before breakfast. Write it down and keep it in a log. 2) Take your medicines as prescribed 3) Eat low salt foods-Limit salt (sodium) to 2000 mg per day.  4) Stay as active as you can everyday 5) Limit all fluids for the day to less than 2 liters  

## 2014-12-01 NOTE — Progress Notes (Signed)
HPI: FU CHF. Recently seen for new onset CHF; echo 11/15 showed EF 10-15, restrictive filling, mild LAE, mild MR, moderately elevated pulmonary pressure. TSH normal. Cath 11/15 showed normal cors, EF 15, LVEDP 25. Admitted with CHF 12/15 which improved with diuresis and meds.  Since she was last seen, the patient denies any dyspnea on exertion, orthopnea, PND, pedal edema, palpitations, syncope or chest pain.   Current Outpatient Prescriptions  Medication Sig Dispense Refill  . aspirin EC 81 MG tablet Take 1 tablet (81 mg total) by mouth daily. 90 tablet 3  . carvedilol (COREG) 12.5 MG tablet Take 1 tablet (12.5 mg total) by mouth 2 (two) times daily with a meal. 60 tablet 3  . digoxin (LANOXIN) 0.125 MG tablet Take 1 tablet (0.125 mg total) by mouth daily. 30 tablet 6  . furosemide (LASIX) 80 MG tablet Take 1 tablet (80 mg total) by mouth daily. May take an extra 80 mg for 3 pound weight gain in 24 hours 45 tablet 6  . Insulin Glargine (LANTUS SOLOSTAR) 100 UNIT/ML Solostar Pen Inject 70 Units into the skin daily at 10 pm. (Patient taking differently: Inject 80 Units into the skin daily at 10 pm. ) 15 mL 11  . lisinopril (PRINIVIL,ZESTRIL) 20 MG tablet Take 1 tablet (20 mg total) by mouth 2 (two) times daily. 60 tablet 6  . spironolactone (ALDACTONE) 25 MG tablet Take 1 tablet (25 mg total) by mouth daily. 30 tablet 6   No current facility-administered medications for this visit.     Past Medical History  Diagnosis Date  . Diabetes mellitus   . Hypertension   . Hyperlipidemia   . NICM (nonischemic cardiomyopathy)     a) LHC (10/08/14) Normal coronaries, mild plaque in mid LAD, EF 15%  . Chronic systolic heart failure     a) RHC (10/08/14): RA: 7, RV: 40/5, RVEDP: 9, PA: 43/23 (32), unable wedge, PA: 62%, CO/CI: 4.9/ 2.69; b) Echo (10/05/2014): EF 10-15%, mild MR directed centrally    Past Surgical History  Procedure Laterality Date  . Knee surgery    . Shoulder surgery    .  Left and right heart catheterization with coronary angiogram [name] 10/08/2014    Procedure: LEFT AND RIGHT HEART CATHETERIZATION WITH CORONARY ANGIOGRAM;  Surgeon: Jettie Booze, MD;  Location: [facility] CATH LAB;  Service: Cardiovascular;  Laterality: [name];    History   Social History  . Marital Status: Married    Spouse Name: [name]    Number of Children: 1  . Years of Education: [name]   Occupational History  .      [employer]   Social History Main Topics  . Smoking status: Never Smoker   . Smokeless tobacco: Not on file  . Alcohol Use: 0.0 oz/week    0 Not specified per week     Comment: Rare  . Drug Use: No  . Sexual Activity: Not on file   Other Topics Concern  . Not on file   Social History Narrative    ROS: no fevers or chills, productive cough, hemoptysis, dysphasia, odynophagia, melena, hematochezia, dysuria, hematuria, rash, seizure activity, orthopnea, PND, pedal edema, claudication. Remaining systems are negative.  Physical Exam: Well-developed well-nourished in no acute distress.  Skin is warm and dry.  HEENT is normal.  Neck is supple.  Chest is clear to auscultation with normal expansion.  Cardiovascular exam is regular rate and rhythm.  Abdominal exam nontender or distended. No masses  palpated. Extremities show no edema. neuro grossly intact

## 2014-12-03 ENCOUNTER — Encounter: Payer: Self-pay | Admitting: Cardiology

## 2014-12-03 ENCOUNTER — Ambulatory Visit (INDEPENDENT_AMBULATORY_CARE_PROVIDER_SITE_OTHER): Payer: Medicaid Other | Admitting: Cardiology

## 2014-12-03 VITALS — BP 137/89 | HR 86 | Ht 65.0 in | Wt 171.0 lb

## 2014-12-03 DIAGNOSIS — I428 Other cardiomyopathies: Secondary | ICD-10-CM | POA: Insufficient documentation

## 2014-12-03 DIAGNOSIS — I10 Essential (primary) hypertension: Secondary | ICD-10-CM

## 2014-12-03 DIAGNOSIS — I429 Cardiomyopathy, unspecified: Secondary | ICD-10-CM

## 2014-12-03 MED ORDER — CARVEDILOL 12.5 MG PO TABS: 18.7500 mg | ORAL_TABLET | Freq: Two times a day (BID) | ORAL | Status: DC

## 2014-12-03 NOTE — Assessment & Plan Note (Signed)
Blood pressure mildly increased. Increase carvedilol 18.75 mg by mouth twice a day.

## 2014-12-03 NOTE — Patient Instructions (Signed)
Your physician recommends that you schedule a follow-up appointment in: 3 MONTHS WITH DR CRENSHAW  INCREASE CARVEDILOL TO 18.75 MG = 1 AND 1/2 OF A 12.5 MG TABLET TWICE DAILY

## 2014-12-03 NOTE — Assessment & Plan Note (Signed)
Patient is euvolemic on examination.continue present dose of diuretics. She will take an additional 80 mg of Lasix daily for weight gain of 3 pounds. We discussed low-sodium diet.

## 2014-12-03 NOTE — Assessment & Plan Note (Signed)
Continue lisinopril. Increase carvedilol to 18.75 mg by mouth twice a day. Continue to follow up in CHF clinic. Titrate medications as tolerated by pulse and blood pressure. Once fully titrated repeat echocardiogram to reassess LV function. Ejection fraction less than 35% would need to consider ICD.

## 2014-12-27 ENCOUNTER — Encounter (HOSPITAL_COMMUNITY)
Admission: RE | Admit: 2014-12-27 | Discharge: 2014-12-27 | Disposition: A | Discharge status: Home / Self Care | Payer: Medicaid Other | Source: Ambulatory Visit | Attending: Internal Medicine | Admitting: Internal Medicine

## 2014-12-27 DIAGNOSIS — Z5189 Encounter for other specified aftercare: Secondary | ICD-10-CM | POA: Insufficient documentation

## 2014-12-27 DIAGNOSIS — I428 Other cardiomyopathies: Secondary | ICD-10-CM | POA: Insufficient documentation

## 2014-12-27 DIAGNOSIS — I509 Heart failure, unspecified: Secondary | ICD-10-CM | POA: Insufficient documentation

## 2014-12-27 NOTE — Progress Notes (Signed)
Cardiac Rehab Medication Review by a Pharmacist  Does the patient  feel that his/her medications are working for him/her?  yes  Has the patient been experiencing any side effects to the medications prescribed?  yes  Does the patient measure his/her own blood pressure or blood glucose at home?  yes   Does the patient have any problems obtaining medications due to transportation or finances?   no  Understanding of regimen: good Understanding of indications: excellent Potential of compliance: good    Pharmacist comments: Kristen Snyder is a pleasant 63 YOF presenting to cardiac rehab.  She reports compliance with her medications.  She is experiencing dizziness since starting her blood pressure medications.  She does not check her BP at home but has a monitor that's out of batteries.  She agreed to begin checking her BP regularly.  She does report checking her BG at home regularly.  Of note, she reports having episodes of hypoglycemia.  We discussed the masking of symptoms being on a carvedilol, and she is not aware of what symptoms of look out for.  I instructed her to have glucose tabs or candy with her in case she experiences a hypoglycemic episode again.  I am concerned she may also be at risk of hypotension given she may be taking lisinopril twice daily.  I told her to double check her bottle and with physician and told her its normally once daily.    Hassie Bruce, Pharm. D. Clinical Pharmacy Resident Pager: [phone] Ph: [phone] 12/27/2014 9:07 AM       [name] 12/27/2014 9:04 AM

## 2014-12-31 ENCOUNTER — Encounter (HOSPITAL_COMMUNITY): Payer: Medicaid Other

## 2015-01-02 ENCOUNTER — Ambulatory Visit (HOSPITAL_COMMUNITY): Payer: Medicaid Other

## 2015-01-02 ENCOUNTER — Encounter (HOSPITAL_COMMUNITY)
Admission: RE | Admit: 2015-01-02 | Discharge: 2015-01-02 | Disposition: A | Discharge status: Home / Self Care | Payer: Medicaid Other | Source: Ambulatory Visit | Attending: Internal Medicine | Admitting: Internal Medicine

## 2015-01-02 ENCOUNTER — Encounter (HOSPITAL_COMMUNITY): Payer: Self-pay

## 2015-01-02 ENCOUNTER — Ambulatory Visit (HOSPITAL_COMMUNITY)
Admission: RE | Admit: 2015-01-02 | Discharge: 2015-01-02 | Disposition: A | Discharge status: Home / Self Care | Payer: Medicaid Other | Source: Ambulatory Visit | Attending: Family Medicine | Admitting: Family Medicine

## 2015-01-02 DIAGNOSIS — I509 Heart failure, unspecified: Secondary | ICD-10-CM | POA: Diagnosis not present

## 2015-01-02 DIAGNOSIS — I493 Ventricular premature depolarization: Secondary | ICD-10-CM | POA: Diagnosis not present

## 2015-01-02 DIAGNOSIS — R9431 Abnormal electrocardiogram [ECG] [EKG]: Secondary | ICD-10-CM | POA: Insufficient documentation

## 2015-01-02 DIAGNOSIS — I428 Other cardiomyopathies: Secondary | ICD-10-CM | POA: Diagnosis not present

## 2015-01-02 DIAGNOSIS — Z5189 Encounter for other specified aftercare: Secondary | ICD-10-CM | POA: Diagnosis not present

## 2015-01-02 LAB — GLUCOSE, CAPILLARY
Glucose-Capillary: 189 mg/dL — ABNORMAL HIGH (ref 70–99)
Glucose-Capillary: 149 mg/dL — ABNORMAL HIGH (ref 70–99)

## 2015-01-02 NOTE — Progress Notes (Addendum)
Pt started cardiac rehab today.  Pt tolerated light exercise without difficulty.  VSS,telemetry-sinus rhythm, t wave inversion, occ PVC.  twave inversion new compared to 09/28/2014 EKG and December 2015 rhythm strips.  12 lead EKG obtained.  Faxed to heart failure clinic.  Pt asymptomatic today and denies symptoms at home since most recent  hospitalization.  PHQ-0.  Pt does not exhibit barriers to rehab participation, pt demonstrates positive, hopeful outlook with good coping skills.  Pt enjoys participating in church activities, gardening and spending time outdoors. Pt goal for cardiac rehab is to develop an exercise routine into her daily habit.  Pt oriented to exercise equipment and routine.  Understanding verbalized.  Pt does not currently have glucometer for home use, however she is working with her PCP office to obtain one.

## 2015-01-04 ENCOUNTER — Encounter (HOSPITAL_COMMUNITY)
Admission: RE | Admit: 2015-01-04 | Discharge: 2015-01-04 | Disposition: A | Discharge status: Home / Self Care | Payer: Medicaid Other | Source: Ambulatory Visit | Attending: Internal Medicine | Admitting: Internal Medicine

## 2015-01-04 DIAGNOSIS — Z5189 Encounter for other specified aftercare: Secondary | ICD-10-CM | POA: Diagnosis not present

## 2015-01-04 NOTE — Progress Notes (Addendum)
Pt mildly hypotensive post exercise today at cardiac rehab, 85/53b.  Pt admits to not drinking water during exercise.  Pt given gatorade, recheck;  99/60.  Pt reminded importance of drinking during exercise.  Pt will plan to bring water bottle to next exercise session or will request water from staff.   Understanding verbalized

## 2015-01-07 ENCOUNTER — Encounter (HOSPITAL_COMMUNITY)
Admission: RE | Admit: 2015-01-07 | Discharge: 2015-01-07 | Disposition: A | Discharge status: Home / Self Care | Payer: Medicaid Other | Source: Ambulatory Visit | Attending: Internal Medicine | Admitting: Internal Medicine

## 2015-01-07 DIAGNOSIS — Z5189 Encounter for other specified aftercare: Secondary | ICD-10-CM | POA: Diagnosis not present

## 2015-01-07 LAB — GLUCOSE, CAPILLARY: GLUCOSE-CAPILLARY: 222 mg/dL — AB (ref 70–99)

## 2015-01-09 ENCOUNTER — Encounter (HOSPITAL_COMMUNITY)
Admission: RE | Admit: 2015-01-09 | Discharge: 2015-01-09 | Disposition: A | Discharge status: Home / Self Care | Payer: Medicaid Other | Source: Ambulatory Visit | Attending: Internal Medicine | Admitting: Internal Medicine

## 2015-01-09 DIAGNOSIS — Z5189 Encounter for other specified aftercare: Secondary | ICD-10-CM | POA: Diagnosis not present

## 2015-01-09 NOTE — Progress Notes (Signed)
Pt reports she has obtained a glucometer for home use.

## 2015-01-11 ENCOUNTER — Encounter (HOSPITAL_COMMUNITY)
Admission: RE | Admit: 2015-01-11 | Discharge: 2015-01-11 | Disposition: A | Discharge status: Home / Self Care | Payer: Medicaid Other | Source: Ambulatory Visit | Attending: Internal Medicine | Admitting: Internal Medicine

## 2015-01-11 DIAGNOSIS — Z5189 Encounter for other specified aftercare: Secondary | ICD-10-CM | POA: Diagnosis not present

## 2015-01-11 LAB — GLUCOSE, CAPILLARY: GLUCOSE-CAPILLARY: 157 mg/dL — AB (ref 70–99)

## 2015-01-11 NOTE — Progress Notes (Signed)
Reviewed home exercise guidelines with patient including endpoints, temperature precautions, target heart rate and rate of perceived exertion. Pt plans to walk as her mode of home exercise. Pt voices understanding of instructions given. Tandy Grawe M Artia Singley, MS, ACSM CCEP  

## 2015-01-11 NOTE — Progress Notes (Signed)
Pt with low bp reading pre exercise 94/65. Pt asymptomatic.  Pt given gatorade to drink and bp increased to 104/62.  Pt encouraged to drink H20 throughout exercise.  Pt concerned regarding low readings with the medications she is taking. Will send these to Dr. Haroldine Laws for review. Continue to monitor pt.  Cherre Huger, BSN

## 2015-01-14 ENCOUNTER — Encounter (HOSPITAL_COMMUNITY)
Admission: RE | Admit: 2015-01-14 | Discharge: 2015-01-14 | Disposition: A | Discharge status: Home / Self Care | Payer: Medicaid Other | Source: Ambulatory Visit | Attending: Internal Medicine | Admitting: Internal Medicine

## 2015-01-14 DIAGNOSIS — Z5189 Encounter for other specified aftercare: Secondary | ICD-10-CM | POA: Diagnosis not present

## 2015-01-16 ENCOUNTER — Encounter (HOSPITAL_COMMUNITY)
Admission: RE | Admit: 2015-01-16 | Discharge: 2015-01-16 | Disposition: A | Discharge status: Home / Self Care | Payer: Medicaid Other | Source: Ambulatory Visit | Attending: Internal Medicine | Admitting: Internal Medicine

## 2015-01-16 DIAGNOSIS — I509 Heart failure, unspecified: Secondary | ICD-10-CM | POA: Insufficient documentation

## 2015-01-16 DIAGNOSIS — Z5189 Encounter for other specified aftercare: Secondary | ICD-10-CM | POA: Insufficient documentation

## 2015-01-16 DIAGNOSIS — I428 Other cardiomyopathies: Secondary | ICD-10-CM | POA: Insufficient documentation

## 2015-01-18 ENCOUNTER — Encounter (HOSPITAL_COMMUNITY)
Admission: RE | Admit: 2015-01-18 | Discharge: 2015-01-18 | Disposition: A | Discharge status: Home / Self Care | Payer: Medicaid Other | Source: Ambulatory Visit | Attending: Internal Medicine | Admitting: Internal Medicine

## 2015-01-18 DIAGNOSIS — Z5189 Encounter for other specified aftercare: Secondary | ICD-10-CM | POA: Diagnosis not present

## 2015-01-18 NOTE — Progress Notes (Signed)
PSYCHOSOCIAL ASSESSMENT  Pt psychosocial assessment reveals no barriers to rehab participation.  Pt quality of life is slightly altered by her physical constraints which limits her ability to perform tasks as prior to her illness. Pt was working towards her pHD and has given up those studies at this time.  Pt has also had significant deaths in her family in past year.  In addition, pt has suffered a job loss this year.  Despite these stressors,   Pt exhibits positive coping skills, grief pattern and hopeful outlook.  Pt does have support system of family and friend.  Offered emotional support and reassurance.  Will continue to monitor.

## 2015-01-21 ENCOUNTER — Encounter (HOSPITAL_COMMUNITY)
Admission: RE | Admit: 2015-01-21 | Discharge: 2015-01-21 | Disposition: A | Discharge status: Home / Self Care | Payer: Medicaid Other | Source: Ambulatory Visit | Attending: Internal Medicine | Admitting: Internal Medicine

## 2015-01-21 DIAGNOSIS — Z5189 Encounter for other specified aftercare: Secondary | ICD-10-CM | POA: Diagnosis not present

## 2015-01-22 ENCOUNTER — Encounter (HOSPITAL_COMMUNITY): Payer: Medicaid Other

## 2015-01-23 ENCOUNTER — Encounter (HOSPITAL_COMMUNITY)
Admission: RE | Admit: 2015-01-23 | Discharge: 2015-01-23 | Disposition: A | Discharge status: Home / Self Care | Payer: Medicaid Other | Source: Ambulatory Visit | Attending: Internal Medicine | Admitting: Internal Medicine

## 2015-01-23 DIAGNOSIS — Z5189 Encounter for other specified aftercare: Secondary | ICD-10-CM | POA: Diagnosis not present

## 2015-01-24 ENCOUNTER — Telehealth (HOSPITAL_COMMUNITY): Payer: Self-pay | Admitting: Vascular Surgery

## 2015-01-24 DIAGNOSIS — I5023 Acute on chronic systolic (congestive) heart failure: Secondary | ICD-10-CM

## 2015-01-24 DIAGNOSIS — R0602 Shortness of breath: Secondary | ICD-10-CM

## 2015-01-24 NOTE — Telephone Encounter (Addendum)
Pt left message she would like to speak to someone about medication assistance ... PT INSURANCE HAS BEEN CANCELED SHE NEEDS SOME ASSISTANCE Please advise

## 2015-01-25 ENCOUNTER — Encounter (HOSPITAL_COMMUNITY)
Admission: RE | Admit: 2015-01-25 | Discharge: 2015-01-25 | Disposition: A | Discharge status: Home / Self Care | Payer: Medicaid Other | Source: Ambulatory Visit | Attending: Internal Medicine | Admitting: Internal Medicine

## 2015-01-25 DIAGNOSIS — Z5189 Encounter for other specified aftercare: Secondary | ICD-10-CM | POA: Diagnosis not present

## 2015-01-25 MED ORDER — SPIRONOLACTONE 25 MG PO TABS: 25.0000 mg | ORAL_TABLET | Freq: Every day | ORAL | Status: DC

## 2015-01-25 MED ORDER — FUROSEMIDE 80 MG PO TABS: 80.0000 mg | ORAL_TABLET | Freq: Every day | ORAL | Status: DC

## 2015-01-25 MED ORDER — CARVEDILOL 12.5 MG PO TABS: 18.7500 mg | ORAL_TABLET | Freq: Two times a day (BID) | ORAL | Status: DC

## 2015-01-25 MED ORDER — ASPIRIN EC 81 MG PO TBEC: 81.0000 mg | DELAYED_RELEASE_TABLET | Freq: Every day | ORAL | Status: DC

## 2015-01-25 MED ORDER — DIGOXIN 125 MCG PO TABS: 0.1250 mg | ORAL_TABLET | Freq: Every day | ORAL | Status: DC

## 2015-01-25 MED ORDER — LISINOPRIL 20 MG PO TABS: 20.0000 mg | ORAL_TABLET | Freq: Two times a day (BID) | ORAL | Status: DC

## 2015-01-25 NOTE — Telephone Encounter (Signed)
Patient plugged into heart failure assistance program at [facility] outpatient pharmacy until she gets insurance.  Will plug her in with our social worker Kennyth Lose to assist in this transition.  Kristen Snyder

## 2015-01-28 ENCOUNTER — Encounter (HOSPITAL_COMMUNITY)
Admission: RE | Admit: 2015-01-28 | Discharge: 2015-01-28 | Disposition: A | Discharge status: Home / Self Care | Payer: Medicaid Other | Source: Ambulatory Visit | Attending: Internal Medicine | Admitting: Internal Medicine

## 2015-01-28 DIAGNOSIS — Z5189 Encounter for other specified aftercare: Secondary | ICD-10-CM | POA: Diagnosis not present

## 2015-01-30 ENCOUNTER — Encounter (HOSPITAL_COMMUNITY)
Admission: RE | Admit: 2015-01-30 | Discharge: 2015-01-30 | Disposition: A | Discharge status: Home / Self Care | Payer: Medicaid Other | Source: Ambulatory Visit | Attending: Internal Medicine | Admitting: Internal Medicine

## 2015-01-30 DIAGNOSIS — Z5189 Encounter for other specified aftercare: Secondary | ICD-10-CM | POA: Diagnosis not present

## 2015-02-01 ENCOUNTER — Encounter (HOSPITAL_COMMUNITY): Payer: Medicaid Other

## 2015-02-04 ENCOUNTER — Encounter (HOSPITAL_COMMUNITY): Admission: RE | Admit: 2015-02-04 | Payer: Medicaid Other | Source: Ambulatory Visit

## 2015-02-04 ENCOUNTER — Telehealth (HOSPITAL_COMMUNITY): Payer: Self-pay | Admitting: Cardiac Rehabilitation

## 2015-02-04 NOTE — Telephone Encounter (Signed)
pc received from pt reporting she will be absent from cardiac rehab this week for personal reasons.  Plans to return as scheduled next week.

## 2015-02-06 ENCOUNTER — Encounter (HOSPITAL_COMMUNITY): Payer: Medicaid Other

## 2015-02-08 ENCOUNTER — Encounter (HOSPITAL_COMMUNITY): Payer: Medicaid Other

## 2015-02-11 ENCOUNTER — Encounter (HOSPITAL_COMMUNITY): Payer: Medicaid Other

## 2015-02-13 ENCOUNTER — Encounter (HOSPITAL_COMMUNITY): Payer: Medicaid Other

## 2015-02-13 ENCOUNTER — Encounter (HOSPITAL_COMMUNITY): Payer: Self-pay | Admitting: Cardiac Rehabilitation

## 2015-02-13 NOTE — Progress Notes (Signed)
Pt discharged from cardiac rehab program to return to work after previously being unemployed.  Pt plans to exercise on her own.  Pt will need continued physician Support to motivate and encourage her to maintain exercise habit.  No post measurements or surveys completed.

## 2015-02-15 ENCOUNTER — Encounter (HOSPITAL_COMMUNITY): Payer: Medicaid Other

## 2015-02-18 ENCOUNTER — Encounter (HOSPITAL_COMMUNITY): Payer: Medicaid Other

## 2015-02-20 ENCOUNTER — Encounter (HOSPITAL_COMMUNITY): Payer: Medicaid Other

## 2015-02-22 ENCOUNTER — Telehealth: Payer: Self-pay | Admitting: Cardiology

## 2015-02-22 ENCOUNTER — Encounter (HOSPITAL_COMMUNITY): Payer: Medicaid Other

## 2015-02-22 ENCOUNTER — Telehealth: Payer: Self-pay | Admitting: *Deleted

## 2015-02-22 MED ORDER — CARVEDILOL 12.5 MG PO TABS: 18.7500 mg | ORAL_TABLET | Freq: Two times a day (BID) | ORAL | Status: DC

## 2015-02-22 NOTE — Telephone Encounter (Signed)
Erroneous enc

## 2015-02-22 NOTE — Telephone Encounter (Signed)
[facility] pharmacist called verifying correct dose of carvedilol due to discrepancy on a prescription transfer. Advised on last prescribed dose, resubmitted Rx directly to [facility].

## 2015-02-25 ENCOUNTER — Encounter (HOSPITAL_COMMUNITY): Payer: Medicaid Other

## 2015-02-27 ENCOUNTER — Encounter (HOSPITAL_COMMUNITY): Payer: Medicaid Other

## 2015-03-01 ENCOUNTER — Encounter (HOSPITAL_COMMUNITY): Payer: Medicaid Other

## 2015-03-04 ENCOUNTER — Ambulatory Visit: Payer: Medicaid Other | Admitting: Cardiology

## 2015-03-04 ENCOUNTER — Encounter (HOSPITAL_COMMUNITY): Payer: Medicaid Other

## 2015-03-06 ENCOUNTER — Encounter (HOSPITAL_COMMUNITY): Payer: Medicaid Other

## 2015-03-08 ENCOUNTER — Encounter (HOSPITAL_COMMUNITY): Payer: Medicaid Other

## 2015-03-11 ENCOUNTER — Encounter (HOSPITAL_COMMUNITY): Payer: Medicaid Other

## 2015-03-11 ENCOUNTER — Telehealth (HOSPITAL_COMMUNITY): Payer: Self-pay

## 2015-03-11 NOTE — Telephone Encounter (Signed)
Patient left VM on triage line concerning paperwork that was supposed to be faxed to our office for a MAP (med assist program) program through [employer]  Left VM back to patient that we have not received any paperwork from this program, also left her our phone number and fax number in case she wanted to relay this to the representative who is handling her MAP program paperwork.    Kristen Snyder

## 2015-03-13 ENCOUNTER — Encounter (HOSPITAL_COMMUNITY): Payer: Medicaid Other

## 2015-03-15 ENCOUNTER — Encounter (HOSPITAL_COMMUNITY): Payer: Medicaid Other

## 2015-03-18 ENCOUNTER — Encounter (HOSPITAL_COMMUNITY): Payer: Medicaid Other

## 2015-03-20 ENCOUNTER — Encounter (HOSPITAL_COMMUNITY): Payer: Medicaid Other

## 2015-03-22 ENCOUNTER — Encounter (HOSPITAL_COMMUNITY): Payer: Medicaid Other

## 2015-04-03 ENCOUNTER — Telehealth: Payer: Self-pay | Admitting: Cardiology

## 2015-04-04 NOTE — Telephone Encounter (Signed)
Close encounter 

## 2015-04-08 NOTE — Progress Notes (Signed)
HPI: FU cardiomyopathy/CHF. Echo 11/15 showed EF 10-15, restrictive filling, mild LAE, mild MR, moderately elevated pulmonary pressure. TSH normal. Cath 11/15 showed normal cors, EF 15, LVEDP 25. Admitted with CHF 12/15 which improved with diuresis and meds. Since she was last seen, the patient denies any dyspnea on exertion, orthopnea, PND, pedal edema, palpitations, syncope or chest pain.   Current Outpatient Prescriptions  Medication Sig Dispense Refill  . aspirin EC 81 MG tablet Take 1 tablet (81 mg total) by mouth daily. 30 tablet 2  . carvedilol (COREG) 12.5 MG tablet Take 1.5 tablets (18.75 mg total) by mouth 2 (two) times daily with a meal. PLEASE MAKE APPOINTMENT FOR FUTURE REFILLS 90 tablet 1  . digoxin (LANOXIN) 0.125 MG tablet Take 1 tablet (0.125 mg total) by mouth daily. 30 tablet 2  . furosemide (LASIX) 80 MG tablet Take 1 tablet (80 mg total) by mouth daily. May take an extra 80 mg for 3 pound weight gain in 24 hours 35 tablet 2  . Insulin Glargine (LANTUS SOLOSTAR) 100 UNIT/ML Solostar Pen Inject 70 Units into the skin daily at 10 pm. (Patient taking differently: Inject 80 Units into the skin daily at 10 pm. ) 15 mL 11  . lisinopril (PRINIVIL,ZESTRIL) 20 MG tablet Take 1 tablet (20 mg total) by mouth 2 (two) times daily. 60 tablet 2  . spironolactone (ALDACTONE) 25 MG tablet Take 1 tablet (25 mg total) by mouth daily. 30 tablet 2   No current facility-administered medications for this visit.     Past Medical History  Diagnosis Date  . Diabetes mellitus   . Hypertension   . Hyperlipidemia   . NICM (nonischemic cardiomyopathy)     a) LHC (10/08/14) Normal coronaries, mild plaque in mid LAD, EF 15%  . Chronic systolic heart failure     a) RHC (10/08/14): RA: 7, RV: 40/5, RVEDP: 9, PA: 43/23 (32), unable wedge, PA: 62%, CO/CI: 4.9/ 2.69; b) Echo (10/05/2014): EF 10-15%, mild MR directed centrally    Past Surgical History  Procedure Laterality Date  . Knee surgery     . Shoulder surgery    . Left and right heart catheterization with coronary angiogram [name] 10/08/2014    Procedure: LEFT AND RIGHT HEART CATHETERIZATION WITH CORONARY ANGIOGRAM;  Surgeon: Jettie Booze, MD;  Location: [facility] CATH LAB;  Service: Cardiovascular;  Laterality: [name];    History   Social History  . Marital Status: Married    Spouse Name: [name]  . Number of Children: 1  . Years of Education: [name]   Occupational History  .      [employer]   Social History Main Topics  . Smoking status: Never Smoker   . Smokeless tobacco: Not on file  . Alcohol Use: 0.0 oz/week    0 Standard drinks or equivalent per week     Comment: Rare  . Drug Use: No  . Sexual Activity: Not on file   Other Topics Concern  . Not on file   Social History Narrative    ROS: no fevers or chills, productive cough, hemoptysis, dysphasia, odynophagia, melena, hematochezia, dysuria, hematuria, rash, seizure activity, orthopnea, PND, pedal edema, claudication. Remaining systems are negative.  Physical Exam: Well-developed well-nourished in no acute distress.  Skin is warm and dry.  HEENT is normal.  Neck is supple.  Chest is clear to auscultation with normal expansion.  Cardiovascular exam is regular rate and rhythm.  Abdominal exam nontender or distended. No masses  palpated. Extremities show no edema. neuro grossly intact

## 2015-04-12 ENCOUNTER — Encounter: Payer: Self-pay | Admitting: Cardiology

## 2015-04-12 ENCOUNTER — Ambulatory Visit (INDEPENDENT_AMBULATORY_CARE_PROVIDER_SITE_OTHER): Payer: Medicaid Other | Admitting: Cardiology

## 2015-04-12 VITALS — BP 110/76 | HR 75 | Ht 64.0 in | Wt 171.0 lb

## 2015-04-12 DIAGNOSIS — I429 Cardiomyopathy, unspecified: Secondary | ICD-10-CM

## 2015-04-12 DIAGNOSIS — Z79899 Other long term (current) drug therapy: Secondary | ICD-10-CM | POA: Diagnosis not present

## 2015-04-12 DIAGNOSIS — I5022 Chronic systolic (congestive) heart failure: Secondary | ICD-10-CM | POA: Diagnosis not present

## 2015-04-12 DIAGNOSIS — I428 Other cardiomyopathies: Secondary | ICD-10-CM

## 2015-04-12 NOTE — Assessment & Plan Note (Signed)
Continue present medications. Plan repeat echocardiogram to reassess LV function. If Ejection fraction remains decreased she may need to be evaluated for possible ICD.

## 2015-04-12 NOTE — Patient Instructions (Addendum)
Your physician recommends that you return for lab work in: Today at [facility].  Your physician has requested that you have an echocardiogram. Echocardiography is a painless test that uses sound waves to create images of your heart. It provides your doctor with information about the size and shape of your heart and how well your heart's chambers and valves are working. This procedure takes approximately one hour. There are no restrictions for this procedure.  Your physician recommends that you schedule a follow-up appointment in: 6 months.

## 2015-04-12 NOTE — Assessment & Plan Note (Signed)
Blood pressure controlled. Continue present medications. 

## 2015-04-12 NOTE — Assessment & Plan Note (Signed)
Patient is euvolemic on examination. Continue present dose of diabetics. Check potassium and renal function.

## 2015-04-13 LAB — BASIC METABOLIC PANEL
BUN: 8 mg/dL (ref 6–23)
CO2: 27 meq/L (ref 19–32)
CREATININE: 0.72 mg/dL (ref 0.50–1.10)
Calcium: 9.1 mg/dL (ref 8.4–10.5)
Chloride: 99 mEq/L (ref 96–112)
Glucose, Bld: 218 mg/dL — ABNORMAL HIGH (ref 70–99)
POTASSIUM: 4.1 meq/L (ref 3.5–5.3)
SODIUM: 138 meq/L (ref 135–145)

## 2015-04-19 ENCOUNTER — Ambulatory Visit (HOSPITAL_COMMUNITY)
Admission: RE | Admit: 2015-04-19 | Discharge: 2015-04-19 | Disposition: A | Discharge status: Home / Self Care | Payer: Medicaid Other | Source: Ambulatory Visit | Attending: Cardiovascular Disease | Admitting: Cardiovascular Disease

## 2015-04-19 DIAGNOSIS — I428 Other cardiomyopathies: Secondary | ICD-10-CM

## 2015-04-19 DIAGNOSIS — I429 Cardiomyopathy, unspecified: Secondary | ICD-10-CM | POA: Insufficient documentation

## 2015-04-23 ENCOUNTER — Other Ambulatory Visit (HOSPITAL_COMMUNITY): Payer: Self-pay | Admitting: *Deleted

## 2015-04-23 ENCOUNTER — Ambulatory Visit (HOSPITAL_COMMUNITY)
Admission: RE | Admit: 2015-04-23 | Discharge: 2015-04-23 | Disposition: A | Discharge status: Home / Self Care | Payer: Medicaid Other | Source: Ambulatory Visit | Attending: Internal Medicine | Admitting: Internal Medicine

## 2015-04-23 ENCOUNTER — Encounter (HOSPITAL_COMMUNITY): Payer: Self-pay

## 2015-04-23 VITALS — BP 114/72 | HR 74 | Wt 170.5 lb

## 2015-04-23 DIAGNOSIS — I10 Essential (primary) hypertension: Secondary | ICD-10-CM | POA: Insufficient documentation

## 2015-04-23 DIAGNOSIS — I5022 Chronic systolic (congestive) heart failure: Secondary | ICD-10-CM

## 2015-04-23 DIAGNOSIS — E785 Hyperlipidemia, unspecified: Secondary | ICD-10-CM | POA: Insufficient documentation

## 2015-04-23 DIAGNOSIS — I429 Cardiomyopathy, unspecified: Secondary | ICD-10-CM | POA: Insufficient documentation

## 2015-04-23 DIAGNOSIS — R0683 Snoring: Secondary | ICD-10-CM | POA: Diagnosis not present

## 2015-04-23 DIAGNOSIS — E119 Type 2 diabetes mellitus without complications: Secondary | ICD-10-CM | POA: Insufficient documentation

## 2015-04-23 DIAGNOSIS — Z794 Long term (current) use of insulin: Secondary | ICD-10-CM | POA: Insufficient documentation

## 2015-04-23 DIAGNOSIS — Z9119 Patient's noncompliance with other medical treatment and regimen: Secondary | ICD-10-CM | POA: Diagnosis not present

## 2015-04-23 DIAGNOSIS — Z7982 Long term (current) use of aspirin: Secondary | ICD-10-CM | POA: Insufficient documentation

## 2015-04-23 DIAGNOSIS — Z79899 Other long term (current) drug therapy: Secondary | ICD-10-CM | POA: Insufficient documentation

## 2015-04-23 DIAGNOSIS — I5023 Acute on chronic systolic (congestive) heart failure: Secondary | ICD-10-CM

## 2015-04-23 MED ORDER — CARVEDILOL 12.5 MG PO TABS: 18.7500 mg | ORAL_TABLET | Freq: Two times a day (BID) | ORAL | Status: DC

## 2015-04-23 MED ORDER — SPIRONOLACTONE 25 MG PO TABS: 25.0000 mg | ORAL_TABLET | Freq: Every day | ORAL | Status: DC

## 2015-04-23 MED ORDER — FUROSEMIDE 80 MG PO TABS: 80.0000 mg | ORAL_TABLET | ORAL | Status: DC | PRN

## 2015-04-23 NOTE — Progress Notes (Signed)
Patient ID: Kristen Snyder, female   DOB: [birth year], 49 y.o.   MRN: [id number] PCP: Jackson Latino [facility]). Previously followed by Dr. Criss Rosales Primary Cardiologist: Stanford Breed  HPI: Kristen Snyder is a 49 y/o woman with DM, HTN, chronic systolic heart failure, and HL. She developed dyspnea in 10/15. ECHO with EF 10-15% Saw Dr. Stanford Breed who scheduled [facility] on 10/08/14 with Dr. Irish Lack.   Admitted to [facility] 10/19/14 from the HF clinic with an acute hf exacerbation and class IV symptoms. Diuresed with lasix and transitioned to lasix 80 mg daily. HF meds adjusted and she was later discharged home 169 pounds.   She returns for follow up. Last visit carvedilol was increased to 12.5  mg twice a day. Denies SOB/PND/Orthopnea. Taking all medications. Weight at home 166-168 pounds. Walking 35 minutes. Able to walk up steps.  Following low salt diet and drinking < than 2 liters of fluid. Working full time again.   Cath 10/08/14 Minimal CAD EF 15% with LVEDP 25 RA  7 RV 40/5/9 PA 43/23 (32) Unable to wedge PA sat 62% CO 4.9/2.7  ECHO 10/05/14 EF 10-15%  ECHO 04/19/2015 EF 50-55%   Labs 10/23/14 K 4.5 Creatinine 0.63, HCT 35.1   SH: Lives at home with husband and 78 year old daughter. 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  FH: Sister ? Abnormal heart  ROS: All systems negative except as listed in HPI, PMH and Problem List.  SH:  History   Social History  . Marital Status: Married    Spouse Name: [name]  . Number of Children: 1  . Years of Education: [name]   Occupational History  .      [employer]   Social History Main Topics  . Smoking status: Never Smoker   . Smokeless tobacco: Not on file  . Alcohol Use: 0.0 oz/week    0 Standard drinks or equivalent per week     Comment: Rare  . Drug Use: No  . Sexual Activity: Not on file   Other Topics Concern  . Not on file   Social History Narrative    FH:  Family History  Problem Relation Age of Onset  . Heart disease      No  family history  . Diabetes Mother   . Hypertension Mother   . Hypertension Father   . Diabetes Father     Past Medical History  Diagnosis Date  . Diabetes mellitus   . Hypertension   . Hyperlipidemia   . NICM (nonischemic cardiomyopathy)     a) LHC (10/08/14) Normal coronaries, mild plaque in mid LAD, EF 15%  . Chronic systolic heart failure     a) RHC (10/08/14): RA: 7, RV: 40/5, RVEDP: 9, PA: 43/23 (32), unable wedge, PA: 62%, CO/CI: 4.9/ 2.69; b) Echo (10/05/2014): EF 10-15%, mild MR directed centrally    Current Outpatient Prescriptions  Medication Sig Dispense Refill  . aspirin EC 81 MG tablet Take 1 tablet (81 mg total) by mouth daily. 30 tablet 2  . carvedilol (COREG) 12.5 MG tablet Take 1.5 tablets (18.75 mg total) by mouth 2 (two) times daily with a meal. PLEASE MAKE APPOINTMENT FOR FUTURE REFILLS 90 tablet 1  . digoxin (LANOXIN) 0.125 MG tablet Take 1 tablet (0.125 mg total) by mouth daily. 30 tablet 2  . furosemide (LASIX) 80 MG tablet Take 1 tablet (80 mg total) by mouth daily. May take an extra 80 mg for 3 pound weight gain in 24 hours 35  tablet 2  . Insulin Glargine (LANTUS SOLOSTAR) 100 UNIT/ML Solostar Pen Inject 70 Units into the skin daily at 10 pm. 15 mL 11  . lisinopril (PRINIVIL,ZESTRIL) 20 MG tablet Take 1 tablet (20 mg total) by mouth 2 (two) times daily. 60 tablet 2  . spironolactone (ALDACTONE) 25 MG tablet Take 1 tablet (25 mg total) by mouth daily. 30 tablet 2   No current facility-administered medications for this encounter.    Filed Vitals:   04/23/15 1125  BP: 114/72  Pulse: 74  Weight: 170 lb 8 oz (77.338 kg)  SpO2: 98%    PHYSICAL EXAM: General:  Well appearing. No resp difficulty. No difficulty ambulating in the clinic.  HEENT: normal Neck: supple. JVP 5-6. Carotids 2+ bilaterally; no bruits. No lymphadenopathy or thryomegaly appreciated. Cor: PMI normal. Tachycardic. Regular.  Lungs: clear Abdomen: soft, nontender, mildly distended. No  hepatosplenomegaly. No bruits or masses. Good bowel sounds. Extremities: no cyanosis, clubbing, rash, edema Neuro: alert & orientedx3, cranial nerves grossly intact. Moves all 4 extremities w/o difficulty. Affect pleasant.   ASSESSMENT & PLAN: 1.Chronic Systolic HF due to NICM, ?viral myocarditis. Onset 10/15. ECHO 10/05/14 EF 10-15%> then improved 04/19/2015 to  50-55%  NYHA I. Volume status stable.  - Switch lasix to prn only. Continue 25 mg spironolactone daily.  -Continue carvedilol to 12.5  mg twice a day  Stop digoxin. EF recovered.  -On lisinopril 20 mg twice a day -Intolerant of Bidil due to dizziness and disorientation.  - Reinforced daily weights, low salt food choices, and limiting fluid intake to < 2 liters per day.  2. HTN- Ok. Contiinue current dose ofl, lisinopril, spironolactone. Increae carvedilol as above.   3. DM2- On insulin.  Does not have glucometer.  PCP 4. Noncompliance- Taking all medications.   5. Snoring - probable OSA. Consider referral to pulmonary later. She would like to hold off.   CLEGG,AMY NP-C  12:29 PM   Patient seen and examined with Darrick Grinder, NP. We discussed all aspects of the encounter. I agree with the assessment and plan as stated above.   Her HF has resolved. Echo reviewed with her personally and EF now back in normal range. She is NYHA I. Volume status looks good. Will stop digoxin and change lasix to prn only. Will have her f/u with [facility]. Can return to HF Clinic as needed.   Uzma Hellmer,MD 5:12 PM

## 2015-04-23 NOTE — Progress Notes (Signed)
Advanced Heart Failure Medication Review by a Pharmacist  Does the patient  feel that his/her medications are working for him/her?  yes  Has the patient been experiencing any side effects to the medications prescribed?  no  Does the patient measure his/her own blood pressure or blood glucose at home?  no   Does the patient have any problems obtaining medications due to transportation or finances?   no  Understanding of regimen: good Understanding of indications: good Potential of compliance: good    Pharmacist comments: Patient presents to heart failure clinic and medications were reviewed with a pharmacist. No medication discrepancies noted and patient does not have any questions at this time.   Megan E. Supple, Pharm.D Clinical Pharmacy Resident Pager: [phone] 04/23/2015 11:38 AM

## 2015-04-23 NOTE — Patient Instructions (Addendum)
STOP Digoxin.  DECREASE Lasix to as needed.   FOLLOW UP with Dr.Crenshaw in 6 months.

## 2015-04-24 ENCOUNTER — Telehealth: Payer: Self-pay | Admitting: Cardiology

## 2015-04-24 NOTE — Telephone Encounter (Signed)
Pt returning call from Monday and Tuesday,she thinks it is about her echo test results.

## 2015-04-24 NOTE — Telephone Encounter (Signed)
Echo results reported, advice/explanation given on current meds - pt voiced understanding.

## 2015-05-23 ENCOUNTER — Other Ambulatory Visit (HOSPITAL_COMMUNITY): Payer: Self-pay | Admitting: Cardiology

## 2015-05-27 ENCOUNTER — Telehealth: Payer: Self-pay | Admitting: Cardiology

## 2015-05-27 NOTE — Telephone Encounter (Signed)
Per Answering Service: Should she keep taking Carvedilol?

## 2015-05-27 NOTE — Telephone Encounter (Signed)
LMTCB

## 2015-06-06 ENCOUNTER — Other Ambulatory Visit: Payer: Self-pay | Admitting: *Deleted

## 2015-06-06 DIAGNOSIS — I5023 Acute on chronic systolic (congestive) heart failure: Secondary | ICD-10-CM

## 2015-06-06 MED ORDER — CARVEDILOL 12.5 MG PO TABS: 18.7500 mg | ORAL_TABLET | Freq: Two times a day (BID) | ORAL | Status: DC

## 2015-06-06 MED ORDER — LISINOPRIL 20 MG PO TABS: 20.0000 mg | ORAL_TABLET | Freq: Two times a day (BID) | ORAL | Status: DC

## 2015-06-06 MED ORDER — SPIRONOLACTONE 25 MG PO TABS: 25.0000 mg | ORAL_TABLET | Freq: Every day | ORAL | Status: DC

## 2015-06-10 ENCOUNTER — Other Ambulatory Visit: Payer: Self-pay

## 2015-06-10 DIAGNOSIS — I5023 Acute on chronic systolic (congestive) heart failure: Secondary | ICD-10-CM

## 2015-06-10 MED ORDER — SPIRONOLACTONE 25 MG PO TABS: 25.0000 mg | ORAL_TABLET | Freq: Every day | ORAL | Status: DC

## 2015-06-10 MED ORDER — LISINOPRIL 20 MG PO TABS: 20.0000 mg | ORAL_TABLET | Freq: Two times a day (BID) | ORAL | Status: DC

## 2015-06-10 MED ORDER — CARVEDILOL 12.5 MG PO TABS: 18.7500 mg | ORAL_TABLET | Freq: Two times a day (BID) | ORAL | Status: DC

## 2015-06-10 NOTE — Telephone Encounter (Signed)
Rx(s) sent to pharmacy electronically.  

## 2015-06-21 ENCOUNTER — Telehealth: Payer: Self-pay | Admitting: Cardiology

## 2015-06-21 MED ORDER — CARVEDILOL 12.5 MG PO TABS: 12.5000 mg | ORAL_TABLET | Freq: Two times a day (BID) | ORAL | Status: DC

## 2015-06-21 NOTE — Telephone Encounter (Signed)
Patient has a question regarding her carvedilol 12.5 mg refill she just received.  She thought her dosage was being cut.  Please leave detailed message on home phone.

## 2015-06-21 NOTE — Telephone Encounter (Signed)
Left detailed message for pt, dose of carvedilol is 12.5 mg twice daily. Change made on medication list.

## 2015-06-24 ENCOUNTER — Telehealth: Payer: Self-pay | Admitting: Cardiology

## 2015-06-24 NOTE — Telephone Encounter (Signed)
Spoke with pt, directions discussed and questions answered.

## 2015-06-24 NOTE — Telephone Encounter (Signed)
Pt called in stating that the directions to her Carvedilol has been increased from 1 tab po bid to 1 1/2 tab po bid. She wanted to know if this was intentional because she thought that Dr. Stanford Breed was trying to [facility] her off of the medication. Please call  Thanks

## 2015-09-10 ENCOUNTER — Other Ambulatory Visit: Payer: Self-pay

## 2015-09-10 DIAGNOSIS — Z1231 Encounter for screening mammogram for malignant neoplasm of breast: Secondary | ICD-10-CM

## 2015-09-10 LAB — PROCEDURE REPORT - SCANNED: Pap: NEGATIVE

## 2015-09-11 ENCOUNTER — Ambulatory Visit
Admission: RE | Admit: 2015-09-11 | Discharge: 2015-09-11 | Disposition: A | Discharge status: Home / Self Care | Payer: Medicaid Other | Source: Ambulatory Visit

## 2015-09-11 DIAGNOSIS — Z1231 Encounter for screening mammogram for malignant neoplasm of breast: Secondary | ICD-10-CM

## 2015-09-23 ENCOUNTER — Other Ambulatory Visit: Payer: Self-pay | Admitting: Obstetrics

## 2015-10-02 ENCOUNTER — Other Ambulatory Visit: Payer: Self-pay | Admitting: Obstetrics

## 2015-10-02 DIAGNOSIS — R928 Other abnormal and inconclusive findings on diagnostic imaging of breast: Secondary | ICD-10-CM

## 2015-10-09 ENCOUNTER — Other Ambulatory Visit: Payer: Self-pay | Admitting: Obstetrics

## 2015-10-09 ENCOUNTER — Ambulatory Visit
Admission: RE | Admit: 2015-10-09 | Discharge: 2015-10-09 | Disposition: A | Discharge status: Home / Self Care | Payer: Medicaid Other | Source: Ambulatory Visit | Attending: Obstetrics | Admitting: Obstetrics

## 2015-10-09 DIAGNOSIS — R921 Mammographic calcification found on diagnostic imaging of breast: Secondary | ICD-10-CM

## 2015-10-09 DIAGNOSIS — R928 Other abnormal and inconclusive findings on diagnostic imaging of breast: Secondary | ICD-10-CM

## 2015-10-14 ENCOUNTER — Ambulatory Visit
Admission: RE | Admit: 2015-10-14 | Discharge: 2015-10-14 | Disposition: A | Discharge status: Home / Self Care | Payer: Medicaid Other | Source: Ambulatory Visit | Attending: Obstetrics | Admitting: Obstetrics

## 2015-10-14 DIAGNOSIS — R921 Mammographic calcification found on diagnostic imaging of breast: Secondary | ICD-10-CM

## 2015-10-14 NOTE — Progress Notes (Signed)
HPI:  FU cardiomyopathy/CHF. Echo 11/15 showed EF 10-15, restrictive filling, mild LAE, mild MR, moderately elevated pulmonary pressure. TSH normal. Cath 11/15 showed normal cors, EF 15, LVEDP 25. Admitted with CHF 12/15 which improved with diuresis and meds. Echocardiogram repeated June 2016 and showed ejection fraction 50-55% and grade 1 diastolic dysfunction. Since she was last seen, the patient denies any dyspnea on exertion, orthopnea, PND, pedal edema, palpitations, syncope or chest pain.   Current Outpatient Prescriptions  Medication Sig Dispense Refill  . aspirin EC 81 MG tablet Take 1 tablet (81 mg total) by mouth daily. 30 tablet 2  . carvedilol (COREG) 12.5 MG tablet Take 1 tablet (12.5 mg total) by mouth 2 (two) times daily with a meal. 270 tablet 3  . furosemide (LASIX) 80 MG tablet Take 1 tablet (80 mg total) by mouth as needed. May take an extra 80 mg for 3 pound weight gain in 24 hours 35 tablet 2  . insulin detemir (LEVEMIR) 100 UNIT/ML injection Inject 60 Units into the skin at bedtime.    Marland Kitchen lisinopril (PRINIVIL,ZESTRIL) 20 MG tablet Take 1 tablet (20 mg total) by mouth 2 (two) times daily. 180 tablet 3  . spironolactone (ALDACTONE) 25 MG tablet Take 1 tablet (25 mg total) by mouth daily. 90 tablet 3   No current facility-administered medications for this visit.     Past Medical History  Diagnosis Date  . Diabetes mellitus ([place])   . Hypertension   . Hyperlipidemia   . NICM (nonischemic cardiomyopathy) ([place])     a) LHC (10/08/14) Normal coronaries, mild plaque in mid LAD, EF 15%  . Chronic systolic heart failure ([place])     a) RHC (10/08/14): RA: 7, RV: 40/5, RVEDP: 9, PA: 43/23 (32), unable wedge, PA: 62%, CO/CI: 4.9/ 2.69; b) Echo (10/05/2014): EF 10-15%, mild MR directed centrally    Past Surgical History  Procedure Laterality Date  . Knee surgery    . Shoulder surgery    . Left and right heart catheterization with coronary angiogram [name] 10/08/2014   Procedure: LEFT AND RIGHT HEART CATHETERIZATION WITH CORONARY ANGIOGRAM;  Surgeon: Jettie Booze, MD;  Location: [facility] CATH LAB;  Service: Cardiovascular;  Laterality: [name];    Social History   Social History  . Marital Status: Married    Spouse Name: [name]  . Number of Children: 1  . Years of Education: [name]   Occupational History  .      [employer]   Social History Main Topics  . Smoking status: Never Smoker   . Smokeless tobacco: Not on file  . Alcohol Use: 0.0 oz/week    0 Standard drinks or equivalent per week     Comment: Rare  . Drug Use: No  . Sexual Activity: Not on file   Other Topics Concern  . Not on file   Social History Narrative    ROS: no fevers or chills, productive cough, hemoptysis, dysphasia, odynophagia, melena, hematochezia, dysuria, hematuria, rash, seizure activity, orthopnea, PND, pedal edema, claudication. Remaining systems are negative.  Physical Exam: Well-developed well-nourished in no acute distress.  Skin is warm and dry.  HEENT is normal.  Neck is supple.  Chest is clear to auscultation with normal expansion.  Cardiovascular exam is regular rate and rhythm.  Abdominal exam nontender or distended. No masses palpated. Extremities show no edema. neuro grossly intact  ECG Normal sinus rhythm at a rate of 86. Left ventricular hypertrophy. Nonspecific ST changes.

## 2015-10-16 ENCOUNTER — Other Ambulatory Visit: Payer: Self-pay | Admitting: Obstetrics

## 2015-10-16 ENCOUNTER — Encounter: Payer: Self-pay | Admitting: Cardiology

## 2015-10-16 ENCOUNTER — Ambulatory Visit (INDEPENDENT_AMBULATORY_CARE_PROVIDER_SITE_OTHER): Payer: Medicaid Other | Admitting: Cardiology

## 2015-10-16 VITALS — BP 120/70 | HR 86 | Ht 64.0 in | Wt 187.3 lb

## 2015-10-16 DIAGNOSIS — I10 Essential (primary) hypertension: Secondary | ICD-10-CM | POA: Diagnosis not present

## 2015-10-16 DIAGNOSIS — I429 Cardiomyopathy, unspecified: Secondary | ICD-10-CM

## 2015-10-16 DIAGNOSIS — R921 Mammographic calcification found on diagnostic imaging of breast: Secondary | ICD-10-CM

## 2015-10-16 DIAGNOSIS — I5022 Chronic systolic (congestive) heart failure: Secondary | ICD-10-CM | POA: Diagnosis not present

## 2015-10-16 DIAGNOSIS — E785 Hyperlipidemia, unspecified: Secondary | ICD-10-CM

## 2015-10-16 DIAGNOSIS — I428 Other cardiomyopathies: Secondary | ICD-10-CM

## 2015-10-16 LAB — BASIC METABOLIC PANEL
BUN: 12 mg/dL (ref 7–25)
CHLORIDE: 101 mmol/L (ref 98–110)
CO2: 24 mmol/L (ref 20–31)
CREATININE: 0.71 mg/dL (ref 0.50–1.05)
Calcium: 9.4 mg/dL (ref 8.6–10.4)
GLUCOSE: 211 mg/dL — AB (ref 65–99)
Potassium: 4.3 mmol/L (ref 3.5–5.3)
Sodium: 136 mmol/L (ref 135–146)

## 2015-10-16 NOTE — Assessment & Plan Note (Signed)
Improved on most recent echocardiogram. Continue ACE inhibitor and beta blocker.

## 2015-10-16 NOTE — Assessment & Plan Note (Signed)
Blood pressure controlled. Continue present medications. Check potassium and renal function. 

## 2015-10-16 NOTE — Patient Instructions (Signed)
Medication Instructions:   NO CHANGE  Labwork:  Your physician recommends that you HAVE LAB WORK TODAY  Follow-Up:  Your physician wants you to follow-up in: ONE YEAR WITH DR CRENSHAW You will receive a reminder letter in the mail two months in advance. If you don't receive a letter, please call our office to schedule the follow-up appointment.   If you need a refill on your cardiac medications before your next appointment, please call your pharmacy.    

## 2015-10-16 NOTE — Assessment & Plan Note (Signed)
Management per primary care. 

## 2015-10-16 NOTE — Assessment & Plan Note (Signed)
Patient appears to be euvolemic on examination. LV function has improved. Continue present dose of diuretics. Check potassium and renal function.

## 2015-10-23 ENCOUNTER — Ambulatory Visit
Admission: RE | Admit: 2015-10-23 | Discharge: 2015-10-23 | Disposition: A | Discharge status: Home / Self Care | Payer: Medicaid Other | Source: Ambulatory Visit | Attending: Obstetrics | Admitting: Obstetrics

## 2015-10-23 ENCOUNTER — Other Ambulatory Visit: Payer: Self-pay | Admitting: Obstetrics

## 2015-10-23 DIAGNOSIS — R921 Mammographic calcification found on diagnostic imaging of breast: Secondary | ICD-10-CM

## 2015-10-25 ENCOUNTER — Telehealth: Payer: Self-pay | Admitting: *Deleted

## 2015-10-25 NOTE — Telephone Encounter (Signed)
Called pt to discuss [facility] for 12/14. Pt relate she would like to keep her original appt with Dr. Excell Seltzer on 12/15. Informed [facility] of pt decision.

## 2015-11-05 ENCOUNTER — Other Ambulatory Visit: Payer: Self-pay | Admitting: General Surgery

## 2015-11-05 DIAGNOSIS — D0512 Intraductal carcinoma in situ of left breast: Secondary | ICD-10-CM

## 2015-11-13 ENCOUNTER — Other Ambulatory Visit: Payer: Medicaid Other

## 2015-11-13 ENCOUNTER — Ambulatory Visit
Admission: RE | Admit: 2015-11-13 | Discharge: 2015-11-13 | Disposition: A | Discharge status: Home / Self Care | Payer: Medicaid Other | Source: Ambulatory Visit | Attending: General Surgery | Admitting: General Surgery

## 2015-11-13 DIAGNOSIS — D0512 Intraductal carcinoma in situ of left breast: Secondary | ICD-10-CM

## 2015-11-13 MED ORDER — GADOBENATE DIMEGLUMINE 529 MG/ML IV SOLN
17.0000 mL | Freq: Once | INTRAVENOUS | Status: AC | PRN
Start: 2015-11-13 — End: 2015-11-13
  Administered 2015-11-13: 17 mL via INTRAVENOUS

## 2015-11-17 DIAGNOSIS — C50919 Malignant neoplasm of unspecified site of unspecified female breast: Secondary | ICD-10-CM

## 2015-11-17 HISTORY — DX: Malignant neoplasm of unspecified site of unspecified female breast: C50.919

## 2015-11-17 HISTORY — PX: BREAST LUMPECTOMY: SHX2

## 2015-11-21 ENCOUNTER — Other Ambulatory Visit: Payer: Self-pay | Admitting: General Surgery

## 2015-11-21 DIAGNOSIS — N6092 Unspecified benign mammary dysplasia of left breast: Secondary | ICD-10-CM

## 2015-11-21 DIAGNOSIS — D0512 Intraductal carcinoma in situ of left breast: Secondary | ICD-10-CM

## 2015-11-26 ENCOUNTER — Encounter: Payer: Self-pay | Admitting: Radiation Oncology

## 2015-11-26 NOTE — Progress Notes (Signed)
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   Diagnosis 10/14/15 : Breast, left, needle core biopsy, lower inner quadrant - LOBULAR NEOPLASIA (ATYPICAL LOBULAR HYPERPLASIA).- FIBROCYSTIC CHANGES WITH ADENOSIS, USUAL DUCTAL HYPERPLASIA AND CALCIFICATIONS.  Receptor Status: ER(100%+), PR (5% +), Her2-neu ()  Did patient present with symptoms (if so, please note symptoms) or was this found on screening mammography?: routine screning  Past/Anticipated interventions by surgeon, if any:Dr. Excell Seltzer, MD  Past/Anticipated interventions by medical oncology, if any: Chemotherapy   Lymphedema issues, if any:    Pain issues, if any:    SAFETY ISSUES:  Prior radiation?   Pacemaker/ICD?   Possible current pregnancy?  Is the patient on methotrexate?   Current Complaints / other details:  Married, Menarche age 50,  G43P1, ,, HX CHF,cardiomyopathy,DJM,HTN, non smoker, no alcohol,    Mother Colon Cancer, HTN,DM, Father  Prostate Cancer, HTN, Aero Drummonds, Felicita Gage, RN 11/26/2015,9:55 AM  Allergies: Bidil(Isosorb dinitrate hydralazine)

## 2015-11-26 NOTE — Progress Notes (Signed)
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   Diagnosis 10/14/15 : Breast, left, needle core biopsy, lower inner quadrant - LOBULAR NEOPLASIA (ATYPICAL LOBULAR HYPERPLASIA).- FIBROCYSTIC CHANGES WITH ADENOSIS, USUAL DUCTAL HYPERPLASIA AND CALCIFICATIONS.  Receptor Status: ER(100%+), PR (5% +), Her2-neu ()  Did patient present with symptoms (if so, please note symptoms) or was this found on screening mammography?:   Past/Anticipated interventions by surgeon, if any:  Past/Anticipated interventions by medical oncology, if any: Chemotherapy   Lymphedema issues, if any:    Pain issues, if any:    SAFETY ISSUES:  Prior radiation?   Pacemaker/ICD?   Possible current pregnancy?  Is the patient on methotrexate?   Current Complaints / other details:  Married, 1 daughter,, HX CHF,cardiomyopathy,DJM,HTN, non smoker, no alcohol,    Chaela Branscum, Felicita Gage, RN 11/26/2015,8:44 AM

## 2015-11-27 ENCOUNTER — Ambulatory Visit
Admission: RE | Admit: 2015-11-27 | Discharge: 2015-11-27 | Disposition: A | Discharge status: Home / Self Care | Payer: Medicaid Other | Source: Ambulatory Visit | Attending: Radiation Oncology | Admitting: Radiation Oncology

## 2015-11-27 DIAGNOSIS — Z79899 Other long term (current) drug therapy: Secondary | ICD-10-CM | POA: Insufficient documentation

## 2015-11-27 DIAGNOSIS — Z809 Family history of malignant neoplasm, unspecified: Secondary | ICD-10-CM | POA: Insufficient documentation

## 2015-11-27 DIAGNOSIS — Z794 Long term (current) use of insulin: Secondary | ICD-10-CM | POA: Insufficient documentation

## 2015-11-27 DIAGNOSIS — Z7982 Long term (current) use of aspirin: Secondary | ICD-10-CM | POA: Insufficient documentation

## 2015-11-27 DIAGNOSIS — E119 Type 2 diabetes mellitus without complications: Secondary | ICD-10-CM | POA: Insufficient documentation

## 2015-11-27 DIAGNOSIS — D0512 Intraductal carcinoma in situ of left breast: Secondary | ICD-10-CM | POA: Insufficient documentation

## 2015-11-27 DIAGNOSIS — I110 Hypertensive heart disease with heart failure: Secondary | ICD-10-CM | POA: Insufficient documentation

## 2015-11-27 DIAGNOSIS — Z833 Family history of diabetes mellitus: Secondary | ICD-10-CM | POA: Insufficient documentation

## 2015-11-27 DIAGNOSIS — E785 Hyperlipidemia, unspecified: Secondary | ICD-10-CM | POA: Insufficient documentation

## 2015-11-27 DIAGNOSIS — I5022 Chronic systolic (congestive) heart failure: Secondary | ICD-10-CM | POA: Insufficient documentation

## 2015-11-27 DIAGNOSIS — I428 Other cardiomyopathies: Secondary | ICD-10-CM | POA: Insufficient documentation

## 2015-11-27 DIAGNOSIS — C50312 Malignant neoplasm of lower-inner quadrant of left female breast: Secondary | ICD-10-CM

## 2015-11-27 DIAGNOSIS — Z8249 Family history of ischemic heart disease and other diseases of the circulatory system: Secondary | ICD-10-CM | POA: Insufficient documentation

## 2015-11-27 HISTORY — DX: Malignant neoplasm of unspecified site of unspecified female breast: C50.919

## 2015-11-27 HISTORY — DX: Heart failure, unspecified: I50.9

## 2015-12-02 ENCOUNTER — Encounter: Payer: Self-pay | Admitting: Radiation Oncology

## 2015-12-02 ENCOUNTER — Ambulatory Visit
Admission: RE | Admit: 2015-12-02 | Discharge: 2015-12-02 | Disposition: A | Discharge status: Home / Self Care | Payer: Medicaid Other | Source: Ambulatory Visit | Attending: Radiation Oncology | Admitting: Radiation Oncology

## 2015-12-02 ENCOUNTER — Telehealth: Payer: Self-pay | Admitting: *Deleted

## 2015-12-02 DIAGNOSIS — E785 Hyperlipidemia, unspecified: Secondary | ICD-10-CM | POA: Diagnosis not present

## 2015-12-02 DIAGNOSIS — C50312 Malignant neoplasm of lower-inner quadrant of left female breast: Secondary | ICD-10-CM

## 2015-12-02 DIAGNOSIS — Z809 Family history of malignant neoplasm, unspecified: Secondary | ICD-10-CM | POA: Diagnosis not present

## 2015-12-02 DIAGNOSIS — Z833 Family history of diabetes mellitus: Secondary | ICD-10-CM | POA: Diagnosis not present

## 2015-12-02 DIAGNOSIS — C50512 Malignant neoplasm of lower-outer quadrant of left female breast: Secondary | ICD-10-CM | POA: Insufficient documentation

## 2015-12-02 DIAGNOSIS — Z79899 Other long term (current) drug therapy: Secondary | ICD-10-CM | POA: Diagnosis not present

## 2015-12-02 DIAGNOSIS — Z8249 Family history of ischemic heart disease and other diseases of the circulatory system: Secondary | ICD-10-CM | POA: Diagnosis not present

## 2015-12-02 DIAGNOSIS — I110 Hypertensive heart disease with heart failure: Secondary | ICD-10-CM | POA: Diagnosis not present

## 2015-12-02 DIAGNOSIS — Z7982 Long term (current) use of aspirin: Secondary | ICD-10-CM | POA: Diagnosis not present

## 2015-12-02 DIAGNOSIS — D0512 Intraductal carcinoma in situ of left breast: Secondary | ICD-10-CM | POA: Diagnosis not present

## 2015-12-02 DIAGNOSIS — E119 Type 2 diabetes mellitus without complications: Secondary | ICD-10-CM | POA: Diagnosis not present

## 2015-12-02 DIAGNOSIS — I5022 Chronic systolic (congestive) heart failure: Secondary | ICD-10-CM | POA: Diagnosis not present

## 2015-12-02 DIAGNOSIS — Z794 Long term (current) use of insulin: Secondary | ICD-10-CM | POA: Diagnosis not present

## 2015-12-02 DIAGNOSIS — I428 Other cardiomyopathies: Secondary | ICD-10-CM | POA: Diagnosis not present

## 2015-12-02 HISTORY — DX: Anxiety disorder, unspecified: F41.9

## 2015-12-02 NOTE — Progress Notes (Addendum)
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   Diagnosis 10/14/15 : Breast, left, needle core biopsy, lower inner quadrant - LOBULAR NEOPLASIA (ATYPICAL LOBULAR HYPERPLASIA).- FIBROCYSTIC CHANGES WITH ADENOSIS, USUAL DUCTAL HYPERPLASIA AND CALCIFICATIONS.  Receptor Status: ER(100%+), PR (5% +), Her2-neu ()  Did patient present with symptoms (if so, please note symptoms) or was this found on screening mammography?: routine screning  Past/Anticipated interventions by surgeon, if any:Dr. Excell Seltzer, MD, surgery to be scheduled in the middle of March 2017  Past/Anticipated interventions by medical oncology, if any: Chemotherapy None scheduled as yet  Lymphedema issues, if any:  No Pain issues, if any:  NO  SAFETY ISSUES:NO  Prior radiation? No  Pacemaker/ICD? NO  Possible current pregnancy? NO  Is the patient on methotrexate? NO  Current Complaints / other details:  Married, Menarche age 9,  G67P1, ,, HX CHF,cardiomyopathy,DJM,HTN, non smoker, no alcohol,   Going through menopause  Mother Colon Cancer, HTN,DM, Father  Prostate Cancer, HTN, Rebecca Eaton, RN 12/02/2015,2:21 PM  Allergies: Bidil(Isosorb dinitrate hydralazine) BP 141/72 mmHg  Pulse 80  Temp(Src) 98.5 F (36.9 C) (Oral)  Resp 16  Ht '5\' 4"'  (1.626 m)  Wt 185 lb 14.4 oz (84.324 kg)  BMI 31.89 kg/m2  Wt Readings from Last 3 Encounters:  12/02/15 185 lb 14.4 oz (84.324 kg)  10/16/15 187 lb 5 oz (84.964 kg)  04/23/15 170 lb 8 oz (77.338 kg)

## 2015-12-02 NOTE — Progress Notes (Signed)
Radiation Oncology         [phone] ________________________________  Name: Kristen Snyder MRN: [id number]  Date: 12/02/2015  DOB: July 21, 1966  RX:[id number],QPYPP D, MD  Excell Seltzer, MD     REFERRING PHYSICIAN: Excell Seltzer, MD   DIAGNOSIS: The encounter diagnosis was Breast cancer of lower-outer quadrant of left female breast ([place]).    ICD-9-CM ICD-10-CM   1. Breast cancer of lower-outer quadrant of left female breast ([place]) 174.5 C50.512      HISTORY OF PRESENT ILLNESS::Kristen Snyder is a 50 y.o. female with a history of left breast cancer. The patient underwent a screening mammogram in October 2016 revealing calcifications within the left breast. Further diagnostic images revealed features concerning for malignancy.   She underwent a left breast biopsy on 10/14/2015 which revealed lobular neoplasia (atypical lobular hyperplasia). 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   She met with Dr. Excell Seltzer and underwent a bilateral breast MRI on 11/13/2015 revealing abnormal though non-mass enhancement in the left lower breast from the central aspect of outer quadrant middle third depth measuring 4.3 cm from medial to lateral by 1.6 cm anterior to posterior by 1.4 cm superior to inferior. A biopsy clip was eating at the lateral margin of this. Along the far posterior aspect of the lower inner quadrant there was a 2.6 x 1 x 1 cm area of enhancement again non-masslike. Biopsy clip is not convincingly seen. No abnormal appearing lymph nodes were identified. She has plans to undergo left breast lumpectomy.   PREVIOUS RADIATION THERAPY: No   PAST MEDICAL HISTORY:  has a past medical history of Diabetes mellitus ([place]); Hypertension; Hyperlipidemia; NICM (nonischemic cardiomyopathy) ([place]); Chronic systolic heart failure ([place]); Breast cancer ([place]) (10/14/15); CHF (congestive heart  failure) ([place]); Cardiomyopathy ([place]); and Anxiety.     PAST SURGICAL HISTORY: Past Surgical History  Procedure Laterality Date  . Knee surgery    . Shoulder surgery    . Left and right heart catheterization with coronary angiogram [name] 10/08/2014    Procedure: LEFT AND RIGHT HEART CATHETERIZATION WITH CORONARY ANGIOGRAM;  Surgeon: Jettie Booze, MD;  Location: [facility] CATH LAB;  Service: Cardiovascular;  Laterality: [name];  . Breast biopsy Left 10/23/15    DCIS      FAMILY HISTORY: family history includes Cancer in her father and mother; Diabetes in her father and mother; Hypertension in her father and mother.   SOCIAL HISTORY:  reports that she has never smoked. She has never used smokeless tobacco. She reports that she drinks alcohol. She reports that she does not use illicit drugs.   ALLERGIES: Bidil   MEDICATIONS:  Current Outpatient Prescriptions  Medication Sig Dispense Refill  . aspirin EC 81 MG tablet Take 1 tablet (81 mg total) by mouth daily. 30 tablet 2  . carvedilol (COREG) 12.5 MG tablet Take 1 tablet (12.5 mg total) by mouth 2 (two) times daily with a meal. 270 tablet 3  . furosemide (LASIX) 80 MG tablet Take 1 tablet (80 mg total) by mouth as needed. May take an extra 80 mg for 3 pound weight gain in 24 hours 35 tablet 2  . insulin detemir (LEVEMIR) 100 UNIT/ML injection Inject 60 Units into the skin at bedtime.    Marland Kitchen lisinopril (PRINIVIL,ZESTRIL) 20 MG tablet Take 1 tablet (20 mg total) by mouth 2 (two) times daily. 180 tablet 3  . spironolactone (ALDACTONE) 25 MG tablet Take 1  tablet (25 mg total) by mouth daily. 90 tablet 3   No current facility-administered medications for this encounter.     REVIEW OF SYSTEMS:  A 15 point review of systems is documented in the electronic medical record. This was obtained by the nursing staff. However, I reviewed this with the patient to discuss relevant findings and make appropriate changes.  Pertinent items are noted in  HPI.  Denies pain. Denies any lymphedema issues. She is not currently scheduled with medical oncology. The patient was curious if breast cancer is precipitated by going through menopause.   PHYSICAL EXAM:  height is _0  (1.626 m) and weight is 185 lb 14.4 oz (84.324 kg). Her oral temperature is 98.5 F (36.9 C). Her blood pressure is 141/72 and her pulse is 80. Her respiration is 16.   ECOG = 1  0 - Asymptomatic (Fully active, able to carry on all predisease activities without restriction)  1 - Symptomatic but completely ambulatory (Restricted in physically strenuous activity but ambulatory and able to carry out work of a light or sedentary nature. For example, light housework, office work)  2 - Symptomatic, <50% in bed during the day (Ambulatory and capable of all self care but unable to carry out any work activities. Up and about more than 50% of waking hours)  3 - Symptomatic, >50% in bed, but not bedbound (Capable of only limited self-care, confined to bed or chair 50% or more of waking hours)  4 - Bedbound (Completely disabled. Cannot carry on any self-care. Totally confined to bed or chair)  5 - Death   Eustace Pen MM, Creech RH, Tormey DC, et al. [phone]). "Toxicity and response criteria of the [facility]". [place] Oncol. 5 (6): 649-55  General: Well-developed, in no acute distress HEENT: Normocephalic, atraumatic; oral cavity clear Neck: Supple without any lymphadenopathy Cardiovascular: Regular rate and rhythm Respiratory: Clear to auscultation bilaterally GI: Soft, nontender, normal bowel sounds Extremities: No edema present Neuro: No focal deficits   LABORATORY DATA:  Lab Results  Component Value Date   WBC 6.5 10/19/2014   HGB 10.8* 10/19/2014   HCT 35.1* 10/19/2014   MCV 62.3* 10/19/2014   PLT 403* 10/19/2014   Lab Results  Component Value Date   NA 136 10/16/2015   K 4.3 10/16/2015   CL 101 10/16/2015   CO2 24 10/16/2015   Lab  Results  Component Value Date   ALT 52* 10/19/2014   AST 35 10/19/2014   ALKPHOS 83 10/19/2014   BILITOT 0.4 10/19/2014      RADIOGRAPHY: Mr Breast Bilateral W Wo Contrast  11/13/2015  CLINICAL DATA:  This patient underwent 2 left breast biopsies for calcifications. The initial biopsy was along the far posterior lower slightly inner aspect of the left breast, which revealed atypical lobular hyperplasia as well as fibrocystic changes with adenosis. Subsequent biopsy of calcifications more anteriorly was then performed. Pathology revealed ductal carcinoma in situ. Current exam is to evaluate extent of disease. LABS:  None drawn at time of imaging. EXAM: BILATERAL BREAST MRI WITH AND WITHOUT CONTRAST TECHNIQUE: Multiplanar, multisequence MR images of both breasts were obtained prior to and following the intravenous administration of 17 ml of MultiHance. THREE-DIMENSIONAL MR IMAGE RENDERING ON INDEPENDENT WORKSTATION: Three-dimensional MR images were rendered by post-processing of the original MR data on an independent workstation. The three-dimensional MR images were interpreted, and findings are reported in the following complete MRI report for this study. Three dimensional images were evaluated at  the independent DynaCad workstation COMPARISON:  Previous exam(s). FINDINGS: Breast composition: c. Heterogeneous fibroglandular tissue. Background parenchymal enhancement: Mild Right breast: No mass or abnormal enhancement. Left breast: There is area of abnormal non mass enhancement in the lower left breast, from the central aspect to the outer quadrant, middle third depth, measuring approximately 4.3 cm from medial to lateral by 1.6 cm anterior-posterior by 1.4 cm from superior to inferior. Susceptibility artifact from the biopsy clip lies along the lateral margin of this. Along the far posterior aspect of the lower inner quadrant, there is an area of non masslike enhancement covering 2.6 x 1.0 x 1.0 cm. This  area appears as mixed signal with areas of fat signal and surrounding low T1 and T2 signal, some of which is likely post biopsy change. Biopsy clip is not convincingly seen. There are no other areas of abnormal enhancement. Lymph nodes: No abnormal appearing lymph nodes. Ancillary findings:  None. IMPRESSION: 1. Abnormal non masslike enhancement in the left breast is seen in 2 locations consistent with the previous to biopsy locations. The most extensive area is in the middle third of the left breast, lower outer quadrant, covering 4.3 x 1.6 x 1.4 cm. The smaller area is along the far posterior lower inner quadrant covering 2.6 x 1.0 x 1.0 cm. 2. No other abnormal enhancement is seen to suggest additional areas of atypia or malignancy. 3. Normal appearance of the right breast. No evidence of adenopathy. RECOMMENDATION: 1. Surgical excision of the area of DCIS central lower outer aspect of the left breast. 2. Surgical excision should be considered for the additional area of non mass enhancement along the posterior lower inner quadrant of the left breast, where the previous biopsy demonstrated atypical lobular hyperplasia. BI-RADS CATEGORY  6: Known biopsy-proven malignancy. Electronically Signed   By: Lajean Manes M.D.   On: 11/13/2015 16:09       IMPRESSION:  The patient has a recent diagnosis of DCIS of the left breast. She was felt to be a good candidate for breast conservation treatment and she will proceed with a lumpectomy when she is able to coordinate insurance coverage. She anticipates that the surgery will be completed in March of this year. We stressed that this would be important to proceed with surgery in the fairly near future. She expressed an understanding of this. The patient is a good candidate for adjuvant radiation treatment at the appropriate time.  I discussed with the patient the role of adjuvant radiation treatment in this setting. We discussed the potential benefit of radiation  treatment, especially with regards to local control of the patient's tumor. We also discussed the possible side effects and risks of such a treatment as well.  All of the patient's questions were answered. The patient wishes to proceed with radiation treatment.  PLAN: The patient will be scheduled for a simulation in the near future such that we can proceed with treatment planning. I anticipate treating the patient over 6.5 weeks. I look forward to seeing the patient postoperatively in late March or early April according to her current surgical plans.      ________________________________   Jodelle Gross, MD, PhD   **Disclaimer: This note was dictated with voice recognition software. Similar sounding words can inadvertently be transcribed and this note may contain transcription errors which may not have been corrected upon publication of note.**  This document serves as a record of services personally performed by Kyung Rudd, MD. It was created on his behalf  by Arlyce Harman, a trained medical scribe. The creation of this record is based on the scribe's personal observations and the provider's statements to them. This document has been checked and approved by the attending provider.

## 2015-12-02 NOTE — Telephone Encounter (Signed)
Okay for surgery, per dr Stanford Breed, for pt upcoming breast surgery.

## 2015-12-02 NOTE — Progress Notes (Signed)
Please see the Nurse Progress Note in the MD Initial Consult Encounter for this patient. 

## 2015-12-09 ENCOUNTER — Telehealth: Payer: Self-pay | Admitting: Radiation Oncology

## 2015-12-09 NOTE — Telephone Encounter (Signed)
The patient called earlier with questions about how long Dr. Lisbeth Renshaw would be willing to postpone her post surgical radiation therapy.

## 2015-12-09 NOTE — Telephone Encounter (Signed)
With the patient regarding her question as to whether or not she is under radiation further out from surgery other than 6-8 weeks. Dr. Lisbeth Renshaw said most comfortable with her treated. This was conveyed to the patient and she will let us know that she's having her surgery.

## 2015-12-12 ENCOUNTER — Other Ambulatory Visit: Payer: Self-pay | Admitting: General Surgery

## 2015-12-12 DIAGNOSIS — N6092 Unspecified benign mammary dysplasia of left breast: Secondary | ICD-10-CM

## 2015-12-13 ENCOUNTER — Telehealth: Payer: Self-pay | Admitting: Cardiology

## 2015-12-13 ENCOUNTER — Telehealth (HOSPITAL_COMMUNITY): Payer: Self-pay | Admitting: Cardiology

## 2015-12-13 ENCOUNTER — Telehealth: Payer: Self-pay | Admitting: *Deleted

## 2015-12-13 NOTE — Telephone Encounter (Signed)
Spoke with pt, number to the outpt pharmacy given.

## 2015-12-13 NOTE — Telephone Encounter (Signed)
Patient is calling to see if Dr Stanford Breed or his nurse Fredia Beets) knows of any assistance programs to help with expenses of medication. She is currently between jobs and does not have the income to buy her next month's prescriptions. She said last year she was able to get help with prescriptions from her specialist and wanted to know if there was a program similar to help with her current medications until she is able to find a job for the next month or so. She would like a call back from his nurse at [phone].  I let her know we currently do not carry samples for Furosemide, Carvedilol, Lisinopril, and Spironolactone because they have generic brands that are available in pharmacies. Once a medication is ok'd to have a generic, those companies no longer give samples to offices. She verbally agreed and said thank you for the information.

## 2015-12-13 NOTE — Telephone Encounter (Signed)
See other telephone note.  

## 2015-12-13 NOTE — Telephone Encounter (Signed)
NEw Message  Pt calling to speak w/ RN- pt stated that Chantel from Dr Haroldine Laws is handling her request for medication. Please advise.

## 2015-12-13 NOTE — Telephone Encounter (Signed)
Patient called to request assistance with medications as she has done in the past with the HF fund  Patient reports she is in a difficult spot financially and she cannot afford her medications even at the $3 co pay (umemployment has run out and is currently out of work)  unfortunately we are unable to assist with meds again since she has medicaid, will review other options for patient with HF pharmacist

## 2015-12-16 ENCOUNTER — Other Ambulatory Visit: Payer: Self-pay | Admitting: General Surgery

## 2015-12-16 DIAGNOSIS — N6092 Unspecified benign mammary dysplasia of left breast: Secondary | ICD-10-CM

## 2015-12-16 NOTE — Telephone Encounter (Addendum)
Spoke with patient about the possibility of certain pharmacies covering copay costs for Medicaid patients until they are able to pay them back. If her current pharmacy cannot, she can transfer her prescriptions to our Outpatient Pharmacy which will be able to cover the costs of her copays for a short time until she is able to find a job and pay back the cost of her copays.   Ruta Hinds. Velva Harman, PharmD, BCPS, CPP Clinical Pharmacist Pager: [phone] Phone: [phone] 12/16/2015 10:16 AM

## 2015-12-17 ENCOUNTER — Encounter (HOSPITAL_BASED_OUTPATIENT_CLINIC_OR_DEPARTMENT_OTHER): Payer: Self-pay | Admitting: *Deleted

## 2015-12-19 ENCOUNTER — Encounter (HOSPITAL_BASED_OUTPATIENT_CLINIC_OR_DEPARTMENT_OTHER)
Admission: RE | Admit: 2015-12-19 | Discharge: 2015-12-19 | Disposition: A | Discharge status: Home / Self Care | Payer: Medicaid Other | Source: Ambulatory Visit | Attending: General Surgery | Admitting: General Surgery

## 2015-12-19 DIAGNOSIS — I5023 Acute on chronic systolic (congestive) heart failure: Secondary | ICD-10-CM | POA: Insufficient documentation

## 2015-12-19 DIAGNOSIS — E785 Hyperlipidemia, unspecified: Secondary | ICD-10-CM | POA: Insufficient documentation

## 2015-12-19 DIAGNOSIS — I110 Hypertensive heart disease with heart failure: Secondary | ICD-10-CM | POA: Insufficient documentation

## 2015-12-19 DIAGNOSIS — Z01812 Encounter for preprocedural laboratory examination: Secondary | ICD-10-CM | POA: Insufficient documentation

## 2015-12-19 DIAGNOSIS — E119 Type 2 diabetes mellitus without complications: Secondary | ICD-10-CM | POA: Diagnosis not present

## 2015-12-19 DIAGNOSIS — I429 Cardiomyopathy, unspecified: Secondary | ICD-10-CM | POA: Diagnosis not present

## 2015-12-19 DIAGNOSIS — C50512 Malignant neoplasm of lower-outer quadrant of left female breast: Secondary | ICD-10-CM | POA: Insufficient documentation

## 2015-12-19 LAB — BASIC METABOLIC PANEL
Anion gap: 9 (ref 5–15)
BUN: 5 mg/dL — AB (ref 6–20)
CALCIUM: 8.9 mg/dL (ref 8.9–10.3)
CO2: 27 mmol/L (ref 22–32)
CREATININE: 0.66 mg/dL (ref 0.44–1.00)
Chloride: 103 mmol/L (ref 101–111)
GFR calc non Af Amer: >60 mL/min (ref >60)
GLUCOSE: 184 mg/dL — AB (ref 65–99)
Potassium: 4.5 mmol/L (ref 3.5–5.1)
Sodium: 139 mmol/L (ref 135–145)

## 2015-12-20 ENCOUNTER — Ambulatory Visit
Admission: RE | Admit: 2015-12-20 | Discharge: 2015-12-20 | Disposition: A | Discharge status: Home / Self Care | Payer: Medicaid Other | Source: Ambulatory Visit | Attending: General Surgery | Admitting: General Surgery

## 2015-12-20 DIAGNOSIS — N6092 Unspecified benign mammary dysplasia of left breast: Secondary | ICD-10-CM

## 2015-12-23 ENCOUNTER — Inpatient Hospital Stay: Admission: RE | Admit: 2015-12-23 | Payer: Medicaid Other | Source: Ambulatory Visit

## 2015-12-23 ENCOUNTER — Ambulatory Visit (HOSPITAL_BASED_OUTPATIENT_CLINIC_OR_DEPARTMENT_OTHER): Admission: RE | Admit: 2015-12-23 | Payer: Medicaid Other | Source: Ambulatory Visit | Admitting: General Surgery

## 2015-12-23 ENCOUNTER — Other Ambulatory Visit (INDEPENDENT_AMBULATORY_CARE_PROVIDER_SITE_OTHER): Payer: Self-pay | Admitting: General Surgery

## 2015-12-23 DIAGNOSIS — R921 Mammographic calcification found on diagnostic imaging of breast: Secondary | ICD-10-CM

## 2015-12-23 SURGERY — BREAST LUMPECTOMY WITH RADIOACTIVE SEED LOCALIZATION
Anesthesia: General | Site: Breast | Laterality: Left

## 2015-12-23 MED ORDER — BUPIVACAINE-EPINEPHRINE (PF) 0.25% -1:200000 IJ SOLN
INTRAMUSCULAR | Status: AC
  Filled 2015-12-23: qty 30

## 2015-12-25 ENCOUNTER — Ambulatory Visit
Admission: RE | Admit: 2015-12-25 | Discharge: 2015-12-25 | Disposition: A | Discharge status: Home / Self Care | Payer: Medicaid Other | Source: Ambulatory Visit | Attending: General Surgery | Admitting: General Surgery

## 2015-12-25 DIAGNOSIS — R921 Mammographic calcification found on diagnostic imaging of breast: Secondary | ICD-10-CM

## 2016-01-15 DIAGNOSIS — D0512 Intraductal carcinoma in situ of left breast: Secondary | ICD-10-CM

## 2016-01-15 DIAGNOSIS — N6092 Unspecified benign mammary dysplasia of left breast: Secondary | ICD-10-CM

## 2016-01-15 HISTORY — DX: Intraductal carcinoma in situ of left breast: D05.12

## 2016-01-15 HISTORY — DX: Unspecified benign mammary dysplasia of left breast: N60.92

## 2016-01-24 ENCOUNTER — Ambulatory Visit
Admission: RE | Admit: 2016-01-24 | Discharge: 2016-01-24 | Disposition: A | Discharge status: Home / Self Care | Payer: Medicaid Other | Source: Ambulatory Visit | Attending: General Surgery | Admitting: General Surgery

## 2016-01-24 ENCOUNTER — Other Ambulatory Visit: Payer: Self-pay | Admitting: General Surgery

## 2016-01-24 DIAGNOSIS — D0512 Intraductal carcinoma in situ of left breast: Secondary | ICD-10-CM

## 2016-01-24 MED ORDER — GADOBENATE DIMEGLUMINE 529 MG/ML IV SOLN
17.0000 mL | Freq: Once | INTRAVENOUS | Status: AC | PRN
  Administered 2016-01-24: 17 mL via INTRAVENOUS

## 2016-01-27 ENCOUNTER — Other Ambulatory Visit: Payer: Self-pay | Admitting: General Surgery

## 2016-01-27 DIAGNOSIS — D0512 Intraductal carcinoma in situ of left breast: Secondary | ICD-10-CM

## 2016-01-28 ENCOUNTER — Telehealth: Payer: Self-pay | Admitting: Cardiology

## 2016-01-28 NOTE — Telephone Encounter (Signed)
Dose clarification given. Pt was written for 40mg  daily by pharmacy rather than 20mg  BID. She will clarify w/ pharmacy as to whether this was an error or not. Pt voiced understanding of instructions.

## 2016-01-28 NOTE — Telephone Encounter (Signed)
New message   Pt wants to know how she is suppose to take her lisinopril 40mg 

## 2016-02-10 ENCOUNTER — Telehealth: Payer: Self-pay | Admitting: Hematology

## 2016-02-10 ENCOUNTER — Encounter (HOSPITAL_BASED_OUTPATIENT_CLINIC_OR_DEPARTMENT_OTHER): Payer: Self-pay | Admitting: *Deleted

## 2016-02-10 ENCOUNTER — Other Ambulatory Visit: Payer: Self-pay | Admitting: General Surgery

## 2016-02-10 NOTE — Telephone Encounter (Signed)
new breast appt-patient scheduled for 04/24 @ 11 w/Dr. Burr Medico Referring Dr. Excell Seltzer Referral information scan welcome packet mailed.

## 2016-02-10 NOTE — Pre-Procedure Instructions (Signed)
To come for BMET 

## 2016-02-11 ENCOUNTER — Telehealth: Payer: Self-pay | Admitting: Cardiology

## 2016-02-11 NOTE — Telephone Encounter (Signed)
New message      Pt c/o medication issue:  1. Name of Medication: carvedilol 2. How are you currently taking this medication (dosage and times per day)? 12.5 3. Are you having a reaction (difficulty breathing--STAT)? no 4. What is your medication issue? Pt want to know how and when should she take her medication.  She thinks her instructions may not have been updated by the pharmacist

## 2016-02-11 NOTE — Telephone Encounter (Signed)
Patient wanted clarification she has a bottle from [place]  1 and 1/2 tablets twice a day  RN INFORMED PATIENT THAT THE DIRECTION WAS CHANGED IN 03/2015- SHE SHOULD BE TAKING 12.5 MG TWICE A DAY  PATIENT STATES SHE HAS BEEN TAKING THAT DOSE ,BUT SHE WANTED TO BE SURE. PATIENT VERBALIZED UNDERSTANDING

## 2016-02-12 ENCOUNTER — Encounter (HOSPITAL_BASED_OUTPATIENT_CLINIC_OR_DEPARTMENT_OTHER)
Admission: RE | Admit: 2016-02-12 | Discharge: 2016-02-12 | Disposition: A | Discharge status: Home / Self Care | Payer: Medicaid Other | Source: Ambulatory Visit | Attending: General Surgery | Admitting: General Surgery

## 2016-02-12 DIAGNOSIS — Z01812 Encounter for preprocedural laboratory examination: Secondary | ICD-10-CM | POA: Insufficient documentation

## 2016-02-12 DIAGNOSIS — D0512 Intraductal carcinoma in situ of left breast: Secondary | ICD-10-CM | POA: Diagnosis present

## 2016-02-12 LAB — BASIC METABOLIC PANEL
Anion gap: 11 (ref 5–15)
BUN: 8 mg/dL (ref 6–20)
CHLORIDE: 101 mmol/L (ref 101–111)
CO2: 24 mmol/L (ref 22–32)
CREATININE: 0.82 mg/dL (ref 0.44–1.00)
Calcium: 9.2 mg/dL (ref 8.9–10.3)
GFR calc Af Amer: >60 mL/min (ref >60)
GFR calc non Af Amer: >60 mL/min (ref >60)
GLUCOSE: 385 mg/dL — AB (ref 65–99)
POTASSIUM: 4.4 mmol/L (ref 3.5–5.1)
Sodium: 136 mmol/L (ref 135–145)

## 2016-02-12 NOTE — Progress Notes (Signed)
Pt's glucose 385- notified Dr. Aris Lot- tell her to take 1/2 her dose of insulin Sunday night before her surgery Monday. Called [name], she forgot to take insulin last night and at times she only takes it every other night. Explained the importance of taking her insulin( 60 units) every night to keep her blood glucose level and to take only 30 units Sunday night before her surgery Monday. Also Dr. Aris Lot suggests she go visit her primary care doctor ( Dr. Irven Shelling) to make sure she does not need an adjustment on her dose. Pt said she would call her doctor and ask her about the insulin.

## 2016-02-14 NOTE — H&P (Signed)
History of Present Illness Kristen Snyder T. Kynsley Whitehouse MD; 01/31/2016 9:24 AM) The patient is a 50 year old female who presents with breast cancer. She returns for further treatment planning for known DCIS left breast. Patient has had routine screening mammograms for a number of years. She has no previous history of breast disease. There is no significant family history of breast cancer. Recent screening mammogram dated October 26 revealed dense breast tissue category C with new faint calcifications in the lower inner left breast.  All these results were discussed with the patient. She subsequently has had 3 core biopsies and 2 MRIs to assess her disease.  I have reviewed the MRI of March 10 and all previous mammograms and biopsies with Dr. Peggye Fothergill. To recap, she has had 3 biopsies. Initial biopsy in the far posterior inner lobar left breast with an X shaped clip revealed atypical lobular hyperplasia and other benign changes. Subsequently she had biopsy of a separate area in the central lower breast( more lateral) showing DCIS with comedonecrosis, this with a coil clip. Finally, a separate small area of calcifications was seen again very medial in the breast but at middle depth and was biopsied showing atypical ductal hyperplasia. This was also a coil clip. Repeat MRI to try to assess extent of DCIS was just performed which shows reduced enhancement with just some stippled enhancement between the 2 coil clips which are about 2.5 cm apart noncompressed and up to about 5 cm apart when the breast is compressed. also some reduced enhancement posteriorly in the area of known atypical lobular hyperplasia. Therefore, it appears that the area of DCIS and atypical ductal hyperplasia could be excised with a single bracketed specimen encompassing the 2 coiled clips and extending the excision somewhat posterior to this. The area of the ex-shaped clip biopsy marker does not have to be excised. This was all  discussed and confirmed with Dr. Purcell Nails. I discussed this with the patient who desires to pursue breast conservation. We reviewed all of the imaging and biopsy results in the office today.   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 Shoulder Surgery Right. Breast Biopsy Left.  Diagnostic Studies History Edward Jolly, MD; 01/31/2016 9:27 AM) Colonoscopy >10 years ago Mammogram within last year Pap Smear 1-5 years ago  Allergies Elbert Ewings, CMA; 01/31/2016 8:47 AM) BiDil *CARDIOVASCULAR AGENTS - MISC.*  Medication History Elbert Ewings, CMA; 01/31/2016 8:47 AM) Illusions AA Breast Prosthesis (1 (one) as needed) Active. [phone] Post-mastectomy camisole - to hold drain tubes and wear during healing.; QTY: 2; Length of use: 3Months 3 Refills L8000 Post-surgical bras - to hold breast prosthesis ; QTY:6; Length of use:3 Months 3 Refills Non-silicone breast prosthesis - for temporary use during healing or for comfort at end of day, lighter weight, cooler for hot weather.; QTY: 1 for single, 2 for bilateral ; Length of use: 6 months Up to 1 refill; Replacement [id number] Silicone Breast Prosthesis - to restore balance and symmetry after breast surgery; QTY: 1 for single, 2 for Bilateral; Length of use: 2 years No refills) Carvedilol (12.5MG  Tablet, Oral) Active. Lisinopril (20MG  Tablet, Oral) Active. Spironolactone (25MG  Tablet, Oral) Active. Lantus SoloStar (100UNIT/ML Soln Pen-inj, Subcutaneous) Active. Medications Reconciled  Social History Edward Jolly, MD; 01/31/2016 9:27 AM) No drug use Tobacco use  Never smoker. Caffeine use Carbonated beverages, Coffee, Tea. Alcohol use Occasional alcohol use.  Family History Edward Jolly, MD; 01/31/2016 9:27 AM) Alcohol Abuse Sister. Prostate Cancer Father. Seizure disorder Sister. Hypertension Father, Mother, Sister. Breast Cancer Family Members In [place] Mother. Diabetes Mellitus Mother, Sister.  Pregnancy / Birth History Edward Jolly, MD; 01/31/2016 9:27 AM) Age at menarche 64 years. Para 1 Gravida 2 Irregular periods Maternal age 93-20  Vitals Elbert Ewings CMA; 01/31/2016 8:48 AM) 01/31/2016 8:47 AM Weight: 180 lb Height: 65in Body Surface Area: 1.89 m Body Mass Index: 29.95 kg/m  Temp.: 97.73F  Pulse: 64 (Regular)  BP: 140/84 (Sitting, Left Arm, Standard) pt. forgot to take her bp medication      Physical Exam Kristen Snyder T. Dyrell Tuccillo MD; 01/31/2016 9:24 AM) The physical exam findings are as follows: Note:General: Appears well Skin: No rash or infection Lymph nodes: No cervical, supra clavicular or axillary nodes Breasts: Healing core biopsy sites left breast. No palpable masses. Lungs: Clear equal breath sounds bilaterally Cardiac: Regular rate and rhythm. No edema. Neurologic: Alert and appropriate    Assessment & Plan Kristen Snyder T. Leonia Heatherly MD; 01/31/2016 9:27 AM) DCIS (DUCTAL CARCINOMA IN SITU), LEFT (D05.12) Impression: We reviewed all the imaging findings. I would recommend continuing with her plan for lumpectomy and bracketing the coiled clips at sites of ADH and DCIS. We discussed need for radiation and significant risk of positive margins in her case that could require reexcision. Risks of bleeding and anesthetic complications also were discussed and all her questions were answered. ATYPICAL DUCTAL HYPERPLASIA OF LEFT BREAST (N60.92) Current Plans Schedule for Surgery  Wire bracketed left breast lumpectomy for DCIS and ADH  Pt Education - [facility] Breast Biopsy  HCI: discussed with patient and provided information.

## 2016-02-17 ENCOUNTER — Encounter (HOSPITAL_BASED_OUTPATIENT_CLINIC_OR_DEPARTMENT_OTHER): Payer: Self-pay | Admitting: Certified Registered"

## 2016-02-17 ENCOUNTER — Encounter (HOSPITAL_BASED_OUTPATIENT_CLINIC_OR_DEPARTMENT_OTHER)
Admission: RE | Disposition: A | Discharge status: Home / Self Care | Payer: Self-pay | Source: Ambulatory Visit | Attending: General Surgery

## 2016-02-17 ENCOUNTER — Ambulatory Visit
Admission: RE | Admit: 2016-02-17 | Discharge: 2016-02-17 | Disposition: A | Discharge status: Home / Self Care | Payer: Medicaid Other | Source: Ambulatory Visit | Attending: General Surgery | Admitting: General Surgery

## 2016-02-17 ENCOUNTER — Ambulatory Visit (HOSPITAL_BASED_OUTPATIENT_CLINIC_OR_DEPARTMENT_OTHER)
Admission: RE | Admit: 2016-02-17 | Discharge: 2016-02-17 | Disposition: A | Discharge status: Home / Self Care | Payer: Medicaid Other | Source: Ambulatory Visit | Attending: General Surgery | Admitting: General Surgery

## 2016-02-17 ENCOUNTER — Ambulatory Visit (HOSPITAL_BASED_OUTPATIENT_CLINIC_OR_DEPARTMENT_OTHER): Payer: Medicaid Other | Admitting: Anesthesiology

## 2016-02-17 DIAGNOSIS — I110 Hypertensive heart disease with heart failure: Secondary | ICD-10-CM | POA: Diagnosis not present

## 2016-02-17 DIAGNOSIS — Z7982 Long term (current) use of aspirin: Secondary | ICD-10-CM | POA: Insufficient documentation

## 2016-02-17 DIAGNOSIS — I509 Heart failure, unspecified: Secondary | ICD-10-CM | POA: Insufficient documentation

## 2016-02-17 DIAGNOSIS — C50512 Malignant neoplasm of lower-outer quadrant of left female breast: Secondary | ICD-10-CM

## 2016-02-17 DIAGNOSIS — N6092 Unspecified benign mammary dysplasia of left breast: Secondary | ICD-10-CM

## 2016-02-17 DIAGNOSIS — E7800 Pure hypercholesterolemia, unspecified: Secondary | ICD-10-CM | POA: Insufficient documentation

## 2016-02-17 DIAGNOSIS — D0512 Intraductal carcinoma in situ of left breast: Secondary | ICD-10-CM

## 2016-02-17 DIAGNOSIS — Z794 Long term (current) use of insulin: Secondary | ICD-10-CM | POA: Insufficient documentation

## 2016-02-17 DIAGNOSIS — Z79899 Other long term (current) drug therapy: Secondary | ICD-10-CM | POA: Insufficient documentation

## 2016-02-17 DIAGNOSIS — E109 Type 1 diabetes mellitus without complications: Secondary | ICD-10-CM | POA: Insufficient documentation

## 2016-02-17 HISTORY — DX: Dental restoration status: Z98.811

## 2016-02-17 HISTORY — DX: Other cardiomyopathies: I42.8

## 2016-02-17 HISTORY — PX: BREAST LUMPECTOMY WITH NEEDLE LOCALIZATION: SHX5759

## 2016-02-17 HISTORY — DX: Intraductal carcinoma in situ of left breast: D05.12

## 2016-02-17 HISTORY — DX: Type 2 diabetes mellitus without complications: E11.9

## 2016-02-17 HISTORY — DX: Unspecified benign mammary dysplasia of left breast: N60.92

## 2016-02-17 HISTORY — DX: Personal history of other diseases of the circulatory system: Z86.79

## 2016-02-17 HISTORY — DX: Long term (current) use of insulin: Z79.4

## 2016-02-17 HISTORY — PX: EXCISION / BIOPSY BREAST / NIPPLE / DUCT: SUR469

## 2016-02-17 HISTORY — DX: Reserved for inherently not codable concepts without codable children: IMO0001

## 2016-02-17 LAB — GLUCOSE, CAPILLARY
GLUCOSE-CAPILLARY: 97 mg/dL (ref 65–99)
Glucose-Capillary: 102 mg/dL — ABNORMAL HIGH (ref 65–99)

## 2016-02-17 SURGERY — BREAST LUMPECTOMY WITH NEEDLE LOCALIZATION
Anesthesia: General | Site: Breast | Laterality: Left

## 2016-02-17 MED ORDER — MIDAZOLAM HCL 2 MG/2ML IJ SOLN
INTRAMUSCULAR | Status: AC
  Filled 2016-02-17: qty 2

## 2016-02-17 MED ORDER — MEPERIDINE HCL 25 MG/ML IJ SOLN: 6.2500 mg | INTRAMUSCULAR | Status: DC | PRN

## 2016-02-17 MED ORDER — CHLORHEXIDINE GLUCONATE 4 % EX LIQD: 1.0000 "application " | Freq: Once | CUTANEOUS | Status: DC

## 2016-02-17 MED ORDER — DEXTROSE 5 % IV SOLN: 2.0000 g | INTRAVENOUS | Status: DC

## 2016-02-17 MED ORDER — FENTANYL CITRATE (PF) 100 MCG/2ML IJ SOLN
50.0000 ug | INTRAMUSCULAR | Status: DC | PRN
  Administered 2016-02-17: 100 ug via INTRAVENOUS

## 2016-02-17 MED ORDER — BUPIVACAINE-EPINEPHRINE 0.5% -1:200000 IJ SOLN
INTRAMUSCULAR | Status: DC | PRN
  Administered 2016-02-17: 30 mL

## 2016-02-17 MED ORDER — GLYCOPYRROLATE 0.2 MG/ML IJ SOLN: 0.2000 mg | Freq: Once | INTRAMUSCULAR | Status: DC | PRN

## 2016-02-17 MED ORDER — CEFAZOLIN SODIUM-DEXTROSE 2-4 GM/100ML-% IV SOLN
INTRAVENOUS | Status: AC
  Filled 2016-02-17: qty 100

## 2016-02-17 MED ORDER — PHENYLEPHRINE HCL 10 MG/ML IJ SOLN
INTRAMUSCULAR | Status: DC | PRN
  Administered 2016-02-17 (×3): 80 ug via INTRAVENOUS

## 2016-02-17 MED ORDER — SCOPOLAMINE 1 MG/3DAYS TD PT72: 1.0000 | MEDICATED_PATCH | Freq: Once | TRANSDERMAL | Status: DC | PRN

## 2016-02-17 MED ORDER — LIDOCAINE HCL (CARDIAC) 20 MG/ML IV SOLN
INTRAVENOUS | Status: DC | PRN
  Administered 2016-02-17: 60 mg via INTRAVENOUS

## 2016-02-17 MED ORDER — DEXAMETHASONE SODIUM PHOSPHATE 4 MG/ML IJ SOLN
INTRAMUSCULAR | Status: DC | PRN
  Administered 2016-02-17: 10 mg via INTRAVENOUS

## 2016-02-17 MED ORDER — ONDANSETRON HCL 4 MG/2ML IJ SOLN
INTRAMUSCULAR | Status: DC | PRN
  Administered 2016-02-17: 4 mg via INTRAVENOUS

## 2016-02-17 MED ORDER — PROPOFOL 10 MG/ML IV BOLUS
INTRAVENOUS | Status: DC | PRN
  Administered 2016-02-17: 150 mg via INTRAVENOUS

## 2016-02-17 MED ORDER — PHENYLEPHRINE 40 MCG/ML (10ML) SYRINGE FOR IV PUSH (FOR BLOOD PRESSURE SUPPORT)
PREFILLED_SYRINGE | INTRAVENOUS | Status: AC
  Filled 2016-02-17: qty 10

## 2016-02-17 MED ORDER — HYDROCODONE-ACETAMINOPHEN 5-325 MG PO TABS: 1.0000 | ORAL_TABLET | ORAL | Status: DC | PRN

## 2016-02-17 MED ORDER — LACTATED RINGERS IV SOLN: INTRAVENOUS | Status: DC

## 2016-02-17 MED ORDER — DEXTROSE 5 % IV SOLN
2.0000 g | INTRAVENOUS | Status: AC
  Administered 2016-02-17: 2 g via INTRAVENOUS

## 2016-02-17 MED ORDER — FENTANYL CITRATE (PF) 100 MCG/2ML IJ SOLN
INTRAMUSCULAR | Status: AC
  Filled 2016-02-17: qty 2

## 2016-02-17 MED ORDER — HYDROMORPHONE HCL 1 MG/ML IJ SOLN: 0.2500 mg | INTRAMUSCULAR | Status: DC | PRN

## 2016-02-17 MED ORDER — PROMETHAZINE HCL 25 MG/ML IJ SOLN: 6.2500 mg | INTRAMUSCULAR | Status: DC | PRN

## 2016-02-17 MED ORDER — MIDAZOLAM HCL 2 MG/2ML IJ SOLN
1.0000 mg | INTRAMUSCULAR | Status: DC | PRN
  Administered 2016-02-17: 2 mg via INTRAVENOUS

## 2016-02-17 MED ORDER — LACTATED RINGERS IV SOLN
INTRAVENOUS | Status: DC
  Administered 2016-02-17: 10:00:00 via INTRAVENOUS

## 2016-02-17 SURGICAL SUPPLY — 44 items
APPLIER CLIP 9.375 MED OPEN (MISCELLANEOUS)
APR CLP MED 9.3 20 MLT OPN (MISCELLANEOUS)
BINDER BREAST XLRG (GAUZE/BANDAGES/DRESSINGS) ×1 IMPLANT
BLADE SURG 15 STRL LF DISP TIS (BLADE) ×1 IMPLANT
BLADE SURG 15 STRL SS (BLADE) ×2
CANISTER SUCT 1200ML W/VALVE (MISCELLANEOUS) ×1 IMPLANT
CHLORAPREP W/TINT 26ML (MISCELLANEOUS) ×2 IMPLANT
CLIP APPLIE 9.375 MED OPEN (MISCELLANEOUS) IMPLANT
CLIP TI WIDE RED SMALL 6 (CLIP) IMPLANT
COVER BACK TABLE 60X90IN (DRAPES) ×2 IMPLANT
COVER MAYO STAND STRL (DRAPES) ×2 IMPLANT
DEVICE DUBIN W/COMP PLATE 8390 (MISCELLANEOUS) ×2 IMPLANT
DRAPE LAPAROTOMY 100X72 PEDS (DRAPES) ×2 IMPLANT
DRAPE UTILITY XL STRL (DRAPES) ×2 IMPLANT
ELECT COATED BLADE 2.86 ST (ELECTRODE) ×2 IMPLANT
ELECT REM PT RETURN 9FT ADLT (ELECTROSURGICAL) ×2
ELECTRODE REM PT RTRN 9FT ADLT (ELECTROSURGICAL) ×1 IMPLANT
GLOVE BIOGEL PI IND STRL 7.0 (GLOVE) IMPLANT
GLOVE BIOGEL PI IND STRL 8 (GLOVE) ×1 IMPLANT
GLOVE BIOGEL PI INDICATOR 7.0 (GLOVE) ×1
GLOVE BIOGEL PI INDICATOR 8 (GLOVE) ×1
GLOVE ECLIPSE 6.5 STRL STRAW (GLOVE) ×1 IMPLANT
GLOVE ECLIPSE 7.5 STRL STRAW (GLOVE) ×2 IMPLANT
GOWN STRL REUS W/ TWL LRG LVL3 (GOWN DISPOSABLE) ×1 IMPLANT
GOWN STRL REUS W/ TWL XL LVL3 (GOWN DISPOSABLE) ×1 IMPLANT
GOWN STRL REUS W/TWL LRG LVL3 (GOWN DISPOSABLE) ×2
GOWN STRL REUS W/TWL XL LVL3 (GOWN DISPOSABLE) ×2
ILLUMINATOR WAVEGUIDE N/F (MISCELLANEOUS) ×1 IMPLANT
KIT MARKER MARGIN INK (KITS) ×2 IMPLANT
LIQUID BAND (GAUZE/BANDAGES/DRESSINGS) ×2 IMPLANT
NDL HYPO 25X1 1.5 SAFETY (NEEDLE) ×1 IMPLANT
NEEDLE HYPO 25X1 1.5 SAFETY (NEEDLE) ×2 IMPLANT
NS IRRIG 1000ML POUR BTL (IV SOLUTION) ×2 IMPLANT
PACK BASIN DAY SURGERY FS (CUSTOM PROCEDURE TRAY) ×2 IMPLANT
PENCIL BUTTON HOLSTER BLD 10FT (ELECTRODE) ×2 IMPLANT
SLEEVE SCD COMPRESS KNEE MED (MISCELLANEOUS) ×2 IMPLANT
SUT MON AB 5-0 PS2 18 (SUTURE) ×2 IMPLANT
SUT VICRYL 3-0 CR8 SH (SUTURE) ×2 IMPLANT
SYR BULB 3OZ (MISCELLANEOUS) ×1 IMPLANT
SYR CONTROL 10ML LL (SYRINGE) ×2 IMPLANT
TOWEL OR 17X24 6PK STRL BLUE (TOWEL DISPOSABLE) ×2 IMPLANT
TOWEL OR NON WOVEN STRL DISP B (DISPOSABLE) ×2 IMPLANT
TUBE CONNECTING 20X1/4 (TUBING) ×1 IMPLANT
YANKAUER SUCT BULB TIP NO VENT (SUCTIONS) ×2 IMPLANT

## 2016-02-17 NOTE — Op Note (Signed)
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 Initial biopsy of deep lower inner quadrant showed fibrocystic change and foci of atypical lobular hyperplasia. However due to other calcifications and abnormalities on MRI detailed elsewhere she has had 2 additional biopsies, one in the lateral lower left breast showing high-grade DCIS and another area about 4 cm medial to this showing atypical ductal hyperplasia. After extensive evaluation and discussion we have elected to proceed with wire bracketed excision of the contiguous area of DCIS and atypical ductal hyperplasia. Review her mammogram shows the area of atypical lobular hyperplasia has had essentially all the calcifications removed and as this is remote  And would be cosmetically difficult to excise a third area and as she is going to receive radiation and hormonal therapy I have not recommended excision of the previous biopsied lobular hyperplasia. This is all been discussed with the patient and understood and she agrees.    Procedure Detail:  Following accurate placement of bracketing wires as above the patient is seen in the holding area and Taken to the operating room and laryngeal mask general anesthesia induced.  the left breast was widely sterilely prepped and draped.  Patient timeout was performed and correct procedure verified. I used a curvilinear incision in the skin crease in the lower breast at the lateral wire site and extending somewhat over towards the more medial wire. Dissection was carried down to the subcutaneous tissue and the lateral wire brought into the incision. Dissection was then deepened down  around the shaft and tip of the lateral wire in this area and the specimen was brought up out of the breast. The dissection was then continued medially contiguously with the specimen over to the medial wire shaft which was brought into the incision and somewhat smaller area of tissue excised around the medial wire. The specimen was inked for margins. Specimen mammogram showed both marking clips within the specimen. Examination of the specimen and x-ray indicated somewhat closer margin on the lateral wire of the superior and lateral edge and these 2 margins were further excised back about 5 mm and inked. Soft tissue was infiltrated with Marcaine.The wound was irrigated and complete hemostasis assured. The lumpectomy cavity was marked with clips. The deep breast and subcutaneous tissue was closed with interrupted 3-0 Vicryl and the skin closed with running subcuticular 4-0 Monocryl and Dermabond. Sponge needle and instrument counts were correct.    Findings: As above  Estimated Blood Loss:  Minimal         Drains: none  Blood Given: none          Specimens: Left breast lumpectomy        Complications:  * No complications entered in OR log *         Disposition: PACU - hemodynamically stable.         Condition: stable

## 2016-02-17 NOTE — Anesthesia Postprocedure Evaluation (Signed)
Anesthesia Post Note  Patient: Kristen Snyder  Procedure(s) Performed: Procedure(s) (LRB): LEFT BREAST LUMPECTOMY WITH BRACKETED NEEDLE LOCALIZATION (Left)  Patient location during evaluation: PACU Anesthesia Type: General Level of consciousness: awake and alert Pain management: pain level controlled Vital Signs Assessment: post-procedure vital signs reviewed and stable Respiratory status: spontaneous breathing, nonlabored ventilation, respiratory function stable and patient connected to nasal cannula oxygen Cardiovascular status: blood pressure returned to baseline and stable Postop Assessment: no signs of nausea or vomiting Anesthetic complications: no    Last Vitals:  Filed Vitals:   02/17/16 1145 02/17/16 1200  BP: 136/99 136/96  Pulse: 70 70  Temp:    Resp: 15 12    Last Pain: There were no vitals filed for this visit.               Effie Berkshire

## 2016-02-17 NOTE — Anesthesia Procedure Notes (Signed)
Procedure Name: LMA Insertion Date/Time: 02/17/2016 10:07 AM Performed by: Lyndee Leo Pre-anesthesia Checklist: Patient identified, Emergency Drugs available, Suction available and Patient being monitored Patient Re-evaluated:Patient Re-evaluated prior to inductionOxygen Delivery Method: Circle System Utilized Preoxygenation: Pre-oxygenation with 100% oxygen Intubation Type: IV induction Ventilation: Mask ventilation without difficulty LMA: LMA inserted LMA Size: 4.0 Number of attempts: 1 Airway Equipment and Method: Bite block Placement Confirmation: positive ETCO2 Tube secured with: Tape Dental Injury: Teeth and Oropharynx as per pre-operative assessment

## 2016-02-17 NOTE — Anesthesia Preprocedure Evaluation (Addendum)
Anesthesia Evaluation  Patient identified by MRN, date of birth, ID band Patient awake    Reviewed: Allergy & Precautions, NPO status , Patient's Chart, lab work & pertinent test results, reviewed documented beta blocker date and time   Airway Mallampati: II  TM Distance: >3 FB Neck ROM: Full    Dental  (+) Teeth Intact   Pulmonary neg pulmonary ROS,    breath sounds clear to auscultation       Cardiovascular hypertension, Pt. on medications and Pt. on home beta blockers  Rhythm:Regular Rate:Normal  CHF diagnosis was later determined to viral pneumonia. No recent cardiac issues.    Neuro/Psych Anxiety negative neurological ROS     GI/Hepatic negative GI ROS, Neg liver ROS,   Endo/Other  diabetes, Type 1, Insulin Dependent  Renal/GU negative Renal ROS  negative genitourinary   Musculoskeletal negative musculoskeletal ROS (+)   Abdominal Normal abdominal exam  (+)   Peds negative pediatric ROS (+)  Hematology   Anesthesia Other Findings   Reproductive/Obstetrics negative OB ROS                            Lab Results  Component Value Date   WBC 6.5 10/19/2014   HGB 10.8* 10/19/2014   HCT 35.1* 10/19/2014   MCV 62.3* 10/19/2014   PLT 403* 10/19/2014   Lab Results  Component Value Date   CREATININE 0.82 02/12/2016   BUN 8 02/12/2016   NA 136 02/12/2016   K 4.4 02/12/2016   CL 101 02/12/2016   CO2 24 02/12/2016   Lab Results  Component Value Date   INR 1.18 10/08/2014   09/2015 EKG: normal sinus rhythm.  04/2015 Echo - Left ventricle: Difficult apical windows. LVEF is approximately 50 to 55% with basal inferior hypokinesis. The cavity size was normal. Systolic function was normal. The estimated ejection fraction was in the range of 50% to 55%. Doppler parameters are consistent with abnormal left ventricular relaxation (grade 1 diastolic dysfunction).  Anesthesia  Physical Anesthesia Plan  ASA: III  Anesthesia Plan: General   Post-op Pain Management:    Induction: Intravenous  Airway Management Planned: LMA  Additional Equipment:   Intra-op Plan:   Post-operative Plan: Extubation in OR  Informed Consent: I have reviewed the patients History and Physical, chart, labs and discussed the procedure including the risks, benefits and alternatives for the proposed anesthesia with the patient or authorized representative who has indicated his/her understanding and acceptance.   Dental advisory given  Plan Discussed with: CRNA  Anesthesia Plan Comments:         Anesthesia Quick Evaluation

## 2016-02-17 NOTE — Interval H&P Note (Signed)
History and Physical Interval Note:  02/17/2016 9:42 AM  Kristen Snyder  has presented today for surgery, with the diagnosis of LEFT BREAST DCIS AND ADH  The various methods of treatment have been discussed with the patient and family. After consideration of risks, benefits and other options for treatment, the patient has consented to  Procedure(s): LEFT BREAST LUMPECTOMY WITH BRACKETED NEEDLE LOCALIZATION (Left) as a surgical intervention .  The patient's history has been reviewed, patient examined, no change in status, stable for surgery.  I have reviewed the patient's chart and labs.  Questions were answered to the patient's satisfaction.     [name]

## 2016-02-17 NOTE — Transfer of Care (Signed)
Immediate Anesthesia Transfer of Care Note  Patient: Kristen Snyder  Procedure(s) Performed: Procedure(s): LEFT BREAST LUMPECTOMY WITH BRACKETED NEEDLE LOCALIZATION (Left)  Patient Location: PACU  Anesthesia Type:General  Level of Consciousness: awake and patient cooperative  Airway & Oxygen Therapy: Patient Spontanous Breathing and Patient connected to face mask oxygen  Post-op Assessment: Report given to RN and Post -op Vital signs reviewed and stable  Post vital signs: Reviewed and stable  Last Vitals:  Filed Vitals:   02/17/16 0920  BP: 135/88  Pulse: 73  Temp: 36.4 C  Resp: 16    Complications: No apparent anesthesia complications

## 2016-02-17 NOTE — Discharge Instructions (Signed)
[facility] °Office Phone Number [phone] ° °BREAST BIOPSY/ PARTIAL MASTECTOMY: POST OP INSTRUCTIONS ° °Always review your discharge instruction sheet given to you by the facility where your surgery was performed. ° °IF YOU HAVE DISABILITY OR FAMILY LEAVE FORMS, YOU MUST BRING THEM TO THE OFFICE FOR PROCESSING.  DO NOT GIVE THEM TO YOUR DOCTOR. ° °1. A prescription for pain medication may be given to you upon discharge.  Take your pain medication as prescribed, if needed.  If narcotic pain medicine is not needed, then you may take acetaminophen (Tylenol) or ibuprofen (Advil) as needed. °2. Take your usually prescribed medications unless otherwise directed °3. If you need a refill on your pain medication, please contact your pharmacy.  They will contact our office to request authorization.  Prescriptions will not be filled after 5pm or on week-ends. °4. You should eat very light the first 24 hours after surgery, such as soup, crackers, pudding, etc.  Resume your normal diet the day after surgery. °5. Most patients will experience some swelling and bruising in the breast.  Ice packs and a good support bra will help.  Swelling and bruising can take several days to resolve.  °6. It is common to experience some constipation if taking pain medication after surgery.  Increasing fluid intake and taking a stool softener will usually help or prevent this problem from occurring.  A mild laxative (Milk of Magnesia or Miralax) should be taken according to package directions if there are no bowel movements after 48 hours. °7. Unless discharge instructions indicate otherwise, you may remove your bandages 24-48 hours after surgery, and you may shower at that time.  You may have steri-strips (small skin tapes) in place directly over the incision.  These strips should be left on the skin for 7-10 days.  If your surgeon used skin glue on the incision, you may shower in 24 hours.  The glue will flake off over the  next 2-3 weeks.  Any sutures or staples will be removed at the office during your follow-up visit. °8. ACTIVITIES:  You may resume regular daily activities (gradually increasing) beginning the next day.  Wearing a good support bra or sports bra minimizes pain and swelling.  You may have sexual intercourse when it is comfortable. °a. You may drive when you no longer are taking prescription pain medication, you can comfortably wear a seatbelt, and you can safely maneuver your car and apply brakes. °b. RETURN TO WORK:  ______________________________________________________________________________________ °9. You should see your doctor in the office for a follow-up appointment approximately two weeks after your surgery.  Your doctor’s nurse will typically make your follow-up appointment when she calls you with your pathology report.  Expect your pathology report 2-3 business days after your surgery.  You may call to check if you do not hear from us after three days. °10. OTHER INSTRUCTIONS: _______________________________________________________________________________________________ _____________________________________________________________________________________________________________________________________ °_____________________________________________________________________________________________________________________________________ °_____________________________________________________________________________________________________________________________________ ° °WHEN TO CALL YOUR DOCTOR: °1. Fever over 101.0 °2. Nausea and/or vomiting. °3. Extreme swelling or bruising. °4. Continued bleeding from incision. °5. Increased pain, redness, or drainage from the incision. ° °The clinic staff is available to answer your questions during regular business hours.  Please don’t hesitate to call and ask to speak to one of the nurses for clinical concerns.  If you have a medical emergency, go to the nearest  emergency room or call 911.  A surgeon from [facility] is always on call at the hospital. ° °For further questions, please visit [email or web address]  ° ° ° °  Post Anesthesia Home Care Instructions ° °Activity: °Get plenty of rest for the remainder of the day. A responsible adult should stay with you for 24 hours following the procedure.  °For the next 24 hours, DO NOT: °-Drive a car °-Operate machinery °-Drink alcoholic beverages °-Take any medication unless instructed by your physician °-Make any legal decisions or sign important papers. ° °Meals: °Start with liquid foods such as gelatin or soup. Progress to regular foods as tolerated. Avoid greasy, spicy, heavy foods. If nausea and/or vomiting occur, drink only clear liquids until the nausea and/or vomiting subsides. Call your physician if vomiting continues. ° °Special Instructions/Symptoms: °Your throat may feel dry or sore from the anesthesia or the breathing tube placed in your throat during surgery. If this causes discomfort, gargle with warm salt water. The discomfort should disappear within 24 hours. ° °If you had a scopolamine patch placed behind your ear for the management of post- operative nausea and/or vomiting: ° °1. The medication in the patch is effective for 72 hours, after which it should be removed.  Wrap patch in a tissue and discard in the trash. Wash hands thoroughly with soap and water. °2. You may remove the patch earlier than 72 hours if you experience unpleasant side effects which may include dry mouth, dizziness or visual disturbances. °3. Avoid touching the patch. Wash your hands with soap and water after contact with the patch. °  ° °

## 2016-02-18 ENCOUNTER — Encounter (HOSPITAL_BASED_OUTPATIENT_CLINIC_OR_DEPARTMENT_OTHER): Payer: Self-pay | Admitting: General Surgery

## 2016-03-08 NOTE — Progress Notes (Signed)
No show today   This encounter was created in error - please disregard. 

## 2016-03-09 ENCOUNTER — Ambulatory Visit
Admission: RE | Admit: 2016-03-09 | Discharge: 2016-03-09 | Disposition: A | Discharge status: Home / Self Care | Payer: Medicaid Other | Source: Ambulatory Visit | Attending: Radiation Oncology | Admitting: Radiation Oncology

## 2016-03-09 ENCOUNTER — Telehealth: Payer: Self-pay | Admitting: *Deleted

## 2016-03-09 ENCOUNTER — Ambulatory Visit: Admission: RE | Admit: 2016-03-09 | Payer: Medicaid Other | Source: Ambulatory Visit

## 2016-03-09 ENCOUNTER — Inpatient Hospital Stay: Admission: RE | Admit: 2016-03-09 | Payer: Medicaid Other | Source: Ambulatory Visit | Admitting: Radiation Oncology

## 2016-03-09 ENCOUNTER — Encounter: Payer: Medicaid Other | Admitting: Hematology

## 2016-03-09 ENCOUNTER — Ambulatory Visit: Payer: Medicaid Other | Admitting: Radiation Oncology

## 2016-03-09 DIAGNOSIS — E119 Type 2 diabetes mellitus without complications: Secondary | ICD-10-CM | POA: Insufficient documentation

## 2016-03-09 DIAGNOSIS — C50512 Malignant neoplasm of lower-outer quadrant of left female breast: Secondary | ICD-10-CM

## 2016-03-09 DIAGNOSIS — I110 Hypertensive heart disease with heart failure: Secondary | ICD-10-CM | POA: Diagnosis not present

## 2016-03-09 DIAGNOSIS — Z170 Estrogen receptor positive status [ER+]: Secondary | ICD-10-CM | POA: Diagnosis not present

## 2016-03-09 DIAGNOSIS — Z510 Encounter for antineoplastic radiation therapy: Secondary | ICD-10-CM | POA: Diagnosis present

## 2016-03-09 DIAGNOSIS — Z794 Long term (current) use of insulin: Secondary | ICD-10-CM | POA: Insufficient documentation

## 2016-03-09 DIAGNOSIS — I509 Heart failure, unspecified: Secondary | ICD-10-CM | POA: Diagnosis not present

## 2016-03-09 DIAGNOSIS — F419 Anxiety disorder, unspecified: Secondary | ICD-10-CM | POA: Insufficient documentation

## 2016-03-09 NOTE — Telephone Encounter (Signed)
Called and left message for Mr Kristen Snyder in regards to her appt. for 9:30am today and also checked with the staff in the lobby of [facility] with no response when name called.

## 2016-03-10 ENCOUNTER — Ambulatory Visit
Admission: RE | Admit: 2016-03-10 | Discharge: 2016-03-10 | Disposition: A | Discharge status: Home / Self Care | Payer: Medicaid Other | Source: Ambulatory Visit | Attending: Radiation Oncology | Admitting: Radiation Oncology

## 2016-03-10 ENCOUNTER — Encounter: Payer: Self-pay | Admitting: Radiation Oncology

## 2016-03-10 VITALS — BP 137/92 | HR 90 | Resp 16 | Ht 64.0 in | Wt 195.0 lb

## 2016-03-10 DIAGNOSIS — C50512 Malignant neoplasm of lower-outer quadrant of left female breast: Secondary | ICD-10-CM

## 2016-03-10 DIAGNOSIS — Z510 Encounter for antineoplastic radiation therapy: Secondary | ICD-10-CM | POA: Diagnosis not present

## 2016-03-10 NOTE — Progress Notes (Signed)
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  Narrative:  Kristen Snyder is a 50 y.o. female with a history of left breast cancer. The patient underwent a screening mammogram in October 2016 revealing calcifications within the left breast. Further diagnostic images revealed features concerning for malignancy.   She underwent a left breast biopsy on 10/14/2015 which revealed lobular neoplasia (atypical lobular hyperplasia). 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   She met with Dr. Excell Seltzer and underwent a bilateral breast MRI on 11/13/2015 revealing abnormal though non-mass enhancement in the left lower breast from the central aspect of outer quadrant middle third depth measuring 4.3 cm from medial to lateral by 1.6 cm anterior to posterior by 1.4 cm superior to inferior. A biopsy clip was eating at the lateral margin of this. Along the far posterior aspect of the lower inner quadrant there was a 2.6 x 1 x 1 cm area of enhancement again non-masslike. Biopsy clip is not convincingly seen. No abnormal appearing lymph nodes were identified. She has undergone left lumpectomy on 02/17/2016, and comes today for further discussion of beginning radiotherapy. Her pathology confirmed ductal carcinoma in situ, no evidence of involved margins are present, and lobular neoplasia was noted in the superior excision, and on the lateral margin benign breast parenchyma was present.  On review of systems, the patient reports she is doing very well overall. She denies any chest pain, shortness of breath, fevers or chills. She denies any soreness or edema her breast on the left.  She is not experiencing abdominal pain, nausea, vomiting, bowel or bladder dysfunction. She states that she does have hot flashes and her last menstrual cycle was in the spring time last year. She believes that she is menopausal at this time. A complete review of systems is obtained and is otherwise negative.                      Past Medical History:  Past Medical History  Diagnosis Date  . Anxiety   . Hypertension     states under control with meds., has been on med. at least 63 yr.  . History of congestive heart failure 10/2014  . Nonischemic cardiomyopathy ([place])   . Insulin dependent diabetes mellitus ([place])   . 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 Dental crown present    Past Surgical History:  Past Surgical History  Procedure Laterality Date  . Left and right heart catheterization with coronary angiogram [name] 10/08/2014    Procedure: LEFT AND RIGHT HEART CATHETERIZATION WITH CORONARY ANGIOGRAM;  Surgeon: Jettie Booze, MD;  Location: [facility] CATH LAB;  Service: Cardiovascular;  Laterality: [name];  . Knee arthroscopy with anterior cruciate ligament (acl) repair Right   . Shoulder arthroscopy w/ rotator cuff repair Right   . Breast lumpectomy with needle localization Left 02/17/2016    Procedure: LEFT BREAST LUMPECTOMY WITH BRACKETED NEEDLE LOCALIZATION;  Surgeon: Excell Seltzer, MD;  Location: [place];  Service: General;  Laterality: Left;    Social History:  Social History   Social History  .  Marital Status: Married    Spouse Name: [name]  . Number of Children: 1  . Years of Education: [name]   Occupational History  .      [employer]   Social History Main Topics  . Smoking status: Never Smoker   . Smokeless tobacco: Never Used  . Alcohol Use: 0.0 oz/week    0 Standard drinks or equivalent per week     Comment: occasionally  . Drug Use: No  . Sexual Activity: Yes   Other Topics  Concern  . Not on file   Social History Narrative    Family History:  Family History  Problem Relation Age of Onset  . Heart disease      No family history  . Diabetes Mother   . Hypertension Mother   . Cancer Mother     colon  . Hypertension Father   . Diabetes Father   . Cancer Father     prostate    ALLERGIES:  is allergic to bidil.  Meds: Current Outpatient Prescriptions  Medication Sig Dispense Refill  . ACCU-CHEK AVIVA PLUS test strip   0  . aspirin EC 81 MG tablet Take 1 tablet (81 mg total) by mouth daily. 30 tablet 2  . carvedilol (COREG) 12.5 MG tablet Take 1 tablet (12.5 mg total) by mouth 2 (two) times daily with a meal. 270 tablet 3  . insulin glargine (LANTUS) 100 UNIT/ML injection Inject 80 Units into the skin at bedtime.     Marland Kitchen LANTUS SOLOSTAR 100 UNIT/ML Solostar Pen   0  . LEVEMIR FLEXTOUCH 100 UNIT/ML Pen   0  . lisinopril (PRINIVIL,ZESTRIL) 20 MG tablet Take 20 mg by mouth 2 (two) times daily.  0  . spironolactone (ALDACTONE) 25 MG tablet Take 1 tablet (25 mg total) by mouth daily. 90 tablet 3  . hydrochlorothiazide (HYDRODIURIL) 25 MG tablet Take 25 mg by mouth daily. Reported on 03/10/2016  0   No current facility-administered medications for this encounter.    Physical Findings:  height is '5\' 4"'  (1.626 m) and weight is 195 lb (88.451 kg). Her blood pressure is 137/92 and her pulse is 90. Her respiration is 16 and oxygen saturation is 100%. .   In general this is a well appearing African-American female in no acute distress. She's alert and oriented x4 and appropriate throughout the examination. Cardiopulmonary assessment is negative for acute distress and she exhibits normal effort. The left colectomy scars evaluated and intact with Dermabond. No evidence of aortic streaking bleeding or drainage is noted. No edema of the left breast is noted.    Lab Findings: Lab Results  Component Value Date   WBC 6.5 10/19/2014   HGB 10.8* 10/19/2014   HCT  35.1* 10/19/2014   MCV 62.3* 10/19/2014   PLT 403* 10/19/2014     Radiographic Findings: Mm Breast Surgical Specimen  02/17/2016  CLINICAL DATA:  Surgical excision was performed of the left breast, following bracketed wire localization of two recently biopsied areas in the inferior left breast. EXAM: SPECIMEN RADIOGRAPH OF THE LEFT BREAST COMPARISON:  Previous exam(s). FINDINGS: Status post excision of the left breast. Two wire tips and 2 coil shaped biopsy marker clips are present and are marked for pathology. One of the coil shaped biopsy clips is very close to the edge of the surgical specimen. The surgical team is aware of this and plans to remove more tissue. IMPRESSION: Specimen radiograph of the left breast. Electronically Signed   By: Manuela Schwartz  Turner M.D.   On: 02/17/2016 10:55   Mm Lt Plc Breast Loc Dev   1st Lesion  Inc Mammo Guide  02/17/2016  CLINICAL DATA:  The patient presents for two wire localizations of the left breast. She has a diagnosis of DCIS in the location of a laterally placed coil biopsy marker (LOQ) and a diagnosis of atypical ductal hyperplasia at the site of the medially placed coil shaped biopsy marker (LIQ). EXAM: NEEDLE LOCALIZATION OF THE LEFT BREAST WITH MAMMO GUIDANCE X TWO SITES COMPARISON:  Previous exams. FINDINGS: Patient presents for needle localization prior to lumpectomy. I met with the patient and we discussed the procedure of needle localization including benefits and alternatives. We discussed the high likelihood of a successful procedure. We discussed the risks of the procedure, including infection, bleeding, tissue injury, and further surgery. Informed, written consent was given. The usual time-out protocol was performed immediately prior to the procedure. Using mammographic guidance, sterile technique, 1% lidocaine and a 7 cm modified Kopans needle, a coil shaped biopsy clip in the lower outer quadrant of the left breast was localized using an inferior to  superior approach. Using mammographic guidance, sterile technique, 1% lidocaine and a 7 cm modified Kopan's needle, a coil shaped biopsy clip in the lower inner quadrant of the left breast was localized using an inferior to superior approach. The images were marked for Dr. Excell Seltzer. IMPRESSION: Needle localization left breast at two sites. No apparent complications. Electronically Signed   By: Curlene Dolphin M.D.   On: 02/17/2016 09:46   Mm Lt Plc Breast Loc Dev   Ea Add Lesion  Inc Mammo Guide  02/17/2016  CLINICAL DATA:  The patient presents for two wire localizations of the left breast. She has a diagnosis of DCIS in the location of a laterally placed coil biopsy marker (LOQ) and a diagnosis of atypical ductal hyperplasia at the site of the medially placed coil shaped biopsy marker (LIQ). EXAM: NEEDLE LOCALIZATION OF THE LEFT BREAST WITH MAMMO GUIDANCE X TWO SITES COMPARISON:  Previous exams. FINDINGS: Patient presents for needle localization prior to lumpectomy. I met with the patient and we discussed the procedure of needle localization including benefits and alternatives. We discussed the high likelihood of a successful procedure. We discussed the risks of the procedure, including infection, bleeding, tissue injury, and further surgery. Informed, written consent was given. The usual time-out protocol was performed immediately prior to the procedure. Using mammographic guidance, sterile technique, 1% lidocaine and a 7 cm modified Kopans needle, a coil shaped biopsy clip in the lower outer quadrant of the left breast was localized using an inferior to superior approach. Using mammographic guidance, sterile technique, 1% lidocaine and a 7 cm modified Kopan's needle, a coil shaped biopsy clip in the lower inner quadrant of the left breast was localized using an inferior to superior approach. The images were marked for Dr. Excell Seltzer. IMPRESSION: Needle localization left breast at two sites. No apparent  complications. Electronically Signed   By: Curlene Dolphin M.D.   On: 02/17/2016 09:46    Impression/Plan: 1. ER positive DCIS of the left breast. Dr. Lisbeth Renshaw discussed the rationale for proceeding with radiotherapy to the left breast, he discusses the risks, benefits and short as well as long-term effects of radiotherapy. The patient is interested in moving forward and we will set her up for simulation on Friday of this week. Again we were reviewed the rationale and role for 6-1/2 weeks of radiotherapy. She states agreement and understanding. Written  consent was obtained. We also discussed that following her treatment, we will set her up for a visit with survivorship clinic and with medical oncology to discuss the role of antiestrogen therapy. She states agreement and understanding of this.  The above documentation reflects my direct findings during this shared patient visit. Please see the separate note by Dr. Lisbeth Renshaw on this date for the remainder of the patient's plan of care.    Carola Rhine, PAC

## 2016-03-10 NOTE — Progress Notes (Signed)
See progress note under physician encounter. 

## 2016-03-11 ENCOUNTER — Telehealth: Payer: Self-pay | Admitting: *Deleted

## 2016-03-11 NOTE — Telephone Encounter (Addendum)
Kristen Snyder called today with questions regarding how top obtain paperwork for, potentially, short Term Disability or FMLA.  Explained that she needs to obtain the forms from her employer([employer]) from either her branch office or her corporate office.  Stated that once she receives the forms to please bring them to Radiation Oncology and they will be completed as soon as possible once received.  Simulation on 03/13/16

## 2016-03-12 ENCOUNTER — Telehealth: Payer: Self-pay | Admitting: *Deleted

## 2016-03-12 NOTE — Telephone Encounter (Signed)
Called pt to discuss appts for 03/13/16. Reminded of navigation resources. Denies further needs at this time. Encourage pt to call with questions or concerns. Contact information provided.

## 2016-03-13 ENCOUNTER — Ambulatory Visit
Admission: RE | Admit: 2016-03-13 | Discharge: 2016-03-13 | Disposition: A | Discharge status: Home / Self Care | Payer: Medicaid Other | Source: Ambulatory Visit | Attending: Radiation Oncology | Admitting: Radiation Oncology

## 2016-03-13 ENCOUNTER — Telehealth: Payer: Self-pay | Admitting: Hematology and Oncology

## 2016-03-13 ENCOUNTER — Other Ambulatory Visit: Payer: Self-pay | Admitting: *Deleted

## 2016-03-13 DIAGNOSIS — C50512 Malignant neoplasm of lower-outer quadrant of left female breast: Secondary | ICD-10-CM

## 2016-03-13 DIAGNOSIS — Z510 Encounter for antineoplastic radiation therapy: Secondary | ICD-10-CM | POA: Diagnosis not present

## 2016-03-13 NOTE — Telephone Encounter (Signed)
lvm for pt regarding to 6.29 appt.Marland KitchenMarland KitchenMarland KitchenMarland Kitchenpt ok and aware

## 2016-03-13 NOTE — Telephone Encounter (Signed)
returned call and s.w. pt and confirmed 6.29 appt

## 2016-03-13 NOTE — Progress Notes (Signed)
  Radiation Oncology         (336) [phone] ________________________________  Name: Kristen Snyder MRN: [id number]  Date: 03/13/2016  DOB: [birth year]  DIAGNOSIS:     ICD-9-CM ICD-10-CM   1. Breast cancer of lower-outer quadrant of left female breast ([place]) 174.5 C50.512      SIMULATION AND TREATMENT PLANNING NOTE  The patient presented for simulation prior to beginning her course of radiation treatment for her diagnosis of Left-sided breast cancer. The patient was placed in a supine position on a breast board. A customized vac-lock bag was constructed and this complex treatment device will be used on a daily basis during her treatment. In this fashion, a CT scan was obtained through the chest area and an isocenter was placed near the chest wall within the breast.  The patient will be planned to receive a course of radiation initially to a dose of 50.4 Gy. This will consist of a whole breast radiotherapy technique. To accomplish this, 2 customized blocks have been designed which will correspond to medial and lateral whole breast tangent fields. This treatment will be accomplished at 1.8 Gy per fraction. A forward planning technique will also be evaluated to determine if this approach improves the plan. It is anticipated that the patient will then receive a 10 Gy boost to the seroma cavity which has been contoured. This will be accomplished at 2 Gy per fraction.   This initial treatment will consist of a 3-D conformal technique. The seroma has been contoured as the primary target structure. Additionally, dose volume histograms of both this target as well as the lungs and heart will also be evaluated. Such an approach is necessary to ensure that the target area is adequately covered while the nearby critical  normal structures are adequately spared.  Plan:  The final anticipated total dose therefore will correspond to 60.4 Gy.   Special treatment procedure was performed today due to the extra  time and effort required by myself to plan and prepare this patient for deep inspiration breath hold technique.  I have determined cardiac sparing to be of benefit to this patient to prevent long term cardiac damage due to radiation of the heart.  Bellows were placed on the patient's abdomen. To facilitate cardiac sparing, the patient was coached by the radiation therapists on breath hold techniques and breathing practice was performed. Practice waveforms were obtained. The patient was then scanned while maintaining breath hold in the treatment position.  This image was then transferred over to the imaging specialist. The imaging specialist then created a fusion of the free breathing and breath hold scans using the chest wall as the stable structure. I personally reviewed the fusion in axial, coronal and sagittal image planes.  Excellent cardiac sparing was obtained.  I felt the patient is an appropriate candidate for breath hold and the patient will be treated as such.  The image fusion was then reviewed with the patient to reinforce the necessity of reproducible breath hold.     _______________________________   Jodelle Gross, MD, PhD

## 2016-03-13 NOTE — Progress Notes (Signed)
  Radiation Oncology         (336) [phone] ________________________________  Name: Kristen Snyder MRN: [id number]  Date: 03/13/2016  DOB: [birth year]  Optical Surface Tracking Plan:  Since intensity modulated radiotherapy (IMRT) and 3D conformal radiation treatment methods are predicated on accurate and precise positioning for treatment, intrafraction motion monitoring is medically necessary to ensure accurate and safe treatment delivery.  The ability to quantify intrafraction motion without excessive ionizing radiation dose can only be performed with optical surface tracking. Accordingly, surface imaging offers the opportunity to obtain 3D measurements of patient position throughout IMRT and 3D treatments without excessive radiation exposure.  I am ordering optical surface tracking for this patient's upcoming course of radiotherapy. ________________________________  Kyung Rudd, MD 03/13/2016 5:47 PM    Reference:   Ursula Alert, J, et al. Surface imaging-based analysis of intrafraction motion for breast radiotherapy patients.Journal of [place], n. 6, nov. 2014. ISSN [id number].   Available at: <http://www.jacmp.org/index.php/jacmp/article/view/4957>.

## 2016-03-18 DIAGNOSIS — Z510 Encounter for antineoplastic radiation therapy: Secondary | ICD-10-CM | POA: Diagnosis not present

## 2016-03-19 DIAGNOSIS — Z510 Encounter for antineoplastic radiation therapy: Secondary | ICD-10-CM | POA: Diagnosis not present

## 2016-03-20 ENCOUNTER — Ambulatory Visit
Admission: RE | Admit: 2016-03-20 | Discharge: 2016-03-20 | Disposition: A | Discharge status: Home / Self Care | Payer: Medicaid Other | Source: Ambulatory Visit | Attending: Radiation Oncology | Admitting: Radiation Oncology

## 2016-03-20 DIAGNOSIS — Z510 Encounter for antineoplastic radiation therapy: Secondary | ICD-10-CM | POA: Diagnosis not present

## 2016-03-20 DIAGNOSIS — C50512 Malignant neoplasm of lower-outer quadrant of left female breast: Secondary | ICD-10-CM

## 2016-03-20 MED ORDER — ALRA NON-METALLIC DEODORANT (RAD-ONC)
1.0000 "application " | Freq: Once | TOPICAL | Status: AC
  Administered 2016-03-20: 1 via TOPICAL

## 2016-03-20 MED ORDER — RADIAPLEXRX EX GEL
Freq: Once | CUTANEOUS | Status: AC
  Administered 2016-03-20: 13:00:00 via TOPICAL

## 2016-03-20 NOTE — Progress Notes (Signed)
Patient education done,radiation therapy and you book, my business card, alra and radiaplex given to patient, discussed ways to manage side effects, skin irritation, soreness,swelling breast, fatigue,pain, increase protein in diet, drink plenty water/fluids,stay hydrated,  Use electric razor on under arm  Teach back given 1:23 PM

## 2016-03-23 ENCOUNTER — Ambulatory Visit
Admission: RE | Admit: 2016-03-23 | Discharge: 2016-03-23 | Disposition: A | Discharge status: Home / Self Care | Payer: Medicaid Other | Source: Ambulatory Visit | Attending: Radiation Oncology | Admitting: Radiation Oncology

## 2016-03-23 DIAGNOSIS — Z510 Encounter for antineoplastic radiation therapy: Secondary | ICD-10-CM | POA: Diagnosis not present

## 2016-03-24 ENCOUNTER — Ambulatory Visit
Admission: RE | Admit: 2016-03-24 | Discharge: 2016-03-24 | Disposition: A | Discharge status: Home / Self Care | Payer: Medicaid Other | Source: Ambulatory Visit | Attending: Radiation Oncology | Admitting: Radiation Oncology

## 2016-03-24 DIAGNOSIS — Z510 Encounter for antineoplastic radiation therapy: Secondary | ICD-10-CM | POA: Diagnosis not present

## 2016-03-25 ENCOUNTER — Ambulatory Visit
Admission: RE | Admit: 2016-03-25 | Discharge: 2016-03-25 | Disposition: A | Discharge status: Home / Self Care | Payer: Medicaid Other | Source: Ambulatory Visit | Attending: Radiation Oncology | Admitting: Radiation Oncology

## 2016-03-25 DIAGNOSIS — Z510 Encounter for antineoplastic radiation therapy: Secondary | ICD-10-CM | POA: Diagnosis not present

## 2016-03-26 ENCOUNTER — Ambulatory Visit
Admission: RE | Admit: 2016-03-26 | Discharge: 2016-03-26 | Disposition: A | Discharge status: Home / Self Care | Payer: Medicaid Other | Source: Ambulatory Visit | Attending: Radiation Oncology | Admitting: Radiation Oncology

## 2016-03-26 DIAGNOSIS — Z510 Encounter for antineoplastic radiation therapy: Secondary | ICD-10-CM | POA: Diagnosis not present

## 2016-03-27 ENCOUNTER — Ambulatory Visit
Admission: RE | Admit: 2016-03-27 | Discharge: 2016-03-27 | Disposition: A | Discharge status: Home / Self Care | Payer: Medicaid Other | Source: Ambulatory Visit | Attending: Radiation Oncology | Admitting: Radiation Oncology

## 2016-03-27 VITALS — BP 125/91 | HR 87 | Temp 98.1°F | Ht 64.0 in | Wt 196.9 lb

## 2016-03-27 DIAGNOSIS — C50512 Malignant neoplasm of lower-outer quadrant of left female breast: Secondary | ICD-10-CM

## 2016-03-27 DIAGNOSIS — Z510 Encounter for antineoplastic radiation therapy: Secondary | ICD-10-CM | POA: Diagnosis not present

## 2016-03-27 NOTE — Progress Notes (Signed)
Ms. Provins has received 5 fractions to her left breast. No voiced concerns at this time.  Note hyperpigmentation in the areola region and in the inframmary fold.  Skin intact.  Denies fatigue.

## 2016-03-27 NOTE — Progress Notes (Signed)
   Department of Radiation Oncology  Phone:  [phone] Fax:        [phone]  Weekly Treatment Note    Name: Kristen Snyder Date: 03/27/2016 MRN: [id number] DOB: 02/14/1966   Diagnosis:     ICD-9-CM ICD-10-CM   1. Breast cancer of lower-outer quadrant of left female breast ([facility]) 174.5 C50.512      Current dose: 9 Gy  Current fraction: 5   MEDICATIONS: Current Outpatient Prescriptions  Medication Sig Dispense Refill  . ACCU-CHEK AVIVA PLUS test strip   0  . aspirin EC 81 MG tablet Take 1 tablet (81 mg total) by mouth daily. 30 tablet 2  . carvedilol (COREG) 12.5 MG tablet Take 1 tablet (12.5 mg total) by mouth 2 (two) times daily with a meal. 270 tablet 3  . insulin glargine (LANTUS) 100 UNIT/ML injection Inject 80 Units into the skin at bedtime.     Marland Kitchen lisinopril (PRINIVIL,ZESTRIL) 20 MG tablet Take 20 mg by mouth 2 (two) times daily.  0  . Multiple Vitamin (MULTIVITAMIN) tablet Take 1 tablet by mouth daily.    Marland Kitchen spironolactone (ALDACTONE) 25 MG tablet Take 1 tablet (25 mg total) by mouth daily. 90 tablet 3  . LEVEMIR FLEXTOUCH 100 UNIT/ML Pen   0   No current facility-administered medications for this encounter.     ALLERGIES: Bidil   LABORATORY DATA:  Lab Results  Component Value Date   WBC 6.5 10/19/2014   HGB 10.8* 10/19/2014   HCT 35.1* 10/19/2014   MCV 62.3* 10/19/2014   PLT 403* 10/19/2014   Lab Results  Component Value Date   NA 136 02/12/2016   K 4.4 02/12/2016   CL 101 02/12/2016   CO2 24 02/12/2016   Lab Results  Component Value Date   ALT 52* 10/19/2014   AST 35 10/19/2014   ALKPHOS 83 10/19/2014   BILITOT 0.4 10/19/2014     NARRATIVE: Kristen Snyder was seen today for weekly treatment management. The chart was checked and the patient's films were reviewed.  Ms. Dexheimer has received 5 fractions to her left breast. No voiced concerns at this time.  Note hyperpigmentation in the areola region and in the inframmary fold.  Skin  intact.  Denies fatigue.  PHYSICAL EXAMINATION: height is 5\' 4"  (1.626 m) and weight is 196 lb 14.4 oz (89.313 kg). Her temperature is 98.1 F (36.7 C). Her blood pressure is 125/91 and her pulse is 87.        ASSESSMENT: The patient is doing satisfactorily with treatment.  PLAN: We will continue with the patient's radiation treatment as planned.

## 2016-03-30 ENCOUNTER — Ambulatory Visit
Admission: RE | Admit: 2016-03-30 | Discharge: 2016-03-30 | Disposition: A | Discharge status: Home / Self Care | Payer: Medicaid Other | Source: Ambulatory Visit | Attending: Radiation Oncology | Admitting: Radiation Oncology

## 2016-03-30 DIAGNOSIS — Z510 Encounter for antineoplastic radiation therapy: Secondary | ICD-10-CM | POA: Diagnosis not present

## 2016-03-31 ENCOUNTER — Telehealth: Payer: Self-pay | Admitting: *Deleted

## 2016-03-31 ENCOUNTER — Ambulatory Visit
Admission: RE | Admit: 2016-03-31 | Discharge: 2016-03-31 | Disposition: A | Discharge status: Home / Self Care | Payer: Medicaid Other | Source: Ambulatory Visit | Attending: Radiation Oncology | Admitting: Radiation Oncology

## 2016-03-31 DIAGNOSIS — Z510 Encounter for antineoplastic radiation therapy: Secondary | ICD-10-CM | POA: Diagnosis not present

## 2016-03-31 NOTE — Telephone Encounter (Signed)
Left message for a return phone call to follow up after start of radiation.   

## 2016-04-01 ENCOUNTER — Ambulatory Visit
Admission: RE | Admit: 2016-04-01 | Discharge: 2016-04-01 | Disposition: A | Discharge status: Home / Self Care | Payer: Medicaid Other | Source: Ambulatory Visit | Attending: Radiation Oncology | Admitting: Radiation Oncology

## 2016-04-01 DIAGNOSIS — Z510 Encounter for antineoplastic radiation therapy: Secondary | ICD-10-CM | POA: Diagnosis not present

## 2016-04-02 ENCOUNTER — Ambulatory Visit
Admission: RE | Admit: 2016-04-02 | Discharge: 2016-04-02 | Disposition: A | Discharge status: Home / Self Care | Payer: Medicaid Other | Source: Ambulatory Visit | Attending: Radiation Oncology | Admitting: Radiation Oncology

## 2016-04-02 DIAGNOSIS — Z510 Encounter for antineoplastic radiation therapy: Secondary | ICD-10-CM | POA: Diagnosis not present

## 2016-04-03 ENCOUNTER — Ambulatory Visit
Admission: RE | Admit: 2016-04-03 | Discharge: 2016-04-03 | Disposition: A | Discharge status: Home / Self Care | Payer: Medicaid Other | Source: Ambulatory Visit | Attending: Radiation Oncology | Admitting: Radiation Oncology

## 2016-04-03 ENCOUNTER — Encounter: Payer: Self-pay | Admitting: Radiation Oncology

## 2016-04-03 VITALS — BP 142/96 | HR 95 | Temp 98.1°F | Resp 20 | Wt 195.0 lb

## 2016-04-03 DIAGNOSIS — Z510 Encounter for antineoplastic radiation therapy: Secondary | ICD-10-CM | POA: Diagnosis not present

## 2016-04-03 DIAGNOSIS — C50512 Malignant neoplasm of lower-outer quadrant of left female breast: Secondary | ICD-10-CM

## 2016-04-03 NOTE — Progress Notes (Signed)
Weekly rad txs 10/33 left breast completed, mild tanning,skin intact,nipple and inframmary fold  Tanning, using radaiplex bid, appetite good, mild fatigue, nopain BP 142/96 mmHg  Pulse 95  Temp(Src) 98.1 F (36.7 C) (Oral)  Resp 20  Wt 195 lb (88.451 kg)  LMP 04/16/2015  Wt Readings from Last 3 Encounters:  04/03/16 195 lb (88.451 kg)  03/27/16 196 lb 14.4 oz (89.313 kg)  03/10/16 195 lb (88.451 kg)

## 2016-04-03 NOTE — Progress Notes (Signed)
   Department of Radiation Oncology  Phone:  [phone] Fax:        [phone]  Weekly Treatment Note    Name: Kristen Snyder Date: 04/03/2016 MRN: [id number] DOB: January 26, 1966   Diagnosis:     ICD-9-CM ICD-10-CM   1. Breast cancer of lower-outer quadrant of left female breast ([facility]) 174.5 C50.512      Current dose: 18 Gy  Current fraction: 10   MEDICATIONS: Current Outpatient Prescriptions  Medication Sig Dispense Refill  . ACCU-CHEK AVIVA PLUS test strip   0  . aspirin EC 81 MG tablet Take 1 tablet (81 mg total) by mouth daily. 30 tablet 2  . carvedilol (COREG) 12.5 MG tablet Take 1 tablet (12.5 mg total) by mouth 2 (two) times daily with a meal. 270 tablet 3  . insulin glargine (LANTUS) 100 UNIT/ML injection Inject 80 Units into the skin at bedtime.     Marland Kitchen LEVEMIR FLEXTOUCH 100 UNIT/ML Pen   0  . lisinopril (PRINIVIL,ZESTRIL) 20 MG tablet Take 20 mg by mouth 2 (two) times daily.  0  . Multiple Vitamin (MULTIVITAMIN) tablet Take 1 tablet by mouth daily.    Marland Kitchen spironolactone (ALDACTONE) 25 MG tablet Take 1 tablet (25 mg total) by mouth daily. 90 tablet 3   No current facility-administered medications for this encounter.     ALLERGIES: Bidil   LABORATORY DATA:  Lab Results  Component Value Date   WBC 6.5 10/19/2014   HGB 10.8* 10/19/2014   HCT 35.1* 10/19/2014   MCV 62.3* 10/19/2014   PLT 403* 10/19/2014   Lab Results  Component Value Date   NA 136 02/12/2016   K 4.4 02/12/2016   CL 101 02/12/2016   CO2 24 02/12/2016   Lab Results  Component Value Date   ALT 52* 10/19/2014   AST 35 10/19/2014   ALKPHOS 83 10/19/2014   BILITOT 0.4 10/19/2014     NARRATIVE: [name] was seen today for weekly treatment management. The chart was checked and the patient's films were reviewed.  Weekly rad txs 10/33 left breast completed, mild tanning,skin intact,nipple and inframmary fold  Tanning, using radaiplex bid, appetite good, mild fatigue, nopain BP  142/96 mmHg  Pulse 95  Temp(Src) 98.1 F (36.7 C) (Oral)  Resp 20  Wt 195 lb (88.451 kg)  LMP 04/16/2015  Wt Readings from Last 3 Encounters:  04/03/16 195 lb (88.451 kg)  03/27/16 196 lb 14.4 oz (89.313 kg)  03/10/16 195 lb (88.451 kg)    PHYSICAL EXAMINATION: weight is 195 lb (88.451 kg). Her oral temperature is 98.1 F (36.7 C). Her blood pressure is 142/96 and her pulse is 95. Her respiration is 20.      Mild hyperpigmentation present  ASSESSMENT: The patient is doing satisfactorily with treatment.  PLAN: We will continue with the patient's radiation treatment as planned.

## 2016-04-06 ENCOUNTER — Ambulatory Visit
Admission: RE | Admit: 2016-04-06 | Discharge: 2016-04-06 | Disposition: A | Discharge status: Home / Self Care | Payer: Medicaid Other | Source: Ambulatory Visit | Attending: Radiation Oncology | Admitting: Radiation Oncology

## 2016-04-06 DIAGNOSIS — Z510 Encounter for antineoplastic radiation therapy: Secondary | ICD-10-CM | POA: Diagnosis not present

## 2016-04-07 ENCOUNTER — Ambulatory Visit
Admission: RE | Admit: 2016-04-07 | Discharge: 2016-04-07 | Disposition: A | Discharge status: Home / Self Care | Payer: Medicaid Other | Source: Ambulatory Visit | Attending: Radiation Oncology | Admitting: Radiation Oncology

## 2016-04-07 DIAGNOSIS — Z510 Encounter for antineoplastic radiation therapy: Secondary | ICD-10-CM | POA: Diagnosis not present

## 2016-04-08 ENCOUNTER — Ambulatory Visit
Admission: RE | Admit: 2016-04-08 | Discharge: 2016-04-08 | Disposition: A | Discharge status: Home / Self Care | Payer: Medicaid Other | Source: Ambulatory Visit | Attending: Radiation Oncology | Admitting: Radiation Oncology

## 2016-04-08 ENCOUNTER — Encounter: Payer: Self-pay | Admitting: Radiation Oncology

## 2016-04-08 DIAGNOSIS — Z510 Encounter for antineoplastic radiation therapy: Secondary | ICD-10-CM | POA: Diagnosis not present

## 2016-04-09 ENCOUNTER — Ambulatory Visit
Admission: RE | Admit: 2016-04-09 | Discharge: 2016-04-09 | Disposition: A | Discharge status: Home / Self Care | Payer: Medicaid Other | Source: Ambulatory Visit | Attending: Radiation Oncology | Admitting: Radiation Oncology

## 2016-04-09 ENCOUNTER — Encounter: Payer: Self-pay | Admitting: Radiation Oncology

## 2016-04-09 DIAGNOSIS — Z510 Encounter for antineoplastic radiation therapy: Secondary | ICD-10-CM | POA: Diagnosis not present

## 2016-04-10 ENCOUNTER — Ambulatory Visit
Admission: RE | Admit: 2016-04-10 | Discharge: 2016-04-10 | Disposition: A | Discharge status: Home / Self Care | Payer: Medicaid Other | Source: Ambulatory Visit | Attending: Radiation Oncology | Admitting: Radiation Oncology

## 2016-04-10 ENCOUNTER — Encounter: Payer: Self-pay | Admitting: Radiation Oncology

## 2016-04-10 VITALS — BP 135/79 | HR 97 | Temp 98.3°F | Resp 20 | Wt 195.7 lb

## 2016-04-10 DIAGNOSIS — Z510 Encounter for antineoplastic radiation therapy: Secondary | ICD-10-CM | POA: Diagnosis not present

## 2016-04-10 DIAGNOSIS — C50512 Malignant neoplasm of lower-outer quadrant of left female breast: Secondary | ICD-10-CM

## 2016-04-10 NOTE — Progress Notes (Signed)
   Department of Radiation Oncology  Phone:  [phone] Fax:        [phone]  Weekly Treatment Note    Name: Kristen Snyder Date: 04/10/2016 MRN: [id number] DOB: 10/18/1966   Diagnosis:     ICD-9-CM ICD-10-CM   1. Breast cancer of lower-outer quadrant of left female breast (HCC) 174.5 C50.512      Current dose: 27 Gy  Current fraction: 15   MEDICATIONS: Current Outpatient Prescriptions  Medication Sig Dispense Refill  . ACCU-CHEK AVIVA PLUS test strip   0  . aspirin EC 81 MG tablet Take 1 tablet (81 mg total) by mouth daily. 30 tablet 2  . carvedilol (COREG) 12.5 MG tablet Take 1 tablet (12.5 mg total) by mouth 2 (two) times daily with a meal. 270 tablet 3  . hyaluronate sodium (RADIAPLEXRX) GEL Apply 1 application topically 2 (two) times daily.    . insulin glargine (LANTUS) 100 UNIT/ML injection Inject 80 Units into the skin at bedtime.     Marland Kitchen LEVEMIR FLEXTOUCH 100 UNIT/ML Pen   0  . Multiple Vitamin (MULTIVITAMIN) tablet Take 1 tablet by mouth daily.    . non-metallic deodorant Jethro Poling) MISC Apply 1 application topically daily as needed.    Marland Kitchen spironolactone (ALDACTONE) 25 MG tablet Take 1 tablet (25 mg total) by mouth daily. 90 tablet 3  . lisinopril (PRINIVIL,ZESTRIL) 40 MG tablet Take 40 mg by mouth daily.  0   No current facility-administered medications for this encounter.     ALLERGIES: Bidil   LABORATORY DATA:  Lab Results  Component Value Date   WBC 6.5 10/19/2014   HGB 10.8* 10/19/2014   HCT 35.1* 10/19/2014   MCV 62.3* 10/19/2014   PLT 403* 10/19/2014   Lab Results  Component Value Date   NA 136 02/12/2016   K 4.4 02/12/2016   CL 101 02/12/2016   CO2 24 02/12/2016   Lab Results  Component Value Date   ALT 52* 10/19/2014   AST 35 10/19/2014   ALKPHOS 83 10/19/2014   BILITOT 0.4 10/19/2014     NARRATIVE: [name] was seen today for weekly treatment management. The chart was checked and the patient's films were  reviewed.  Weekly rad txs left breast 15/33 completed, erythema  On breast, skin intact , using radiaplex bid, appetite good, energy level is okay, drinks plenty water stated,  6:09 PM BP 135/79 mmHg  Pulse 97  Temp(Src) 98.3 F (36.8 C) (Oral)  Resp 20  Wt 195 lb 11.2 oz (88.769 kg)  LMP 04/16/2015  Wt Readings from Last 3 Encounters:  04/10/16 195 lb 11.2 oz (88.769 kg)  04/03/16 195 lb (88.451 kg)  03/27/16 196 lb 14.4 oz (89.313 kg)    PHYSICAL EXAMINATION: weight is 195 lb 11.2 oz (88.769 kg). Her oral temperature is 98.3 F (36.8 C). Her blood pressure is 135/79 and her pulse is 97. Her respiration is 20.      Some hyperpigmentation present in the treatment area is  ASSESSMENT: The patient is doing satisfactorily with treatment.  PLAN: We will continue with the patient's radiation treatment as planned.

## 2016-04-10 NOTE — Progress Notes (Signed)
Weekly rad txs left breast 15/33 completed, erythema  On breast, skin intact , using radiaplex bid, appetite good, energy level is okay, drinks plenty water stated,  11:04 AM BP 135/79 mmHg  Pulse 97  Temp(Src) 98.3 F (36.8 C) (Oral)  Resp 20  Wt 195 lb 11.2 oz (88.769 kg)  LMP 04/16/2015  Wt Readings from Last 3 Encounters:  04/10/16 195 lb 11.2 oz (88.769 kg)  04/03/16 195 lb (88.451 kg)  03/27/16 196 lb 14.4 oz (89.313 kg)

## 2016-04-14 ENCOUNTER — Ambulatory Visit
Admission: RE | Admit: 2016-04-14 | Discharge: 2016-04-14 | Disposition: A | Discharge status: Home / Self Care | Payer: Medicaid Other | Source: Ambulatory Visit | Attending: Radiation Oncology | Admitting: Radiation Oncology

## 2016-04-14 DIAGNOSIS — Z510 Encounter for antineoplastic radiation therapy: Secondary | ICD-10-CM | POA: Diagnosis not present

## 2016-04-15 ENCOUNTER — Ambulatory Visit
Admission: RE | Admit: 2016-04-15 | Discharge: 2016-04-15 | Disposition: A | Discharge status: Home / Self Care | Payer: Medicaid Other | Source: Ambulatory Visit | Attending: Radiation Oncology | Admitting: Radiation Oncology

## 2016-04-15 DIAGNOSIS — Z510 Encounter for antineoplastic radiation therapy: Secondary | ICD-10-CM | POA: Diagnosis not present

## 2016-04-16 ENCOUNTER — Ambulatory Visit
Admission: RE | Admit: 2016-04-16 | Discharge: 2016-04-16 | Disposition: A | Discharge status: Home / Self Care | Payer: Medicaid Other | Source: Ambulatory Visit | Attending: Radiation Oncology | Admitting: Radiation Oncology

## 2016-04-16 DIAGNOSIS — Z510 Encounter for antineoplastic radiation therapy: Secondary | ICD-10-CM | POA: Diagnosis not present

## 2016-04-17 ENCOUNTER — Ambulatory Visit
Admission: RE | Admit: 2016-04-17 | Discharge: 2016-04-17 | Disposition: A | Discharge status: Home / Self Care | Payer: Medicaid Other | Source: Ambulatory Visit | Attending: Radiation Oncology | Admitting: Radiation Oncology

## 2016-04-17 ENCOUNTER — Encounter: Payer: Self-pay | Admitting: Radiation Oncology

## 2016-04-17 VITALS — BP 141/84 | HR 91 | Temp 97.9°F | Resp 20 | Wt 195.0 lb

## 2016-04-17 DIAGNOSIS — C50512 Malignant neoplasm of lower-outer quadrant of left female breast: Secondary | ICD-10-CM | POA: Diagnosis present

## 2016-04-17 DIAGNOSIS — Z510 Encounter for antineoplastic radiation therapy: Secondary | ICD-10-CM | POA: Diagnosis not present

## 2016-04-17 MED ORDER — RADIAPLEXRX EX GEL
Freq: Once | CUTANEOUS | Status: AC
  Administered 2016-04-17: 12:00:00 via TOPICAL

## 2016-04-17 NOTE — Progress Notes (Signed)
   Department of Radiation Oncology  Phone:  [phone] Fax:        [phone]  Weekly Treatment Note    Name: Kristen Snyder Date: 04/17/2016 MRN: [id number] DOB: 1966-02-14   Diagnosis:     ICD-9-CM ICD-10-CM   1. Breast cancer of lower-outer quadrant of left female breast (HCC) 174.5 C50.512      Current dose: 34.2 Gy  Current fraction: 19   MEDICATIONS: Current Outpatient Prescriptions  Medication Sig Dispense Refill  . ACCU-CHEK AVIVA PLUS test strip   0  . aspirin EC 81 MG tablet Take 1 tablet (81 mg total) by mouth daily. 30 tablet 2  . carvedilol (COREG) 12.5 MG tablet Take 1 tablet (12.5 mg total) by mouth 2 (two) times daily with a meal. 270 tablet 3  . hyaluronate sodium (RADIAPLEXRX) GEL Apply 1 application topically 2 (two) times daily.    Marland Kitchen LEVEMIR FLEXTOUCH 100 UNIT/ML Pen   0  . lisinopril (PRINIVIL,ZESTRIL) 40 MG tablet Take 40 mg by mouth daily.  0  . Multiple Vitamin (MULTIVITAMIN) tablet Take 1 tablet by mouth daily.    . non-metallic deodorant Jethro Poling) MISC Apply 1 application topically daily as needed.    Marland Kitchen spironolactone (ALDACTONE) 25 MG tablet Take 1 tablet (25 mg total) by mouth daily. 90 tablet 3  . LANTUS SOLOSTAR 100 UNIT/ML Solostar Pen Inject 15 Units into the skin at bedtime.  0   No current facility-administered medications for this encounter.     ALLERGIES: Bidil   LABORATORY DATA:  Lab Results  Component Value Date   WBC 6.5 10/19/2014   HGB 10.8* 10/19/2014   HCT 35.1* 10/19/2014   MCV 62.3* 10/19/2014   PLT 403* 10/19/2014   Lab Results  Component Value Date   NA 136 02/12/2016   K 4.4 02/12/2016   CL 101 02/12/2016   CO2 24 02/12/2016   Lab Results  Component Value Date   ALT 52* 10/19/2014   AST 35 10/19/2014   ALKPHOS 83 10/19/2014   BILITOT 0.4 10/19/2014     NARRATIVE: Kristen Snyder was seen today for weekly treatment management. The chart was checked and the patient's films were reviewed.  Weekly  rad tx left breast   Hyperpigmentation under axilla  And breast, has some peeling dy desquamation , using radaiplex  Bid,gave another tube, appetite good, no pain,  Some mild fatigue Wt Readings from Last 3 Encounters:  04/17/16 195 lb (88.451 kg)  04/10/16 195 lb 11.2 oz (88.769 kg)  04/03/16 195 lb (88.451 kg)    PHYSICAL EXAMINATION: weight is 195 lb (88.451 kg). Her oral temperature is 97.9 F (36.6 C). Her blood pressure is 141/84 and her pulse is 91. Her respiration is 20.        ASSESSMENT: The patient is doing satisfactorily with treatment.  PLAN: We will continue with the patient's radiation treatment as planned. The patient will begin using hydrocortisone cream as well if needed for itching.

## 2016-04-17 NOTE — Addendum Note (Signed)
Encounter addended by: Doreen Beam, RN on: 04/17/2016 12:06 PM<BR>     Documentation filed: [employer], Orders

## 2016-04-17 NOTE — Progress Notes (Signed)
Weekly rad tx left breast   Hyperpigmentation under axilla  And breast, has some peeling dy desquamation , using radaiplex  Bid,gave another tube, appetite good, no pain,  Some mild fatigue Wt Readings from Last 3 Encounters:  04/10/16 195 lb 11.2 oz (88.769 kg)  04/03/16 195 lb (88.451 kg)  03/27/16 196 lb 14.4 oz (89.313 kg)

## 2016-04-17 NOTE — Addendum Note (Signed)
Encounter addended by: Doreen Beam, RN on: 04/17/2016 12:08 PM<BR>     Documentation filed: Inpatient Document Flowsheet, Inpatient [facility]

## 2016-04-20 ENCOUNTER — Ambulatory Visit
Admission: RE | Admit: 2016-04-20 | Discharge: 2016-04-20 | Disposition: A | Discharge status: Home / Self Care | Payer: Medicaid Other | Source: Ambulatory Visit | Attending: Radiation Oncology | Admitting: Radiation Oncology

## 2016-04-20 DIAGNOSIS — Z510 Encounter for antineoplastic radiation therapy: Secondary | ICD-10-CM | POA: Diagnosis not present

## 2016-04-21 ENCOUNTER — Ambulatory Visit
Admission: RE | Admit: 2016-04-21 | Discharge: 2016-04-21 | Disposition: A | Discharge status: Home / Self Care | Payer: Medicaid Other | Source: Ambulatory Visit | Attending: Radiation Oncology | Admitting: Radiation Oncology

## 2016-04-21 DIAGNOSIS — Z510 Encounter for antineoplastic radiation therapy: Secondary | ICD-10-CM | POA: Diagnosis not present

## 2016-04-22 ENCOUNTER — Telehealth: Payer: Self-pay | Admitting: Hematology

## 2016-04-22 ENCOUNTER — Ambulatory Visit: Admission: RE | Admit: 2016-04-22 | Payer: Medicaid Other | Source: Ambulatory Visit

## 2016-04-22 NOTE — Telephone Encounter (Signed)
lvm for pt regarding to 6.29 moved to 6.30.Marland KitchenMarland KitchenMarland Kitchen

## 2016-04-23 ENCOUNTER — Ambulatory Visit
Admission: RE | Admit: 2016-04-23 | Discharge: 2016-04-23 | Disposition: A | Discharge status: Home / Self Care | Payer: Medicaid Other | Source: Ambulatory Visit | Attending: Radiation Oncology | Admitting: Radiation Oncology

## 2016-04-23 DIAGNOSIS — Z510 Encounter for antineoplastic radiation therapy: Secondary | ICD-10-CM | POA: Diagnosis not present

## 2016-04-24 ENCOUNTER — Ambulatory Visit: Payer: Medicaid Other | Admitting: Radiation Oncology

## 2016-04-24 ENCOUNTER — Ambulatory Visit
Admission: RE | Admit: 2016-04-24 | Discharge: 2016-04-24 | Disposition: A | Discharge status: Home / Self Care | Payer: Medicaid Other | Source: Ambulatory Visit | Attending: Radiation Oncology | Admitting: Radiation Oncology

## 2016-04-24 ENCOUNTER — Encounter: Payer: Self-pay | Admitting: Radiation Oncology

## 2016-04-24 VITALS — BP 147/107 | HR 81 | Temp 97.8°F | Resp 16 | Wt 198.5 lb

## 2016-04-24 DIAGNOSIS — C50512 Malignant neoplasm of lower-outer quadrant of left female breast: Secondary | ICD-10-CM

## 2016-04-24 DIAGNOSIS — Z510 Encounter for antineoplastic radiation therapy: Secondary | ICD-10-CM | POA: Diagnosis not present

## 2016-04-24 NOTE — Progress Notes (Signed)
Weekly rad txs left breast 23/33 completed, dry desquamation under axilla, hyperpigmentatin,skin intact, using radiaplex bid, no c/o  Pain, has slight itchiness side of breast 11:07 AM BP 147/107 mmHg  Pulse 81  Temp(Src) 97.8 F (36.6 C) (Oral)  Resp 16  Wt 198 lb 8 oz (90.039 kg)  LMP 04/16/2015  Wt Readings from Last 3 Encounters:  04/24/16 198 lb 8 oz (90.039 kg)  04/17/16 195 lb (88.451 kg)  04/10/16 195 lb 11.2 oz (88.769 kg)

## 2016-04-26 NOTE — Progress Notes (Signed)
   Department of Radiation Oncology  Phone:  [phone] Fax:        [phone]  Weekly Treatment Note    Name: Kristen Snyder Date: 04/26/2016 MRN: [id number] DOB: 1966/02/18   Diagnosis:     ICD-9-CM ICD-10-CM   1. Breast cancer of lower-outer quadrant of left female breast (HCC) 174.5 C50.512      Current dose: 41.4 Gy  Current fraction: 23   MEDICATIONS: Current Outpatient Prescriptions  Medication Sig Dispense Refill  . ACCU-CHEK AVIVA PLUS test strip   0  . aspirin EC 81 MG tablet Take 1 tablet (81 mg total) by mouth daily. 30 tablet 2  . carvedilol (COREG) 12.5 MG tablet Take 1 tablet (12.5 mg total) by mouth 2 (two) times daily with a meal. 270 tablet 3  . hyaluronate sodium (RADIAPLEXRX) GEL Apply 1 application topically 2 (two) times daily.    Marland Kitchen LANTUS SOLOSTAR 100 UNIT/ML Solostar Pen Inject 15 Units into the skin at bedtime.  0  . LEVEMIR FLEXTOUCH 100 UNIT/ML Pen   0  . lisinopril (PRINIVIL,ZESTRIL) 40 MG tablet Take 40 mg by mouth daily.  0  . Multiple Vitamin (MULTIVITAMIN) tablet Take 1 tablet by mouth daily.    . non-metallic deodorant Jethro Poling) MISC Apply 1 application topically daily as needed.    Marland Kitchen spironolactone (ALDACTONE) 25 MG tablet Take 1 tablet (25 mg total) by mouth daily. 90 tablet 3   No current facility-administered medications for this encounter.     ALLERGIES: Bidil   LABORATORY DATA:  Lab Results  Component Value Date   WBC 6.5 10/19/2014   HGB 10.8* 10/19/2014   HCT 35.1* 10/19/2014   MCV 62.3* 10/19/2014   PLT 403* 10/19/2014   Lab Results  Component Value Date   NA 136 02/12/2016   K 4.4 02/12/2016   CL 101 02/12/2016   CO2 24 02/12/2016   Lab Results  Component Value Date   ALT 52* 10/19/2014   AST 35 10/19/2014   ALKPHOS 83 10/19/2014   BILITOT 0.4 10/19/2014     NARRATIVE: Kristen Snyder was seen today for weekly treatment management. The chart was checked and the patient's films were  reviewed.  Weekly rad txs left breast 23/33 completed, dry desquamation under axilla, hyperpigmentatin,skin intact, using radiaplex bid, no c/o  Pain, has slight itchiness side of breast 2:14 PM BP 147/107 mmHg  Pulse 81  Temp(Src) 97.8 F (36.6 C) (Oral)  Resp 16  Wt 198 lb 8 oz (90.039 kg)  LMP 04/16/2015  Wt Readings from Last 3 Encounters:  04/24/16 198 lb 8 oz (90.039 kg)  04/17/16 195 lb (88.451 kg)  04/10/16 195 lb 11.2 oz (88.769 kg)    PHYSICAL EXAMINATION: weight is 198 lb 8 oz (90.039 kg). Her oral temperature is 97.8 F (36.6 C). Her blood pressure is 147/107 and her pulse is 81. Her respiration is 16.      Hyperpigmentation present in the treatment area. No moist desquamation.  ASSESSMENT: The patient is doing satisfactorily with treatment.  PLAN: We will continue with the patient's radiation treatment as planned.

## 2016-04-27 ENCOUNTER — Ambulatory Visit
Admission: RE | Admit: 2016-04-27 | Discharge: 2016-04-27 | Disposition: A | Discharge status: Home / Self Care | Payer: Medicaid Other | Source: Ambulatory Visit | Attending: Radiation Oncology | Admitting: Radiation Oncology

## 2016-04-27 DIAGNOSIS — Z510 Encounter for antineoplastic radiation therapy: Secondary | ICD-10-CM | POA: Diagnosis not present

## 2016-04-28 ENCOUNTER — Ambulatory Visit: Admission: RE | Admit: 2016-04-28 | Payer: Medicaid Other | Source: Ambulatory Visit

## 2016-04-28 ENCOUNTER — Ambulatory Visit
Admission: RE | Admit: 2016-04-28 | Discharge: 2016-04-28 | Disposition: A | Discharge status: Home / Self Care | Payer: Medicaid Other | Source: Ambulatory Visit | Attending: Radiation Oncology | Admitting: Radiation Oncology

## 2016-04-29 ENCOUNTER — Ambulatory Visit
Admission: RE | Admit: 2016-04-29 | Discharge: 2016-04-29 | Disposition: A | Discharge status: Home / Self Care | Payer: Medicaid Other | Source: Ambulatory Visit | Attending: Radiation Oncology | Admitting: Radiation Oncology

## 2016-04-29 ENCOUNTER — Telehealth: Payer: Self-pay | Admitting: *Deleted

## 2016-04-29 DIAGNOSIS — Z510 Encounter for antineoplastic radiation therapy: Secondary | ICD-10-CM | POA: Diagnosis not present

## 2016-04-29 NOTE — Telephone Encounter (Signed)
Patient left voice message that her schedule times are being changed and she wasn't told of the change,she comes at her lunch time as to not miss work,, she also wants to kno if Dr. Lisbeth Renshaw can double her last treatment Friday, will let MD know 9:43 AM

## 2016-04-30 ENCOUNTER — Ambulatory Visit
Admission: RE | Admit: 2016-04-30 | Discharge: 2016-04-30 | Disposition: A | Discharge status: Home / Self Care | Payer: Medicaid Other | Source: Ambulatory Visit | Attending: Radiation Oncology | Admitting: Radiation Oncology

## 2016-04-30 DIAGNOSIS — Z510 Encounter for antineoplastic radiation therapy: Secondary | ICD-10-CM | POA: Diagnosis not present

## 2016-05-01 ENCOUNTER — Ambulatory Visit: Payer: Medicaid Other

## 2016-05-01 ENCOUNTER — Ambulatory Visit
Admission: RE | Admit: 2016-05-01 | Discharge: 2016-05-01 | Disposition: A | Discharge status: Home / Self Care | Payer: Medicaid Other | Source: Ambulatory Visit | Attending: Radiation Oncology | Admitting: Radiation Oncology

## 2016-05-01 ENCOUNTER — Encounter: Payer: Self-pay | Admitting: Radiation Oncology

## 2016-05-01 VITALS — BP 150/90 | HR 78 | Temp 98.3°F | Resp 16 | Wt 196.0 lb

## 2016-05-01 DIAGNOSIS — Z510 Encounter for antineoplastic radiation therapy: Secondary | ICD-10-CM | POA: Diagnosis not present

## 2016-05-01 DIAGNOSIS — C50512 Malignant neoplasm of lower-outer quadrant of left female breast: Secondary | ICD-10-CM

## 2016-05-01 MED ORDER — RADIAPLEXRX EX GEL
Freq: Once | CUTANEOUS | Status: AC
  Administered 2016-05-01: 09:00:00 via TOPICAL

## 2016-05-01 NOTE — Progress Notes (Signed)
Weekly rad txs left breast, hyperpigmentation, dry desquamation under axilla and some of breast,  Using radiaplex bid, and cortisone cream for itching, occasional  Sharp pains in breast 8:25 AM BP 150/90 mmHg  Pulse 78  Temp(Src) 98.3 F (36.8 C) (Oral)  Resp 16  Wt 196 lb (88.905 kg)  LMP 04/16/2015 Wt Readings from Last 3 Encounters:  05/01/16 196 lb (88.905 kg)  04/24/16 198 lb 8 oz (90.039 kg)  04/17/16 195 lb (88.451 kg)

## 2016-05-01 NOTE — Progress Notes (Signed)
Department of Radiation Oncology  Phone:  [phone] Fax:        [phone]  Weekly Treatment Note    Name: Kristen Snyder Date: 05/02/2016 MRN: [id number] DOB: 04/26/1966   Diagnosis:     ICD-9-CM ICD-10-CM   1. Breast cancer of lower-outer quadrant of left female breast (HCC) 174.5 C50.512 hyaluronate sodium (RADIAPLEXRX) gel     Current dose: 50.4 Gy  Current fraction: 28   MEDICATIONS: Current Outpatient Prescriptions  Medication Sig Dispense Refill  . ACCU-CHEK AVIVA PLUS test strip   0  . aspirin EC 81 MG tablet Take 1 tablet (81 mg total) by mouth daily. 30 tablet 2  . carvedilol (COREG) 12.5 MG tablet Take 1 tablet (12.5 mg total) by mouth 2 (two) times daily with a meal. 270 tablet 3  . hyaluronate sodium (RADIAPLEXRX) GEL Apply 1 application topically 2 (two) times daily. Another tube given 05/01/16    . LANTUS SOLOSTAR 100 UNIT/ML Solostar Pen Inject 15 Units into the skin at bedtime.  0  . LEVEMIR FLEXTOUCH 100 UNIT/ML Pen   0  . lisinopril (PRINIVIL,ZESTRIL) 40 MG tablet Take 40 mg by mouth daily.  0  . Multiple Vitamin (MULTIVITAMIN) tablet Take 1 tablet by mouth daily.    . non-metallic deodorant Jethro Poling) MISC Apply 1 application topically daily as needed.    Marland Kitchen spironolactone (ALDACTONE) 25 MG tablet Take 1 tablet (25 mg total) by mouth daily. 90 tablet 3   No current facility-administered medications for this encounter.     ALLERGIES: Bidil   LABORATORY DATA:  Lab Results  Component Value Date   WBC 6.5 10/19/2014   HGB 10.8* 10/19/2014   HCT 35.1* 10/19/2014   MCV 62.3* 10/19/2014   PLT 403* 10/19/2014   Lab Results  Component Value Date   NA 136 02/12/2016   K 4.4 02/12/2016   CL 101 02/12/2016   CO2 24 02/12/2016   Lab Results  Component Value Date   ALT 52* 10/19/2014   AST 35 10/19/2014   ALKPHOS 83 10/19/2014   BILITOT 0.4 10/19/2014     NARRATIVE: Kristen Snyder was seen today for weekly treatment management. The  chart was checked and the patient's films were reviewed.  Weekly rad txs left breast. She is using radiaplex bid and hydrocortisone cream for itching (which helps). She has occasional sharp pains in breast.  10:00 PM BP 150/90 mmHg  Pulse 78  Temp(Src) 98.3 F (36.8 C) (Oral)  Resp 16  Wt 196 lb (88.905 kg)  LMP 04/16/2015  Wt Readings from Last 3 Encounters:  05/01/16 196 lb (88.905 kg)  04/24/16 198 lb 8 oz (90.039 kg)  04/17/16 195 lb (88.451 kg)    PHYSICAL EXAMINATION: weight is 196 lb (88.905 kg). Her oral temperature is 98.3 F (36.8 C). Her blood pressure is 150/90 and her pulse is 78. Her respiration is 16.  Some hyperpigmentation, but no dry desquamation of the left breast.  ASSESSMENT: The patient is doing satisfactorily with treatment.  PLAN: We will continue with the patient's radiation treatment as planned. If the patient's pruritus worsens, then she should notify us and we could switch to Sonafine. The patient is undergoing a couple of days of BID treatment this week.  ------------------------------------------------  Jodelle Gross, MD, PhD  This document serves as a record of services personally performed by Shona Simpson, PA-C and Kyung Rudd, MD. It was created on their behalf by Darcus Austin, a trained medical scribe. The creation  of this record is based on the scribe's personal observations and the providers' statements to them. This document has been checked and approved by the attending provider.

## 2016-05-04 ENCOUNTER — Ambulatory Visit: Payer: Medicaid Other

## 2016-05-04 ENCOUNTER — Ambulatory Visit
Admission: RE | Admit: 2016-05-04 | Discharge: 2016-05-04 | Disposition: A | Discharge status: Home / Self Care | Payer: Medicaid Other | Source: Ambulatory Visit | Attending: Radiation Oncology | Admitting: Radiation Oncology

## 2016-05-04 DIAGNOSIS — Z510 Encounter for antineoplastic radiation therapy: Secondary | ICD-10-CM | POA: Diagnosis not present

## 2016-05-05 ENCOUNTER — Ambulatory Visit
Admission: RE | Admit: 2016-05-05 | Discharge: 2016-05-05 | Disposition: A | Discharge status: Home / Self Care | Payer: Medicaid Other | Source: Ambulatory Visit | Attending: Radiation Oncology | Admitting: Radiation Oncology

## 2016-05-05 ENCOUNTER — Ambulatory Visit: Payer: Medicaid Other

## 2016-05-05 DIAGNOSIS — Z510 Encounter for antineoplastic radiation therapy: Secondary | ICD-10-CM | POA: Diagnosis not present

## 2016-05-06 ENCOUNTER — Ambulatory Visit: Payer: Medicaid Other

## 2016-05-06 ENCOUNTER — Ambulatory Visit
Admission: RE | Admit: 2016-05-06 | Discharge: 2016-05-06 | Disposition: A | Discharge status: Home / Self Care | Payer: Medicaid Other | Source: Ambulatory Visit | Attending: Radiation Oncology | Admitting: Radiation Oncology

## 2016-05-06 DIAGNOSIS — Z510 Encounter for antineoplastic radiation therapy: Secondary | ICD-10-CM | POA: Diagnosis not present

## 2016-05-07 ENCOUNTER — Encounter: Payer: Self-pay | Admitting: Radiation Oncology

## 2016-05-07 ENCOUNTER — Ambulatory Visit: Payer: Medicaid Other

## 2016-05-07 ENCOUNTER — Ambulatory Visit
Admission: RE | Admit: 2016-05-07 | Discharge: 2016-05-07 | Disposition: A | Discharge status: Home / Self Care | Payer: Medicaid Other | Source: Ambulatory Visit | Attending: Radiation Oncology | Admitting: Radiation Oncology

## 2016-05-07 DIAGNOSIS — Z510 Encounter for antineoplastic radiation therapy: Secondary | ICD-10-CM | POA: Diagnosis not present

## 2016-05-07 NOTE — Progress Notes (Signed)
Patient had to leave before seeing an MD.  She had to return to work by 2:30.  She said that she could come back another day if needed.

## 2016-05-07 NOTE — Progress Notes (Signed)
Kristen Snyder has finished treatment to her left breast with 33 fractions.  She denies having pain and fatigue.  She is using radiaplex gel and hydrocortisone cream for itching.  The skin on her left breast has hyperpigmentation with healed peeling areas under her breast and underarm.  She has been given a one month follow up appointment.    BP 137/87 mmHg  Pulse 87  Temp(Src) 98.1 F (36.7 C) (Oral)  Ht 5\' 4"  (1.626 m)  Wt 193 lb 11.2 oz (87.862 kg)  BMI 33.23 kg/m2  SpO2 98%  LMP 04/16/2015   Wt Readings from Last 3 Encounters:  05/07/16 193 lb 11.2 oz (87.862 kg)  05/01/16 196 lb (88.905 kg)  04/24/16 198 lb 8 oz (90.039 kg)

## 2016-05-08 ENCOUNTER — Telehealth: Payer: Self-pay | Admitting: *Deleted

## 2016-05-08 ENCOUNTER — Ambulatory Visit: Payer: Medicaid Other

## 2016-05-08 NOTE — Telephone Encounter (Signed)
Left vm for pt to return call to assess needs after xrt and to discuss survivorship program. Contact information provided.

## 2016-05-08 NOTE — Telephone Encounter (Signed)
  Oncology Nurse Navigator Documentation    Navigator Encounter Type: Telephone (05/08/16 1100) Telephone: Incoming Call (05/08/16 1100)  Congratulate on completion of xrt. Relate doing well and without complaints Discussed survivorship program and referral. Denies further needs at this time.                                      Time Spent with Patient: 15 (05/08/16 1100)

## 2016-05-11 NOTE — Progress Notes (Signed)
  Radiation Oncology         [phone] ________________________________  Name: Kristen Snyder MRN: [id number]  Date: 05/07/2016  DOB: 08/02/1966  End of Treatment Note  Diagnosis:   Left-sided breast cancer     Indication for treatment:  Curative       Radiation treatment dates:   03/23/2016 through 05/07/2016  Site/dose:   The patient initially received a dose of 50.4 Gy in 28 fractions to the breast using whole-breast tangent fields. This was delivered using a 3-D conformal technique. The patient then received a boost to the seroma. This delivered an additional 10 Gy in 5 fractions using a 3 field photon technique. The total dose was 60.4 Gy.  Narrative: The patient tolerated radiation treatment relatively well.   The patient had some expected skin irritation as she progressed during treatment. Moist desquamation was not present at the end of treatment.  Plan: The patient has completed radiation treatment. The patient will return to radiation oncology clinic for routine followup in one month. I advised the patient to call or return sooner if they have any questions or concerns related to their recovery or treatment. ________________________________  Jodelle Gross, M.D., Ph.D.

## 2016-05-11 NOTE — Progress Notes (Signed)
Complex simulation note  Diagnosis: Left-sided breast cancer  Narrative The patient has initially been planned to receive a course of whole breast radiation to a dose of 50.4 Gy in 28 fractions. The patient will now receive an additional boost to the seroma cavity which has been contoured. This will correspond to a boost of 10 Gy at 2 Gy per fraction. To accomplish this, an additional 3 customized blocks have been designed for this purpose. A complex isodose plan is requested to ensure that the target area is adequately covered with radiation dose and that the nearby normal structures such as the lung are adequately spared. The patient's final total dose will be 60.4 Gy.  ------------------------------------------------  Adrienna Karis S. Longino Trefz, MD, PhD   

## 2016-05-14 ENCOUNTER — Ambulatory Visit: Payer: Medicaid Other | Admitting: Hematology

## 2016-05-14 ENCOUNTER — Ambulatory Visit: Payer: Medicaid Other | Admitting: Hematology and Oncology

## 2016-05-15 ENCOUNTER — Encounter: Payer: Self-pay | Admitting: Hematology

## 2016-05-15 ENCOUNTER — Ambulatory Visit (HOSPITAL_BASED_OUTPATIENT_CLINIC_OR_DEPARTMENT_OTHER): Payer: Medicaid Other | Admitting: Hematology

## 2016-05-15 VITALS — BP 118/72 | HR 85 | Temp 98.5°F | Resp 17 | Ht 64.0 in | Wt 194.7 lb

## 2016-05-15 DIAGNOSIS — D0511 Intraductal carcinoma in situ of right breast: Secondary | ICD-10-CM | POA: Diagnosis present

## 2016-05-15 DIAGNOSIS — C50512 Malignant neoplasm of lower-outer quadrant of left female breast: Secondary | ICD-10-CM

## 2016-05-15 DIAGNOSIS — Z803 Family history of malignant neoplasm of breast: Secondary | ICD-10-CM

## 2016-05-15 DIAGNOSIS — Z170 Estrogen receptor positive status [ER+]: Secondary | ICD-10-CM | POA: Diagnosis not present

## 2016-05-15 NOTE — Progress Notes (Signed)
[place]  [phone]) [phone] Fax:([phone]) [phone]  Clinic New Consult Note   Patient Care Team: Lin Landsman, MD as PCP - General (Family Medicine) 05/15/2016  Referring physician: Dr. Excell Seltzer  CHIEF COMPLAINTS/PURPOSE OF CONSULTATION:  Left breast cancer DCIS   Oncology History   Breast cancer of lower-outer quadrant of left female breast [facility])   Staging form: Breast, AJCC 7th Edition     Clinical: Stage 0 (Tis (DCIS), N0, M0) - Unsigned     Pathologic stage from 02/17/2016: Stage 0 (Tis (DCIS), N0, cM0) - Signed by Truitt Merle, MD on 03/08/2016       Breast cancer of lower-outer quadrant of left female breast ([place])   09/12/2015 Mammogram Screening mammogram showed calcification in left breast    10/09/2015 Mammogram diagnostoc mammo and US showed 3.0cm  loosely grouped calcification in the lower inner left breast are suspicious    10/23/2015 Initial Diagnosis Breast cancer of lower-outer quadrant of left female breast ([place])   10/23/2015 Initial Biopsy left breast biopsy showed DCIS and comedonecrosis    11/13/2015 Imaging Breast MRI showed known masslike enhancement in the left breast in 2 locations, measuring 4.3 x 1.6 x 1.4 cm, and 2.6 x 1.0 x 1.0 cm. Normal appearance of the right breast. No evidence of adenopathy.   02/17/2016 Surgery left breast lumpectomy   02/17/2016 Pathology Results left breast lumpectomy showed low grade DCIS, less than 0.1cm, (+) ALH, DCIS focally 0.1cm to the inferior margin   03/23/2016 - 05/07/2016 Radiation Therapy Left breast radiation    HISTORY OF PRESENTING ILLNESS:  Kristen Snyder 50 y.o. female is here because of Her recently diagnosed left breast cancer. She presents to the clinic by herself.  This was discovered by screening mammogram in October 2016. The diagnostic mammogram and ultrasound showed a 3 cm grouped calcifications in the lower inner left breast, MRI of the breast showed no mass like enhancement in the left breast  in 2 locations, measuring 4.3 cm and 2.6 cm. She underwent needle biopsy twice, one showed DCIS, the second one showed a typical ductal hyperplasia. She underwent left breast lumpectomy on 02/17/2016, tolerated well. She has recently completed adjuvant breast radiation one week ago. She tolerated radiation very well also.  She feels well, denies any significant pain, or other discomfort. She is physically active, has mild arthralgia from arthritis, no other complaints. Her menstrual period has been irregular in the past year, less frequent than before, her last period was a month ago.   MEDICAL HISTORY:  Past Medical History  Diagnosis Date  . Anxiety   . Hypertension     states under control with meds., has been on med. at least 59 yr.  . History of congestive heart failure 10/2014  . Nonischemic cardiomyopathy ([place])   . Insulin dependent diabetes mellitus ([place])   . 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 Dental crown present     SURGICAL HISTORY: Past Surgical History  Procedure Laterality Date  . Left and right heart catheterization with coronary angiogram [name] 10/08/2014    Procedure: LEFT AND RIGHT HEART CATHETERIZATION WITH CORONARY ANGIOGRAM;  Surgeon: Jettie Booze, MD;  Location: [facility] CATH LAB;  Service: Cardiovascular;  Laterality: [name];  . Knee arthroscopy with anterior cruciate ligament (acl) repair Right   . Shoulder arthroscopy w/ rotator cuff repair Right   . Breast lumpectomy with needle localization Left 02/17/2016  Procedure: LEFT BREAST LUMPECTOMY WITH BRACKETED NEEDLE LOCALIZATION;  Surgeon: Excell Seltzer, MD;  Location: [place];  Service: General;  Laterality: Left;    SOCIAL HISTORY: Social History   Social History  . Marital Status: Married    Spouse Name: [name]  . Number of Children: 1  . Years of Education: [name]   Occupational History  .      [employer]    Social History Main Topics  . Smoking status: Never Smoker   . Smokeless tobacco: Never Used  . Alcohol Use: 0.0 oz/week    0 Standard drinks or equivalent per week     Comment: occasionally  . Drug Use: No  . Sexual Activity: Yes   Other Topics Concern  . Not on file   Social History Narrative    FAMILY HISTORY: Family History  Problem Relation Age of Onset  . Heart disease      No family history  . Diabetes Mother   . Hypertension Mother   . Cancer Mother     colon  . Hypertension Father   . Diabetes Father   . Cancer Father     prostate    ALLERGIES:  is allergic to bidil.  MEDICATIONS:  Current Outpatient Prescriptions  Medication Sig Dispense Refill  . ACCU-CHEK AVIVA PLUS test strip   0  . aspirin EC 81 MG tablet Take 1 tablet (81 mg total) by mouth daily. 30 tablet 2  . carvedilol (COREG) 12.5 MG tablet Take 1 tablet (12.5 mg total) by mouth 2 (two) times daily with a meal. 270 tablet 3  . hyaluronate sodium (RADIAPLEXRX) GEL Apply 1 application topically 2 (two) times daily. Another tube given 05/01/16    . LANTUS SOLOSTAR 100 UNIT/ML Solostar Pen Inject 15 Units into the skin at bedtime.  0  . LEVEMIR FLEXTOUCH 100 UNIT/ML Pen   0  . lisinopril (PRINIVIL,ZESTRIL) 40 MG tablet Take 40 mg by mouth daily.  0  . Multiple Vitamin (MULTIVITAMIN) tablet Take 1 tablet by mouth daily.    . non-metallic deodorant Jethro Poling) MISC Apply 1 application topically daily as needed.    Marland Kitchen spironolactone (ALDACTONE) 25 MG tablet Take 1 tablet (25 mg total) by mouth daily. 90 tablet 3   No current facility-administered medications for this visit.    REVIEW OF SYSTEMS:   Constitutional: Denies fevers, chills or abnormal night sweats Eyes: Denies blurriness of vision, double vision or watery eyes Ears, nose, mouth, throat, and face: Denies mucositis or sore throat Respiratory: Denies cough, dyspnea or wheezes Cardiovascular: Denies palpitation, chest discomfort or lower  extremity swelling Gastrointestinal:  Denies nausea, heartburn or change in bowel habits Skin: Denies abnormal skin rashes Lymphatics: Denies new lymphadenopathy or easy bruising Neurological:Denies numbness, tingling or new weaknesses Behavioral/Psych: Mood is stable, no new changes  All other systems were reviewed with the patient and are negative.  PHYSICAL EXAMINATION: ECOG PERFORMANCE STATUS: 0 - Asymptomatic  Filed Vitals:   05/15/16 1452  BP: 118/72  Pulse: 85  Temp: 98.5 F (36.9 C)  Resp: 17   Filed Weights   05/15/16 1452  Weight: 194 lb 11.2 oz (88.315 kg)    GENERAL:alert, no distress and comfortable SKIN: skin color, texture, turgor are normal, no rashes or significant lesions EYES: normal, conjunctiva are pink and non-injected, sclera clear OROPHARYNX:no exudate, no erythema and lips, buccal mucosa, and tongue normal  NECK: supple, thyroid normal size, non-tender, without nodularity LYMPH:  no palpable lymphadenopathy in the  cervical, axillary or inguinal LUNGS: clear to auscultation and percussion with normal breathing effort HEART: regular rate & rhythm and no murmurs and no lower extremity edema ABDOMEN:abdomen soft, non-tender and normal bowel sounds Musculoskeletal:no cyanosis of digits and no clubbing  PSYCH: alert & oriented x 3 with fluent speech NEURO: no focal motor/sensory deficits Breasts: Breast inspection showed them to be symmetrical with no nipple discharge. (+) Diffuse skin pigmentation of the left breast from radiation, healed skin peeling. Palpation of the breasts and axilla revealed no obvious mass that I could appreciate.   LABORATORY DATA:  I have reviewed the data as listed CBC Latest Ref Rng 10/19/2014 10/08/2014  WBC 4.0 - 10.5 K/uL 6.5 4.7  Hemoglobin 12.0 - 15.0 g/dL 10.8(L) 11.0(L)  Hematocrit 36.0 - 46.0 % 35.1(L) 35.5(L)  Platelets 150 - 400 K/uL 403(H) 438(H)   CMP Latest Ref Rng 02/12/2016 12/19/2015 10/16/2015  Glucose 65 - 99  mg/dL 385(H) 184(H) 211(H)  BUN 6 - 20 mg/dL 8 5(L) 12  Creatinine 0.44 - 1.00 mg/dL 0.82 0.66 0.71  Sodium 135 - 145 mmol/L 136 139 136  Potassium 3.5 - 5.1 mmol/L 4.4 4.5 4.3  Chloride 101 - 111 mmol/L 101 103 101  CO2 22 - 32 mmol/L 24 27 24   Calcium 8.9 - 10.3 mg/dL 9.2 8.9 9.4   PATHOLOGY REPORT  Diagnosis 02/17/2016 1. Breast, lumpectomy, Left Breast - DUCTAL CARCINOMA IN SITU, LOW GRADE, SPANNING LESS THAN 0.1 CM. - DUCTAL CARCINOMA IN SITU IS FOCALLY 0.1 CM TO THE INFERIOR MARGIN OF SPECIMEN #1. - SEE ONCOLOGY TABLE BELOW. 2. Breast, excision, Left Superior Margin - LOBULAR NEOPLASIA (ATYPICAL LOBULAR HYPERPLASIA). - SEE COMMENT. 3. Breast, excision, Left Lateral Margin - BENIGN BREAST PARENCHYMA. - THERE IS NO EVIDENCE OF MALIGNANCY. - SEE COMMENT. Microscopic Comment 1. BREAST, IN SITU CARCINOMA Specimen, including laterality: Left breast. Procedure (include lymph node sampling sentinel-non-sentinel): Lumpectomy and additional superior and lateral margin resections. Grade of carcinoma: Low grade. Necrosis: Not identified. Estimated tumor size: (glass slide measurement): Less than 0.1 cm. Treatment effect: [name]. Distance to closest margin: Focally 0.1 cm to the inferior margin of specimen #1. Breast prognostic profile: [id number]. Estrogen receptor: 100%, strong staining intensity. Progesterone receptor: 5%, strong staining intensity. Lymph nodes: None examined. TNM: pTis, pNX.  Diagnosis 10/22/2016 Breast, left, needle core biopsy, lower inner quadrant - DUCTAL CARCINOMA IN SITU WITH CALCIFICATIONS AND COMEDONECROSIS. - SEE COMMENT.  As sampled, the ductal carcinoma in situ is intermediate to high grade. A quantitative estrogen receptor and progesterone receptor will be performed and reported in an addendum to follow. The findings are called to the [place] on 10/24/15. Dr. Saralyn Pilar has seen this case in consultation with agreement. (RAH:gt,  10/24/15) Results: IMMUNOHISTOCHEMICAL AND MORPHOMETRIC ANALYSIS PERFORMED MANUALLY Estrogen Receptor: 100%, POSITIVE, STRONG STAINING INTENSITY Progesterone Receptor: 5%, POSITIVE, STRONG STAINING INTENSITY  Diagnosis 12/25/2015 Breast, left, needle core biopsy, lower inner quadrant, mid breast - ATYPICAL DUCTAL HYPERPLASIA WITH CALCIFICATIONS.   RADIOGRAPHIC STUDIES: I have personally reviewed the radiological images as listed and agreed with the findings in the report.  MR breast b/l w wo contrast 11/13/2015 IMPRESSION: 1. Abnormal non masslike enhancement in the left breast is seen in 2 locations consistent with the previous to biopsy locations. The most extensive area is in the middle third of the left breast, lower outer quadrant, covering 4.3 x 1.6 x 1.4 cm. The smaller area is along the far posterior lower inner quadrant covering 2.6 x 1.0 x 1.0  cm. 2. No other abnormal enhancement is seen to suggest additional areas of atypia or malignancy. 3. Normal appearance of the right breast. No evidence of adenopathy.  RECOMMENDATION: 1. Surgical excision of the area of DCIS central lower outer aspect of the left breast. 2. Surgical excision should be considered for the additional area of non mass enhancement along the posterior lower inner quadrant of the left breast, where the previous biopsy demonstrated atypical lobular Hyperplasia.  Diagnostic mammogram left 10/09/2015 IMPRESSION: The 3.0 cm loosely grouped fine punctate calcifications in the lower inner left breast are suspicious, and warrant biopsy.  RECOMMENDATION: Stereotactic biopsy is recommended for the left breast calcifications.    ASSESSMENT & PLAN:  50 year old perimenopausal woman, presented with screening discovered DCIS.  1. Breast cancer of lower-outer outer quadrant of left female breast, DCIS, intermediate to high grade, ER+ /PR+ -I discussed her breast imaging, needle biopsy and surgical path  results with patient in great detail. -She is s/p left breast lumpectomy and adjuvant radiation. -Given her family history of breast cancer and her young age,  we recommend her to undergo genetic testing to ruled out inheritable breast cancer, she will think about it, she is concerned about her insurance coverage for genetic testing. -Her DCIS has been cured by complete surgical resection. Any form of adjuvant therapy is preventive. -Given her strongly positive ER, I recommend her to consider antiestrogen therapy with tamoxifen, which decrease her risk of future breast cancer by ~40%.  -We used the DCIS nomogram from [facility] to cataract. Her risk of secondary breast cancer with and without postoperative radiation and antiestrogen therapy. I gave her the report  -Her risk of secondary breast cancer has significantly reduced after breast radiation, additional tamoxifen for 5 years will further decrease her absolute risk of breast cancer by 5% in the next 10 years, however there is no survival benefit from tamoxifen. Given the potential side effects, especially endometrial cancer, and hot flash, patient is reluctant to take tamoxifen. -We discussed breast cancer surveillance after she completes treatment, Including annual mammogram, breast exam every 6-12 months, and self exam.   Plan -She will think about tamoxifen, and call with me with her decision.  -If she decides not to take tamoxifen, she will likely follow-up with her PCP or gynecologist for breast cancer screening.   All questions were answered. The patient knows to call the clinic with any problems, questions or concerns. I spent 55 minutes counseling the patient face to face. The total time spent in the appointment was 60 minutes and more than 50% was on counseling.     Truitt Merle, MD 05/15/2016 2:59 PM

## 2016-05-16 ENCOUNTER — Encounter: Payer: Self-pay | Admitting: Hematology

## 2016-05-28 ENCOUNTER — Other Ambulatory Visit: Payer: Self-pay | Admitting: *Deleted

## 2016-05-28 ENCOUNTER — Telehealth: Payer: Self-pay | Admitting: *Deleted

## 2016-05-28 DIAGNOSIS — C50512 Malignant neoplasm of lower-outer quadrant of left female breast: Secondary | ICD-10-CM

## 2016-05-28 MED ORDER — TAMOXIFEN CITRATE 20 MG PO TABS: 20.0000 mg | ORAL_TABLET | Freq: Every day | ORAL | Status: DC

## 2016-05-28 NOTE — Telephone Encounter (Signed)
Received call from pt stating that she saw Dr Burr Medico recently & discussed tamoxifen & she has decided to try.  She had questions about birth control & read that she needs to use.  Discussed condoms & pt reports that she hasn't been using birth control but had a period in May after a long period without one.  Encouraged use of condom & informed that she doesn't want to get pregnant on this drug.  She expressed understanding.  Discussed with Dr Burr Medico & OK to send in script to [facility]/[place] for 20 mg daily & return in 2 months to see her.  POF placed.

## 2016-06-02 ENCOUNTER — Telehealth: Payer: Self-pay | Admitting: Hematology

## 2016-06-02 NOTE — Telephone Encounter (Signed)
Spoke with patient. Appointment confirmed for 07/23/16. Kristen Snyder.

## 2016-06-05 ENCOUNTER — Other Ambulatory Visit: Payer: Self-pay | Admitting: *Deleted

## 2016-06-05 DIAGNOSIS — C50512 Malignant neoplasm of lower-outer quadrant of left female breast: Secondary | ICD-10-CM

## 2016-06-08 ENCOUNTER — Telehealth: Payer: Self-pay | Admitting: Adult Health

## 2016-06-08 NOTE — Telephone Encounter (Signed)
Appointment confirmed with patient. Kristen Snyder °

## 2016-06-09 ENCOUNTER — Ambulatory Visit
Admission: RE | Admit: 2016-06-09 | Discharge: 2016-06-09 | Disposition: A | Discharge status: Home / Self Care | Payer: Medicaid Other | Source: Ambulatory Visit | Attending: Radiation Oncology | Admitting: Radiation Oncology

## 2016-06-09 DIAGNOSIS — C50512 Malignant neoplasm of lower-outer quadrant of left female breast: Secondary | ICD-10-CM

## 2016-06-09 DIAGNOSIS — Z170 Estrogen receptor positive status [ER+]: Secondary | ICD-10-CM | POA: Insufficient documentation

## 2016-06-09 DIAGNOSIS — D0512 Intraductal carcinoma in situ of left breast: Secondary | ICD-10-CM | POA: Insufficient documentation

## 2016-06-09 NOTE — Progress Notes (Signed)
Kristen Snyder here for reassessment s/p XRT to her left breast.  Note regressing hyperpigmentation of her left breast with intact skin. She denies any pain nor discomfort at this time.  Denies any fatigue.  As of 2 weeks ago started taking Tamoxifen and has not noted any increasing episodes of hotflashes.

## 2016-06-09 NOTE — Progress Notes (Signed)
  Radiation Oncology         [phone] ________________________________  Name: Kristen Snyder MRN: [id number]  Date: 06/09/2016  DOB: 12-Feb-1966  Follow-Up Visit Note  CC: Kristen Garbe, MD  Kristen Seltzer, MD  Diagnosis:  ER/PR positive DCIS of the left breast.  Interval Since Last Radiation:  4 weeks  03/23/2016 through 05/07/2016: The patient initially received a dose of 50.4 Gy in 28 fractions to the breast using whole-breast tangent fields. This was delivered using a 3-D conformal technique. The patient then received a boost to the seroma. This delivered an additional 10 Gy in 5 fractions using a 3 field photon technique. The total dose was 60.4 Gy.  Narrative:  The patient returns today for routine follow-up.  She did well and tolerating her radiotherapy without significant toxicity. She began tamoxifen about 2 weeks ago.                             On review of systems, the patient states she is doing well overall.she started her tamoxifen has noticed a few extra hot flashes from today.she denies any difficulty with chest pain, shortness of breath, fevers or chills. No other complaints or verbalized.   ALLERGIES:  is allergic to bidil [isosorb dinitrate-hydralazine].  Meds: Current Outpatient Prescriptions  Medication Sig Dispense Refill  . ACCU-CHEK AVIVA PLUS test strip   0  . aspirin EC 81 MG tablet Take 1 tablet (81 mg total) by mouth daily. 30 tablet 2  . carvedilol (COREG) 12.5 MG tablet Take 1 tablet (12.5 mg total) by mouth 2 (two) times daily with a meal. 270 tablet 3  . hyaluronate sodium (RADIAPLEXRX) GEL Apply 1 application topically 2 (two) times daily. Another tube given 05/01/16    . LANTUS SOLOSTAR 100 UNIT/ML Solostar Pen Inject 80 Units into the skin at bedtime.   0  . lisinopril (PRINIVIL,ZESTRIL) 40 MG tablet Take 40 mg by mouth daily.  0  . Multiple Vitamin (MULTIVITAMIN) tablet Take 1 tablet by mouth daily.    Marland Kitchen spironolactone (ALDACTONE) 25 MG  tablet Take 1 tablet (25 mg total) by mouth daily. 90 tablet 3  . tamoxifen (NOLVADEX) 20 MG tablet Take 1 tablet (20 mg total) by mouth daily. 30 tablet 2   No current facility-administered medications for this encounter.     Physical Findings:  vitals were not taken for this visit. In general this is a well appearing African American female in no acute distress. She's alert and oriented x4 and appropriate throughout the examination. Cardiopulmonary assessment is negative for acute distress and she exhibits normal effort. The left breast is assessed and the skin is intact with minimal hyperpigmentation along the midaxillary line no desquamation is present.    Lab Findings: Lab Results  Component Value Date   WBC 6.5 10/19/2014   HGB 10.8 (L) 10/19/2014   HCT 35.1 (L) 10/19/2014   MCV 62.3 (L) 10/19/2014   PLT 403 (H) 10/19/2014     Radiographic Findings: No results found.  Impression/Plan: 1. ER/PR positive DCIS of the left breast. The patient has tolerated radiotherapy well and will continue under the care of Dr. Burr Medico for her antiestrogen therapy with tamoxifen. 2. Survivorship. The patient has been in contact with Kristen Craze, NP and will continue to follow upin that setting for her survivorship care.    Carola Rhine, PAC

## 2016-06-11 ENCOUNTER — Other Ambulatory Visit: Payer: Self-pay | Admitting: Cardiology

## 2016-06-11 DIAGNOSIS — I5023 Acute on chronic systolic (congestive) heart failure: Secondary | ICD-10-CM

## 2016-06-11 NOTE — Telephone Encounter (Signed)
REFILL 

## 2016-06-15 ENCOUNTER — Other Ambulatory Visit: Payer: Self-pay | Admitting: Cardiology

## 2016-06-15 NOTE — Telephone Encounter (Signed)
Rx(s) sent to pharmacy electronically.  

## 2016-07-08 ENCOUNTER — Telehealth: Payer: Self-pay

## 2016-07-08 NOTE — Telephone Encounter (Signed)
Pt called to r/s her appt. She has been discussing with Dr Burr Medico she is starting a new job and won't be available for current appt schedule. She is available 8/24, 8/25 or 8/28 at any time. Her new job starts Tuesday. In basket sent to scheduler with these dates or else to speak with Dr Burr Medico for alternatives.

## 2016-07-09 ENCOUNTER — Ambulatory Visit: Payer: Medicaid Other | Admitting: Hematology

## 2016-07-09 ENCOUNTER — Other Ambulatory Visit: Payer: Medicaid Other

## 2016-07-09 NOTE — Progress Notes (Deleted)
[place]  [phone] Fax:([phone]) [phone]  Clinic New Consult Note   Patient Care Team: Lin Landsman, MD as PCP - General (Family Medicine) 07/09/2016  Referring physician: Dr. Excell Seltzer  CHIEF COMPLAINTS/PURPOSE OF CONSULTATION:  Left breast cancer DCIS   Oncology History   Breast cancer of lower-outer quadrant of left female breast [facility])   Staging form: Breast, AJCC 7th Edition     Clinical: Stage 0 (Tis (DCIS), N0, M0) - Unsigned     Pathologic stage from 02/17/2016: Stage 0 (Tis (DCIS), N0, cM0) - Signed by Truitt Merle, MD on 03/08/2016       Breast cancer of lower-outer quadrant of left female breast ([place])   09/12/2015 Mammogram    Screening mammogram showed calcification in left breast       10/09/2015 Mammogram    diagnostoc mammo and US showed 3.0cm  loosely grouped calcification in the lower inner left breast are suspicious       10/23/2015 Initial Diagnosis    Breast cancer of lower-outer quadrant of left female breast ([place])      10/23/2015 Initial Biopsy    left breast biopsy showed DCIS and comedonecrosis       11/13/2015 Imaging    Breast MRI showed known masslike enhancement in the left breast in 2 locations, measuring 4.3 x 1.6 x 1.4 cm, and 2.6 x 1.0 x 1.0 cm. Normal appearance of the right breast. No evidence of adenopathy.      02/17/2016 Surgery    left breast lumpectomy      02/17/2016 Pathology Results    left breast lumpectomy showed low grade DCIS, less than 0.1cm, (+) ALH, DCIS focally 0.1cm to the inferior margin      03/23/2016 - 05/07/2016 Radiation Therapy    Left breast radiation       HISTORY OF PRESENTING ILLNESS:  Kristen Snyder 50 y.o. female is here because of Her recently diagnosed left breast cancer. She presents to the clinic by herself.  This was discovered by screening mammogram in October 2016. The diagnostic mammogram and ultrasound showed a 3 cm grouped calcifications in the lower inner left  breast, MRI of the breast showed no mass like enhancement in the left breast in 2 locations, measuring 4.3 cm and 2.6 cm. She underwent needle biopsy twice, one showed DCIS, the second one showed a typical ductal hyperplasia. She underwent left breast lumpectomy on 02/17/2016, tolerated well. She has recently completed adjuvant breast radiation one week ago. She tolerated radiation very well also.  She feels well, denies any significant pain, or other discomfort. She is physically active, has mild arthralgia from arthritis, no other complaints. Her menstrual period has been irregular in the past year, less frequent than before, her last period was a month ago.   MEDICAL HISTORY:  Past Medical History:  Diagnosis Date  . Anxiety   . 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 Dental crown present   . 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 History of congestive heart failure 10/2014  . Hypertension    states under control with meds., has been on med. at least 52 yr.  . Insulin dependent diabetes mellitus ([place])   . Nonischemic cardiomyopathy ([place])     SURGICAL HISTORY: Past Surgical History:  Procedure Laterality Date  . BREAST LUMPECTOMY WITH NEEDLE LOCALIZATION Left 02/17/2016   Procedure: LEFT BREAST LUMPECTOMY WITH BRACKETED NEEDLE LOCALIZATION;  Surgeon: Excell Seltzer, MD;  Location: [facility];  Service: General;  Laterality: Left;  . KNEE ARTHROSCOPY WITH ANTERIOR CRUCIATE LIGAMENT (ACL) REPAIR Right   . LEFT AND RIGHT HEART CATHETERIZATION WITH CORONARY ANGIOGRAM [name] 10/08/2014   Procedure: LEFT AND RIGHT HEART CATHETERIZATION WITH CORONARY ANGIOGRAM;  Surgeon: Jettie Booze, MD;  Location: [facility] CATH LAB;  Service: Cardiovascular;  Laterality: [name];  . SHOULDER ARTHROSCOPY W/ ROTATOR CUFF REPAIR Right     SOCIAL HISTORY: Social History   Social History  . Marital status: Married    Spouse name: [name]  . Number of children: 1  .  Years of education: [name]   Occupational History  .      [employer]   Social History Main Topics  . Smoking status: Never Smoker  . Smokeless tobacco: Never Used  . Alcohol use 0.6 oz/week    1 Glasses of wine per week     Comment: occasionally  . Drug use: No  . Sexual activity: Yes   Other Topics Concern  . Not on file   Social History Narrative  . No narrative on file    FAMILY HISTORY: Family History  Problem Relation Age of Onset  . Heart disease      No family history  . Diabetes Mother   . Hypertension Mother   . Cancer Mother 88    colon  . Hypertension Father   . Diabetes Father   . Cancer Father 3    prostate cancer   . Cancer Paternal Aunt     breast cancer   . Cancer Paternal Aunt 75    ovarian cancer     ALLERGIES:  is allergic to bidil [isosorb dinitrate-hydralazine].  MEDICATIONS:  Current Outpatient Prescriptions  Medication Sig Dispense Refill  . ACCU-CHEK AVIVA PLUS test strip   0  . aspirin EC 81 MG tablet Take 1 tablet (81 mg total) by mouth daily. 30 tablet 2  . carvedilol (COREG) 12.5 MG tablet Take 1 tablet (12.5 mg total) by mouth 2 (two) times daily with a meal. 180 tablet 1  . hyaluronate sodium (RADIAPLEXRX) GEL Apply 1 application topically 2 (two) times daily. Another tube given 05/01/16    . LANTUS SOLOSTAR 100 UNIT/ML Solostar Pen Inject 80 Units into the skin at bedtime.   0  . lisinopril (PRINIVIL,ZESTRIL) 40 MG tablet Take 40 mg by mouth daily.  0  . Multiple Vitamin (MULTIVITAMIN) tablet Take 1 tablet by mouth daily.    Marland Kitchen spironolactone (ALDACTONE) 25 MG tablet Take 1 tablet (25 mg total) by mouth daily. NEED OV. 30 tablet 2  . tamoxifen (NOLVADEX) 20 MG tablet Take 1 tablet (20 mg total) by mouth daily. 30 tablet 2   No current facility-administered medications for this visit.     REVIEW OF SYSTEMS:   Constitutional: Denies fevers, chills or abnormal night sweats Eyes: Denies blurriness of vision, double vision or  watery eyes Ears, nose, mouth, throat, and face: Denies mucositis or sore throat Respiratory: Denies cough, dyspnea or wheezes Cardiovascular: Denies palpitation, chest discomfort or lower extremity swelling Gastrointestinal:  Denies nausea, heartburn or change in bowel habits Skin: Denies abnormal skin rashes Lymphatics: Denies new lymphadenopathy or easy bruising Neurological:Denies numbness, tingling or new weaknesses Behavioral/Psych: Mood is stable, no new changes  All other systems were reviewed with the patient and are negative.  PHYSICAL EXAMINATION: ECOG PERFORMANCE STATUS: 0 - Asymptomatic  There were no vitals filed for this visit. There were no vitals filed for this visit.  GENERAL:alert,  no distress and comfortable SKIN: skin color, texture, turgor are normal, no rashes or significant lesions EYES: normal, conjunctiva are pink and non-injected, sclera clear OROPHARYNX:no exudate, no erythema and lips, buccal mucosa, and tongue normal  NECK: supple, thyroid normal size, non-tender, without nodularity LYMPH:  no palpable lymphadenopathy in the cervical, axillary or inguinal LUNGS: clear to auscultation and percussion with normal breathing effort HEART: regular rate & rhythm and no murmurs and no lower extremity edema ABDOMEN:abdomen soft, non-tender and normal bowel sounds Musculoskeletal:no cyanosis of digits and no clubbing  PSYCH: alert & oriented x 3 with fluent speech NEURO: no focal motor/sensory deficits Breasts: Breast inspection showed them to be symmetrical with no nipple discharge. (+) Diffuse skin pigmentation of the left breast from radiation, healed skin peeling. Palpation of the breasts and axilla revealed no obvious mass that I could appreciate.   LABORATORY DATA:  I have reviewed the data as listed CBC Latest Ref Rng & Units 10/19/2014 10/08/2014  WBC 4.0 - 10.5 K/uL 6.5 4.7  Hemoglobin 12.0 - 15.0 g/dL 10.8(L) 11.0(L)  Hematocrit 36.0 - 46.0 % 35.1(L)  35.5(L)  Platelets 150 - 400 K/uL 403(H) 438(H)   CMP Latest Ref Rng & Units 02/12/2016 12/19/2015 10/16/2015  Glucose 65 - 99 mg/dL 385(H) 184(H) 211(H)  BUN 6 - 20 mg/dL 8 5(L) 12  Creatinine 0.44 - 1.00 mg/dL 0.82 0.66 0.71  Sodium 135 - 145 mmol/L 136 139 136  Potassium 3.5 - 5.1 mmol/L 4.4 4.5 4.3  Chloride 101 - 111 mmol/L 101 103 101  CO2 22 - 32 mmol/L 24 27 24   Calcium 8.9 - 10.3 mg/dL 9.2 8.9 9.4  Total Protein 6.0 - 8.3 g/dL - - -  Total Bilirubin 0.3 - 1.2 mg/dL - - -  Alkaline Phos 39 - 117 U/L - - -  AST 0 - 37 U/L - - -  ALT 0 - 35 U/L - - -   PATHOLOGY REPORT  Diagnosis 02/17/2016 1. Breast, lumpectomy, Left Breast - DUCTAL CARCINOMA IN SITU, LOW GRADE, SPANNING LESS THAN 0.1 CM. - DUCTAL CARCINOMA IN SITU IS FOCALLY 0.1 CM TO THE INFERIOR MARGIN OF SPECIMEN #1. - SEE ONCOLOGY TABLE BELOW. 2. Breast, excision, Left Superior Margin - LOBULAR NEOPLASIA (ATYPICAL LOBULAR HYPERPLASIA). - SEE COMMENT. 3. Breast, excision, Left Lateral Margin - BENIGN BREAST PARENCHYMA. - THERE IS NO EVIDENCE OF MALIGNANCY. - SEE COMMENT. Microscopic Comment 1. BREAST, IN SITU CARCINOMA Specimen, including laterality: Left breast. Procedure (include lymph node sampling sentinel-non-sentinel): Lumpectomy and additional superior and lateral margin resections. Grade of carcinoma: Low grade. Necrosis: Not identified. Estimated tumor size: (glass slide measurement): Less than 0.1 cm. Treatment effect: [name]. Distance to closest margin: Focally 0.1 cm to the inferior margin of specimen #1. Breast prognostic profile: [id number]. Estrogen receptor: 100%, strong staining intensity. Progesterone receptor: 5%, strong staining intensity. Lymph nodes: None examined. TNM: pTis, pNX.  Diagnosis 10/22/2016 Breast, left, needle core biopsy, lower inner quadrant - DUCTAL CARCINOMA IN SITU WITH CALCIFICATIONS AND COMEDONECROSIS. - SEE COMMENT.  As sampled, the ductal carcinoma in situ is  intermediate to high grade. A quantitative estrogen receptor and progesterone receptor will be performed and reported in an addendum to follow. The findings are called to the [facility] on 10/24/15. Dr. Saralyn Pilar has seen this case in consultation with agreement. (RAH:gt, 10/24/15) Results: IMMUNOHISTOCHEMICAL AND MORPHOMETRIC ANALYSIS PERFORMED MANUALLY Estrogen Receptor: 100%, POSITIVE, STRONG STAINING INTENSITY Progesterone Receptor: 5%, POSITIVE, STRONG STAINING INTENSITY  Diagnosis 12/25/2015 Breast, left, needle  core biopsy, lower inner quadrant, mid breast - ATYPICAL DUCTAL HYPERPLASIA WITH CALCIFICATIONS.   RADIOGRAPHIC STUDIES: I have personally reviewed the radiological images as listed and agreed with the findings in the report.  MR breast b/l w wo contrast 11/13/2015 IMPRESSION: 1. Abnormal non masslike enhancement in the left breast is seen in 2 locations consistent with the previous to biopsy locations. The most extensive area is in the middle third of the left breast, lower outer quadrant, covering 4.3 x 1.6 x 1.4 cm. The smaller area is along the far posterior lower inner quadrant covering 2.6 x 1.0 x 1.0 cm. 2. No other abnormal enhancement is seen to suggest additional areas of atypia or malignancy. 3. Normal appearance of the right breast. No evidence of adenopathy.  RECOMMENDATION: 1. Surgical excision of the area of DCIS central lower outer aspect of the left breast. 2. Surgical excision should be considered for the additional area of non mass enhancement along the posterior lower inner quadrant of the left breast, where the previous biopsy demonstrated atypical lobular Hyperplasia.  Diagnostic mammogram left 10/09/2015 IMPRESSION: The 3.0 cm loosely grouped fine punctate calcifications in the lower inner left breast are suspicious, and warrant biopsy.  RECOMMENDATION: Stereotactic biopsy is recommended for the left breast calcifications.      ASSESSMENT & PLAN:  50 year old perimenopausal woman, presented with screening discovered DCIS.  1. Breast cancer of lower-outer outer quadrant of left female breast, DCIS, intermediate to high grade, ER+ /PR+ -I discussed her breast imaging, needle biopsy and surgical path results with patient in great detail. -She is s/p left breast lumpectomy and adjuvant radiation. -Given her family history of breast cancer and her young age,  we recommend her to undergo genetic testing to ruled out inheritable breast cancer, she will think about it, she is concerned about her insurance coverage for genetic testing. -Her DCIS has been cured by complete surgical resection. Any form of adjuvant therapy is preventive. -Given her strongly positive ER, I recommend her to consider antiestrogen therapy with tamoxifen, which decrease her risk of future breast cancer by ~40%.  -We used the DCIS nomogram from [facility] to cataract. Her risk of secondary breast cancer with and without postoperative radiation and antiestrogen therapy. I gave her the report  -Her risk of secondary breast cancer has significantly reduced after breast radiation, additional tamoxifen for 5 years will further decrease her absolute risk of breast cancer by 5% in the next 10 years, however there is no survival benefit from tamoxifen. Given the potential side effects, especially endometrial cancer, and hot flash, patient is reluctant to take tamoxifen. -We discussed breast cancer surveillance after she completes treatment, Including annual mammogram, breast exam every 6-12 months, and self exam.   Plan -She will think about tamoxifen, and call with me with her decision.  -If she decides not to take tamoxifen, she will likely follow-up with her PCP or gynecologist for breast cancer screening.   All questions were answered. The patient knows to call the clinic with any problems, questions or concerns. I spent 55 minutes  counseling the patient face to face. The total time spent in the appointment was 60 minutes and more than 50% was on counseling.     Truitt Merle, MD 07/09/2016 8:05 AM

## 2016-07-10 ENCOUNTER — Other Ambulatory Visit (HOSPITAL_BASED_OUTPATIENT_CLINIC_OR_DEPARTMENT_OTHER): Payer: Medicaid Other

## 2016-07-10 ENCOUNTER — Telehealth: Payer: Self-pay | Admitting: Hematology

## 2016-07-10 ENCOUNTER — Ambulatory Visit (HOSPITAL_BASED_OUTPATIENT_CLINIC_OR_DEPARTMENT_OTHER): Payer: Medicaid Other | Admitting: Hematology

## 2016-07-10 ENCOUNTER — Encounter: Payer: Self-pay | Admitting: Hematology

## 2016-07-10 VITALS — BP 149/89 | HR 90 | Temp 98.4°F | Resp 17 | Ht 64.0 in | Wt 195.2 lb

## 2016-07-10 DIAGNOSIS — E119 Type 2 diabetes mellitus without complications: Secondary | ICD-10-CM | POA: Diagnosis not present

## 2016-07-10 DIAGNOSIS — Z170 Estrogen receptor positive status [ER+]: Secondary | ICD-10-CM | POA: Diagnosis not present

## 2016-07-10 DIAGNOSIS — I509 Heart failure, unspecified: Secondary | ICD-10-CM | POA: Diagnosis not present

## 2016-07-10 DIAGNOSIS — D0512 Intraductal carcinoma in situ of left breast: Secondary | ICD-10-CM | POA: Diagnosis present

## 2016-07-10 DIAGNOSIS — D0511 Intraductal carcinoma in situ of right breast: Secondary | ICD-10-CM

## 2016-07-10 DIAGNOSIS — C50512 Malignant neoplasm of lower-outer quadrant of left female breast: Secondary | ICD-10-CM

## 2016-07-10 DIAGNOSIS — I5023 Acute on chronic systolic (congestive) heart failure: Secondary | ICD-10-CM

## 2016-07-10 DIAGNOSIS — I10 Essential (primary) hypertension: Secondary | ICD-10-CM

## 2016-07-10 LAB — COMPREHENSIVE METABOLIC PANEL
ALBUMIN: 3.8 g/dL (ref 3.5–5.0)
ALK PHOS: 61 U/L (ref 40–150)
ALT: 12 U/L (ref 0–55)
ANION GAP: 10 meq/L (ref 3–11)
AST: 14 U/L (ref 5–34)
BILIRUBIN TOTAL: 0.53 mg/dL (ref 0.20–1.20)
BUN: 9.7 mg/dL (ref 7.0–26.0)
CO2: 25 meq/L (ref 22–29)
CREATININE: 0.8 mg/dL (ref 0.6–1.1)
Calcium: 9.3 mg/dL (ref 8.4–10.4)
Chloride: 106 mEq/L (ref 98–109)
EGFR: >90 mL/min/{1.73_m2} (ref >90)
GLUCOSE: 196 mg/dL — AB (ref 70–140)
Potassium: 4.3 mEq/L (ref 3.5–5.1)
Sodium: 141 mEq/L (ref 136–145)
TOTAL PROTEIN: 7.6 g/dL (ref 6.4–8.3)

## 2016-07-10 LAB — CBC WITH DIFFERENTIAL/PLATELET
BASO%: 0.5 % (ref 0.0–2.0)
Basophils Absolute: 0 10*3/uL (ref 0.0–0.1)
EOS ABS: 0.1 10*3/uL (ref 0.0–0.5)
EOS%: 3.3 % (ref 0.0–7.0)
HEMATOCRIT: 44.6 % (ref 34.8–46.6)
HEMOGLOBIN: 14.3 g/dL (ref 11.6–15.9)
LYMPH%: 30 % (ref 14.0–49.7)
MCH: 25.3 pg (ref 25.1–34.0)
MCHC: 32 g/dL (ref 31.5–36.0)
MCV: 78.9 fL — ABNORMAL LOW (ref 79.5–101.0)
MONO#: 0.3 10*3/uL (ref 0.1–0.9)
MONO%: 9.9 % (ref 0.0–14.0)
NEUT%: 56.3 % (ref 38.4–76.8)
NEUTROS ABS: 1.7 10*3/uL (ref 1.5–6.5)
PLATELETS: 237 10*3/uL (ref 145–400)
RBC: 5.65 10*6/uL — AB (ref 3.70–5.45)
RDW: 15 % — AB (ref 11.2–14.5)
WBC: 3.1 10*3/uL — AB (ref 3.9–10.3)
lymph#: 0.9 10*3/uL (ref 0.9–3.3)

## 2016-07-10 NOTE — Telephone Encounter (Signed)
Gave patient avs report and appointments for October and December  °

## 2016-07-10 NOTE — Progress Notes (Signed)
[place]  [phone] [phone]) [phone]  Clinic follow Up Note   Patient Care Team: Lin Landsman, MD as PCP - General (Family Medicine) 07/10/2016   CHIEF COMPLAINTS:  Left breast cancer DCIS   Oncology History   Breast cancer of lower-outer quadrant of left female breast [facility])   Staging form: Breast, AJCC 7th Edition     Clinical: Stage 0 (Tis (DCIS), N0, M0) - Unsigned     Pathologic stage from 02/17/2016: Stage 0 (Tis (DCIS), N0, cM0) - Signed by Truitt Merle, MD on 03/08/2016       Breast cancer of lower-outer quadrant of left female breast ([place])   09/12/2015 Mammogram    Screening mammogram showed calcification in left breast       10/09/2015 Mammogram    diagnostoc mammo and US showed 3.0cm  loosely grouped calcification in the lower inner left breast are suspicious       10/23/2015 Initial Diagnosis    Breast cancer of lower-outer quadrant of left female breast ([place])      10/23/2015 Initial Biopsy    left breast biopsy showed DCIS and comedonecrosis       11/13/2015 Imaging    Breast MRI showed known masslike enhancement in the left breast in 2 locations, measuring 4.3 x 1.6 x 1.4 cm, and 2.6 x 1.0 x 1.0 cm. Normal appearance of the right breast. No evidence of adenopathy.      02/17/2016 Surgery    left breast lumpectomy      02/17/2016 Pathology Results    left breast lumpectomy showed low grade DCIS, less than 0.1cm, (+) ALH, DCIS focally 0.1cm to the inferior margin      03/23/2016 - 05/07/2016 Radiation Therapy    Left breast radiation       HISTORY OF PRESENTING ILLNESS:  Kristen Snyder 50 y.o. female is here because of Her recently diagnosed left breast cancer. She presents to the clinic by herself.  This was discovered by screening mammogram in October 2016. The diagnostic mammogram and ultrasound showed a 3 cm grouped calcifications in the lower inner left breast, MRI of the breast showed no mass like enhancement in the  left breast in 2 locations, measuring 4.3 cm and 2.6 cm. She underwent needle biopsy twice, one showed DCIS, the second one showed a typical ductal hyperplasia. She underwent left breast lumpectomy on 02/17/2016, tolerated well. She has recently completed adjuvant breast radiation one week ago. She tolerated radiation very well also.  She feels well, denies any significant pain, or other discomfort. She is physically active, has mild arthralgia from arthritis, no other complaints. Her menstrual period has been irregular in the past year, less frequent than before, her last period was in May 2017.   CURRENT THERAPY: Tamoxifen 20mg  daily started on 05/30/2016   INTERIM HISTORY: [name] returns for follow up. She started Tamoxifen in mid July. She has been tolerating it well overall, she noticed slightly worse hot flush since then, but no other significant side effects. She has been under stress due to her job issue, and felt depressed some time, but probably not related to tamoxifen.   MEDICAL HISTORY:  Past Medical History:  Diagnosis Date  . Anxiety   . 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 Dental crown present   . 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 History of congestive heart failure 10/2014  . Hypertension    states under control with meds.,  has been on med. at least 89 yr.  . Insulin dependent diabetes mellitus ([place])   . Nonischemic cardiomyopathy ([place])     SURGICAL HISTORY: Past Surgical History:  Procedure Laterality Date  . BREAST LUMPECTOMY WITH NEEDLE LOCALIZATION Left 02/17/2016   Procedure: LEFT BREAST LUMPECTOMY WITH BRACKETED NEEDLE LOCALIZATION;  Surgeon: Excell Seltzer, MD;  Location: [place];  Service: General;  Laterality: Left;  . KNEE ARTHROSCOPY WITH ANTERIOR CRUCIATE LIGAMENT (ACL) REPAIR Right   . LEFT AND RIGHT HEART CATHETERIZATION WITH CORONARY ANGIOGRAM [name] 10/08/2014   Procedure: LEFT AND RIGHT HEART  CATHETERIZATION WITH CORONARY ANGIOGRAM;  Surgeon: Jettie Booze, MD;  Location: [facility] CATH LAB;  Service: Cardiovascular;  Laterality: [name];  . SHOULDER ARTHROSCOPY W/ ROTATOR CUFF REPAIR Right     SOCIAL HISTORY: Social History   Social History  . Marital status: Married    Spouse name: [name]  . Number of children: 1  . Years of education: [name]   Occupational History  .      [employer]   Social History Main Topics  . Smoking status: Never Smoker  . Smokeless tobacco: Never Used  . Alcohol use 0.6 oz/week    1 Glasses of wine per week     Comment: occasionally  . Drug use: No  . Sexual activity: Yes   Other Topics Concern  . Not on file   Social History Narrative  . No narrative on file    FAMILY HISTORY: Family History  Problem Relation Age of Onset  . Heart disease      No family history  . Diabetes Mother   . Hypertension Mother   . Cancer Mother 49    colon  . Hypertension Father   . Diabetes Father   . Cancer Father 81    prostate cancer   . Cancer Paternal Aunt     breast cancer   . Cancer Paternal Aunt 36    ovarian cancer     ALLERGIES:  is allergic to bidil [isosorb dinitrate-hydralazine].  MEDICATIONS:  Current Outpatient Prescriptions  Medication Sig Dispense Refill  . ACCU-CHEK AVIVA PLUS test strip   0  . aspirin EC 81 MG tablet Take 1 tablet (81 mg total) by mouth daily. 30 tablet 2  . carvedilol (COREG) 12.5 MG tablet Take 1 tablet (12.5 mg total) by mouth 2 (two) times daily with a meal. 180 tablet 1  . LANTUS SOLOSTAR 100 UNIT/ML Solostar Pen Inject 80 Units into the skin at bedtime.   0  . lisinopril (PRINIVIL,ZESTRIL) 40 MG tablet Take 40 mg by mouth daily.  0  . Multiple Vitamin (MULTIVITAMIN) tablet Take 1 tablet by mouth daily.    Marland Kitchen spironolactone (ALDACTONE) 25 MG tablet Take 1 tablet (25 mg total) by mouth daily. NEED OV. 30 tablet 2  . tamoxifen (NOLVADEX) 20 MG tablet Take 1 tablet (20 mg total) by mouth daily. 30 tablet 2   . hyaluronate sodium (RADIAPLEXRX) GEL Apply 1 application topically 2 (two) times daily. Another tube given 05/01/16     No current facility-administered medications for this visit.     REVIEW OF SYSTEMS:   Constitutional: Denies fevers, chills or abnormal night sweats Eyes: Denies blurriness of vision, double vision or watery eyes Ears, nose, mouth, throat, and face: Denies mucositis or sore throat Respiratory: Denies cough, dyspnea or wheezes Cardiovascular: Denies palpitation, chest discomfort or lower extremity swelling Gastrointestinal:  Denies nausea, heartburn or change in bowel habits Skin: Denies abnormal  skin rashes Lymphatics: Denies new lymphadenopathy or easy bruising Neurological:Denies numbness, tingling or new weaknesses Behavioral/Psych: Mood is stable, no new changes  All other systems were reviewed with the patient and are negative.  PHYSICAL EXAMINATION: ECOG PERFORMANCE STATUS: 0 - Asymptomatic  Vitals:   07/10/16 0856  BP: (!) 149/89  Pulse: 90  Resp: 17  Temp: 98.4 F (36.9 C)   Filed Weights   07/10/16 0856  Weight: 195 lb 3.2 oz (88.5 kg)    GENERAL:alert, no distress and comfortable SKIN: skin color, texture, turgor are normal, no rashes or significant lesions EYES: normal, conjunctiva are pink and non-injected, sclera clear OROPHARYNX:no exudate, no erythema and lips, buccal mucosa, and tongue normal  NECK: supple, thyroid normal size, non-tender, without nodularity LYMPH:  no palpable lymphadenopathy in the cervical, axillary or inguinal LUNGS: clear to auscultation and percussion with normal breathing effort HEART: regular rate & rhythm and no murmurs and no lower extremity edema ABDOMEN:abdomen soft, non-tender and normal bowel sounds Musculoskeletal:no cyanosis of digits and no clubbing  PSYCH: alert & oriented x 3 with fluent speech NEURO: no focal motor/sensory deficits Breasts: Breast inspection showed them to be symmetrical with no  nipple discharge. (+) Diffuse skin pigmentation of the left breast from radiation, healed skin peeling. Palpation of the breasts and axilla revealed no obvious mass that I could appreciate.   LABORATORY DATA:  I have reviewed the data as listed CBC Latest Ref Rng & Units 07/10/2016 10/19/2014 10/08/2014  WBC 3.9 - 10.3 10e3/uL 3.1(L) 6.5 4.7  Hemoglobin 11.6 - 15.9 g/dL 14.3 10.8(L) 11.0(L)  Hematocrit 34.8 - 46.6 % 44.6 35.1(L) 35.5(L)  Platelets 145 - 400 10e3/uL 237 403(H) 438(H)   CMP Latest Ref Rng & Units 07/10/2016 02/12/2016 12/19/2015  Glucose 70 - 140 mg/dl 196(H) 385(H) 184(H)  BUN 7.0 - 26.0 mg/dL 9.7 8 5(L)  Creatinine 0.6 - 1.1 mg/dL 0.8 0.82 0.66  Sodium 136 - 145 mEq/L 141 136 139  Potassium 3.5 - 5.1 mEq/L 4.3 4.4 4.5  Chloride 101 - 111 mmol/L - 101 103  CO2 22 - 29 mEq/L 25 24 27   Calcium 8.4 - 10.4 mg/dL 9.3 9.2 8.9  Total Protein 6.4 - 8.3 g/dL 7.6 - -  Total Bilirubin 0.20 - 1.20 mg/dL 0.53 - -  Alkaline Phos 40 - 150 U/L 61 - -  AST 5 - 34 U/L 14 - -  ALT 0 - 55 U/L 12 - -   PATHOLOGY REPORT  Diagnosis 02/17/2016 1. Breast, lumpectomy, Left Breast - DUCTAL CARCINOMA IN SITU, LOW GRADE, SPANNING LESS THAN 0.1 CM. - DUCTAL CARCINOMA IN SITU IS FOCALLY 0.1 CM TO THE INFERIOR MARGIN OF SPECIMEN #1. - SEE ONCOLOGY TABLE BELOW. 2. Breast, excision, Left Superior Margin - LOBULAR NEOPLASIA (ATYPICAL LOBULAR HYPERPLASIA). - SEE COMMENT. 3. Breast, excision, Left Lateral Margin - BENIGN BREAST PARENCHYMA. - THERE IS NO EVIDENCE OF MALIGNANCY. - SEE COMMENT. Microscopic Comment 1. BREAST, IN SITU CARCINOMA Specimen, including laterality: Left breast. Procedure (include lymph node sampling sentinel-non-sentinel): Lumpectomy and additional superior and lateral margin resections. Grade of carcinoma: Low grade. Necrosis: Not identified. Estimated tumor size: (glass slide measurement): Less than 0.1 cm. Treatment effect: [name]. Distance to closest margin: Focally 0.1 cm  to the inferior margin of specimen #1. Breast prognostic profile: [id number]. Estrogen receptor: 100%, strong staining intensity. Progesterone receptor: 5%, strong staining intensity. Lymph nodes: None examined. TNM: pTis, pNX.  Diagnosis 10/22/2016 Breast, left, needle core biopsy, lower inner quadrant - DUCTAL CARCINOMA  IN SITU WITH CALCIFICATIONS AND COMEDONECROSIS. - SEE COMMENT.  As sampled, the ductal carcinoma in situ is intermediate to high grade. A quantitative estrogen receptor and progesterone receptor will be performed and reported in an addendum to follow. The findings are called to the [facility] on 10/24/15. Dr. Saralyn Pilar has seen this case in consultation with agreement. (RAH:gt, 10/24/15) Results: IMMUNOHISTOCHEMICAL AND MORPHOMETRIC ANALYSIS PERFORMED MANUALLY Estrogen Receptor: 100%, POSITIVE, STRONG STAINING INTENSITY Progesterone Receptor: 5%, POSITIVE, STRONG STAINING INTENSITY  Diagnosis 12/25/2015 Breast, left, needle core biopsy, lower inner quadrant, mid breast - ATYPICAL DUCTAL HYPERPLASIA WITH CALCIFICATIONS.   RADIOGRAPHIC STUDIES: I have personally reviewed the radiological images as listed and agreed with the findings in the report.  MR breast b/l w wo contrast 11/13/2015 IMPRESSION: 1. Abnormal non masslike enhancement in the left breast is seen in 2 locations consistent with the previous to biopsy locations. The most extensive area is in the middle third of the left breast, lower outer quadrant, covering 4.3 x 1.6 x 1.4 cm. The smaller area is along the far posterior lower inner quadrant covering 2.6 x 1.0 x 1.0 cm. 2. No other abnormal enhancement is seen to suggest additional areas of atypia or malignancy. 3. Normal appearance of the right breast. No evidence of adenopathy.  RECOMMENDATION: 1. Surgical excision of the area of DCIS central lower outer aspect of the left breast. 2. Surgical excision should be considered for  the additional area of non mass enhancement along the posterior lower inner quadrant of the left breast, where the previous biopsy demonstrated atypical lobular Hyperplasia.  Diagnostic mammogram left 10/09/2015 IMPRESSION: The 3.0 cm loosely grouped fine punctate calcifications in the lower inner left breast are suspicious, and warrant biopsy.  RECOMMENDATION: Stereotactic biopsy is recommended for the left breast calcifications.    ASSESSMENT & PLAN:  50 year old perimenopausal woman, presented with screening discovered DCIS.  1. Breast cancer of lower-outer outer quadrant of left female breast, DCIS, intermediate to high grade, ER+ /PR+ -I discussed her breast imaging, needle biopsy and surgical path results with patient in great detail. -She is s/p left breast lumpectomy and adjuvant radiation. -Given her family history of breast cancer and her young age,  we recommend her to undergo genetic testing to ruled out inheritable breast cancer, she will think about it, she is concerned about her insurance coverage for genetic testing. -Her DCIS has been cured by complete surgical resection. Any form of adjuvant therapy is preventive. -Given her strongly positive ER, I recommend her to consider antiestrogen therapy with tamoxifen, which decrease her risk of future breast cancer by ~40%.  -We used the DCIS nomogram from [facility] to cataract. Her risk of secondary breast cancer with and without postoperative radiation and antiestrogen therapy. I gave her the report  -Her risk of secondary breast cancer has significantly reduced after breast radiation, additional tamoxifen for 5 years will further decrease her absolute risk of breast cancer by 5% in the next 10 years, however there is no survival benefit from tamoxifen.  -Patient decided to try tamoxifen, she has been tolerating well since she started overall months ago, with mild hot flash. No other side effects we plan to have  tamoxifen for a total 5 years. -We will continue breast cancer surveillance, Including annual mammogram, breast exam every 6-12 months, and self exam. She is due for mammogram in November, I'll schedule him for her  2. HTN, DM, CHF -She'll continue follow-up with her primary care physician  Plan -Continue  tamoxifen   -Mammogram in November 2017 -I'll see her back in 4 months with lab   All questions were answered. The patient knows to call the clinic with any problems, questions or concerns. I spent 20 minutes counseling the patient face to face. The total time spent in the appointment was 25 minutes and more than 50% was on counseling.     Truitt Merle, MD 07/10/2016 9:25 AM

## 2016-07-23 ENCOUNTER — Ambulatory Visit: Payer: Medicaid Other | Admitting: Hematology

## 2016-07-23 ENCOUNTER — Other Ambulatory Visit: Payer: Medicaid Other

## 2016-08-12 ENCOUNTER — Encounter: Payer: Self-pay | Admitting: *Deleted

## 2016-08-14 ENCOUNTER — Telehealth: Payer: Self-pay | Admitting: Hematology

## 2016-08-14 NOTE — Telephone Encounter (Signed)
10/02 Appointment canceled per patient request. Per the patient, only available on Friday's or between 8AM and 9 AM during the week but have to be out within an hour per the patient's work schedule.

## 2016-08-17 ENCOUNTER — Encounter: Payer: Medicaid Other | Admitting: Adult Health

## 2016-08-21 ENCOUNTER — Telehealth: Payer: Self-pay | Admitting: *Deleted

## 2016-08-21 ENCOUNTER — Other Ambulatory Visit: Payer: Self-pay | Admitting: *Deleted

## 2016-08-21 MED ORDER — TAMOXIFEN CITRATE 20 MG PO TABS: 20.0000 mg | ORAL_TABLET | Freq: Every day | ORAL | 2 refills | Status: DC

## 2016-08-21 NOTE — Telephone Encounter (Signed)
Yes, pls refill. Thanks   Truitt Merle MD

## 2016-08-21 NOTE — Telephone Encounter (Signed)
Received call @ 935 from patient in regards to needing a refill on her Tamoxifen. Call was transferred to vmail

## 2016-09-01 IMAGING — CR DG CHEST 2V
2 series · 2 of 2 positions shown · non-contrast
Comparison: None.

ADDENDUM:
Impression should read: Evidence of a degree of congestive heart
failure.
CLINICAL DATA: For with history of cough. Persistent difficulty
breathing

EXAM:
CHEST  2 VIEW

[view not recorded (1 of 2)]
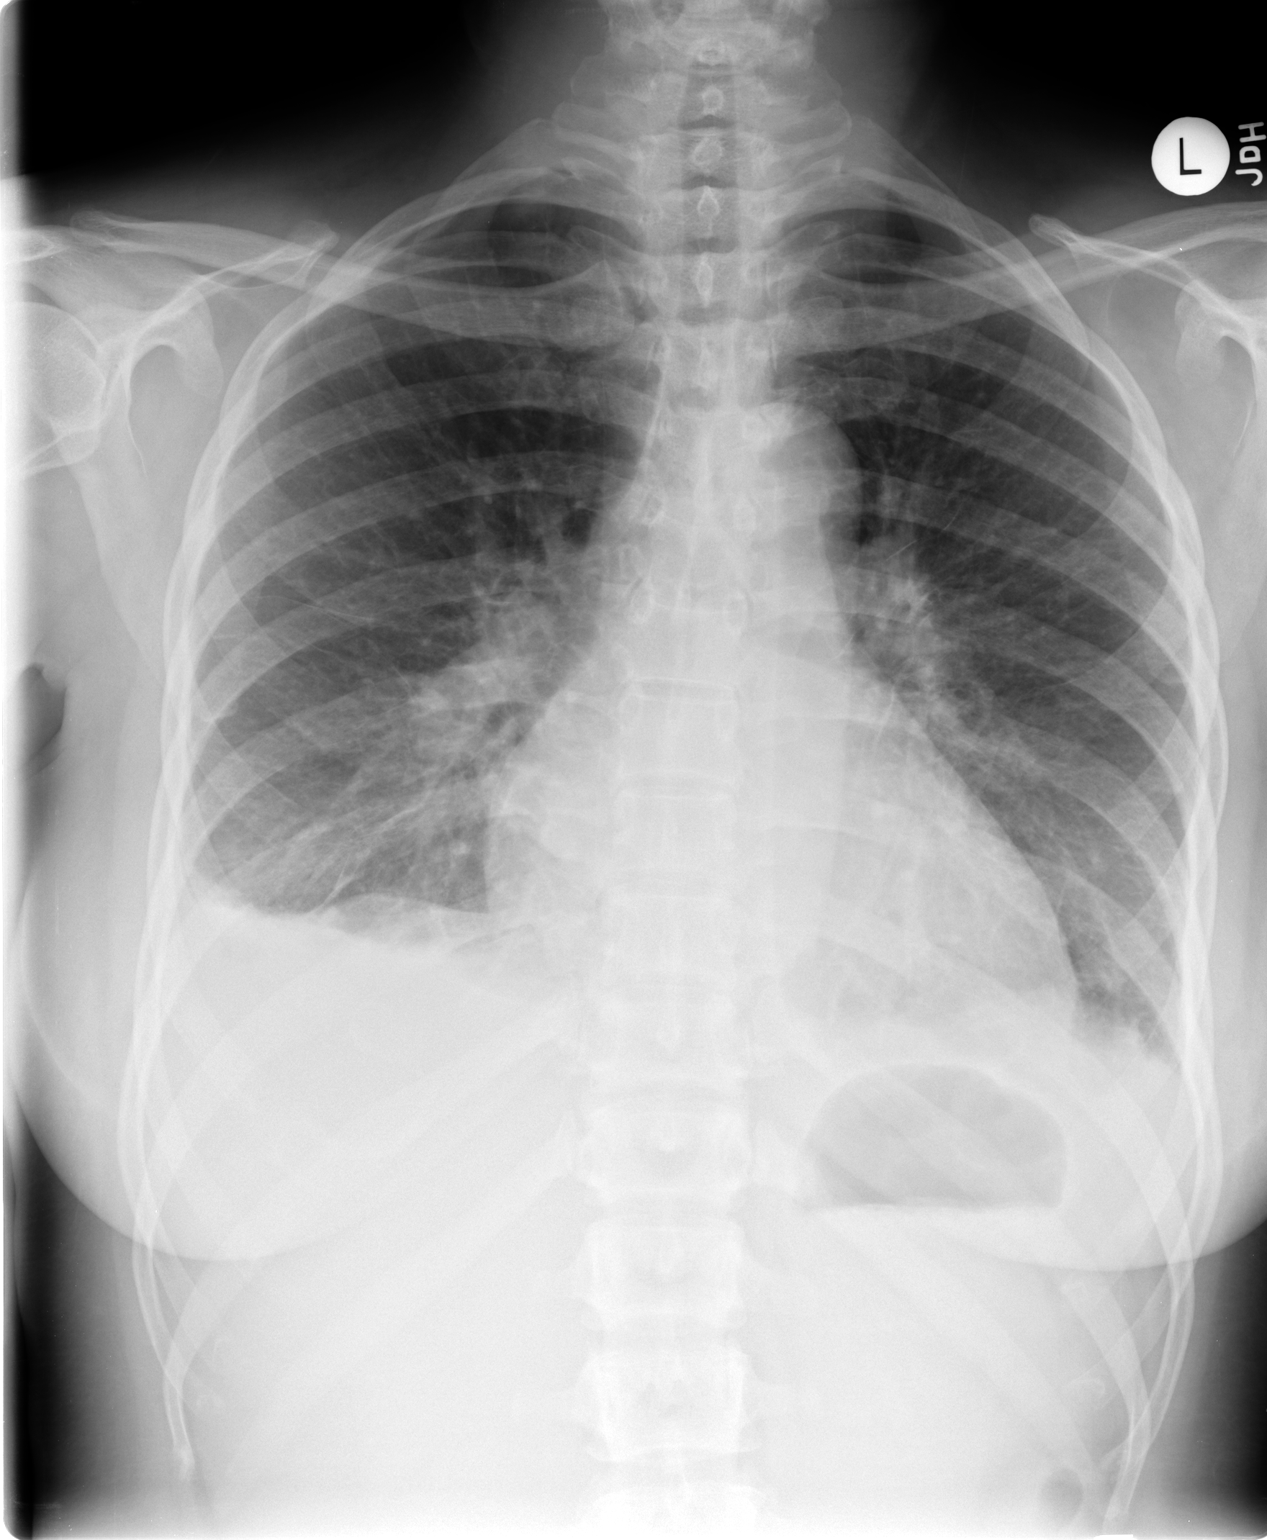

[view not recorded (2 of 2)]
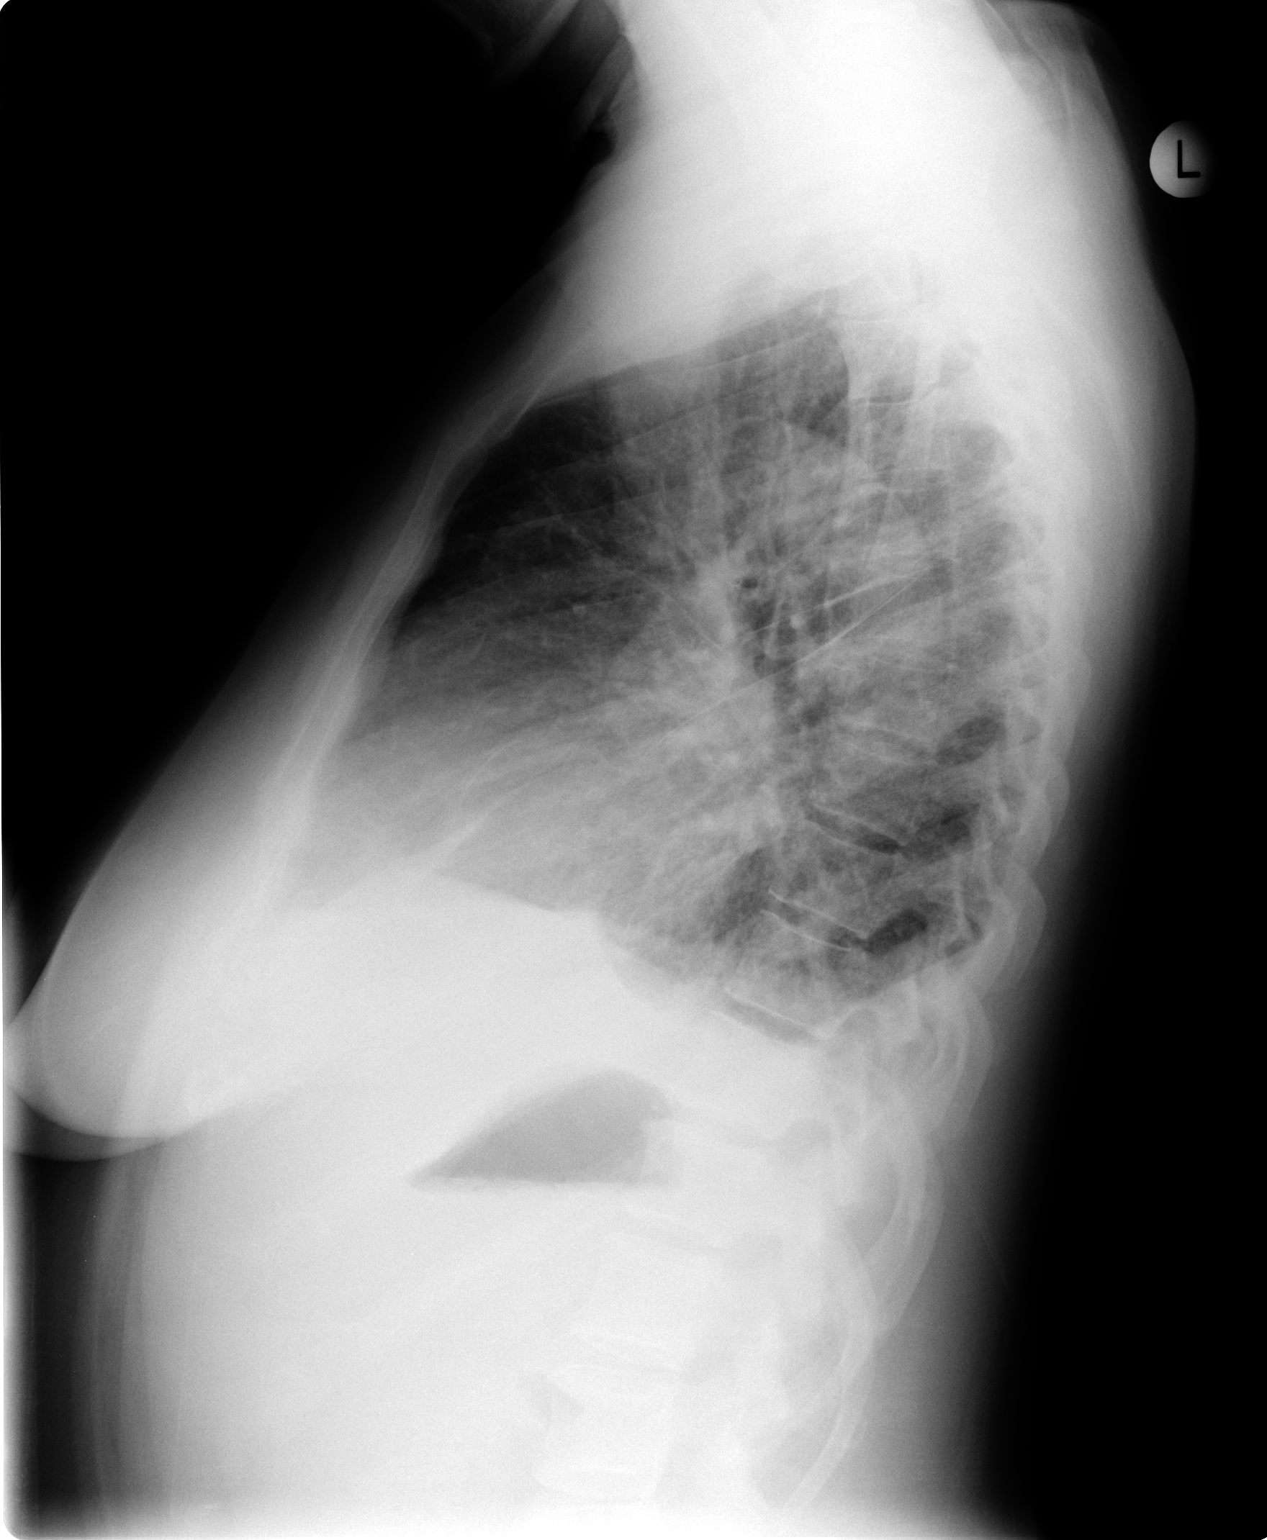

[2 of 2 positions shown; findings below may reference images not displayed]

FINDINGS: There are small pleural effusions bilaterally with cardiomegaly and
borderline pulmonary venous hypertension. There is trace
interstitial edema in the bases. There is no consolidation. No
adenopathy. No bone lesions.
IMPRESSION: No evidence of a degree of congestive heart failure. No airspace
consolidation.

These results will be called to the ordering clinician or
representative by the Radiologist Assistant, and communication
documented in the PACS or zVision Dashboard.

## 2016-09-04 ENCOUNTER — Telehealth: Payer: Self-pay | Admitting: *Deleted

## 2016-09-04 NOTE — Telephone Encounter (Signed)
Pt called requesting a call back from nurse.  Spoke with pt and was informed that pt had noticed occasional very pale, pink color when wiping after voiding.  Stated no active bright red blood bleeding noted. Pt also stated in June, pt did have a cycle with normal blood flow lasting for about 5 - 7 days.    Instructed pt to continue to monitor for any active bleeding, and to call office back for further instructions. Pt's    Phone       [phone].

## 2016-09-22 ENCOUNTER — Encounter: Payer: Self-pay | Admitting: Physician Assistant

## 2016-09-25 ENCOUNTER — Ambulatory Visit: Payer: Medicaid Other | Admitting: Physician Assistant

## 2016-10-02 ENCOUNTER — Ambulatory Visit (INDEPENDENT_AMBULATORY_CARE_PROVIDER_SITE_OTHER): Payer: Medicaid Other | Admitting: Cardiology

## 2016-10-02 ENCOUNTER — Encounter: Payer: Self-pay | Admitting: Cardiology

## 2016-10-02 VITALS — BP 138/88 | HR 95 | Ht 65.0 in | Wt 187.8 lb

## 2016-10-02 DIAGNOSIS — E119 Type 2 diabetes mellitus without complications: Secondary | ICD-10-CM | POA: Diagnosis not present

## 2016-10-02 DIAGNOSIS — Z794 Long term (current) use of insulin: Secondary | ICD-10-CM

## 2016-10-02 DIAGNOSIS — I10 Essential (primary) hypertension: Secondary | ICD-10-CM | POA: Diagnosis not present

## 2016-10-02 DIAGNOSIS — IMO0001 Reserved for inherently not codable concepts without codable children: Secondary | ICD-10-CM | POA: Insufficient documentation

## 2016-10-02 DIAGNOSIS — Z0389 Encounter for observation for other suspected diseases and conditions ruled out: Secondary | ICD-10-CM

## 2016-10-02 DIAGNOSIS — I428 Other cardiomyopathies: Secondary | ICD-10-CM

## 2016-10-02 NOTE — Assessment & Plan Note (Signed)
Nov 2015 

## 2016-10-02 NOTE — Assessment & Plan Note (Signed)
Slightly high today but pt says its usually controlled at home

## 2016-10-02 NOTE — Assessment & Plan Note (Signed)
NICM in Nov 2015 which resolved by June 2016

## 2016-10-02 NOTE — Progress Notes (Signed)
10/02/2016 Kristen Snyder   12-Nov-1966  [id number]  Primary Physician [name], MD Primary Cardiologist: Dr Stanford Breed  HPI:  Pleasant 50 y/o AA female with a history of NICM in Nov 2015. Echo in June 2016 showed her EF to be 50-55%. In Oct 2016 she was diagnosed with breast cancer treated with lumpectomy, radiation, and Tamoxifen. She is in the office today for a check up. She has been doing well, no edema, no dyspnea, no orthopnea (her presented symptom in Nov 2015).    Current Outpatient Prescriptions  Medication Sig Dispense Refill  . aspirin EC 81 MG tablet Take 1 tablet (81 mg total) by mouth daily. 30 tablet 2  . carvedilol (COREG) 12.5 MG tablet Take 1 tablet (12.5 mg total) by mouth 2 (two) times daily with a meal. 180 tablet 1  . furosemide (LASIX) 80 MG tablet Take 80 mg by mouth as needed. For weight gain    . LANTUS SOLOSTAR 100 UNIT/ML Solostar Pen Inject 80 Units into the skin at bedtime.   0  . lisinopril (PRINIVIL,ZESTRIL) 40 MG tablet Take 40 mg by mouth daily.  0  . Multiple Vitamin (MULTIVITAMIN) tablet Take 1 tablet by mouth daily.    Marland Kitchen spironolactone (ALDACTONE) 25 MG tablet Take 1 tablet (25 mg total) by mouth daily. NEED OV. 30 tablet 2  . tamoxifen (NOLVADEX) 20 MG tablet Take 1 tablet (20 mg total) by mouth daily. 30 tablet 2   No current facility-administered medications for this visit.     Allergies  Allergen Reactions  . Bidil [Isosorb Dinitrate-Hydralazine] Other (See Comments)    DISORIENTATION; DIZZINESS    Social History   Social History  . Marital status: Married    Spouse name: [name]  . Number of children: 1  . Years of education: [name]   Occupational History  .      [employer]   Social History Main Topics  . Smoking status: Never Smoker  . Smokeless tobacco: Never Used  . Alcohol use 0.6 oz/week    1 Glasses of wine per week     Comment: occasionally  . Drug use: No  . Sexual activity: Yes   Other Topics Concern  .  Not on file   Social History Narrative  . No narrative on file     Review of Systems: General: negative for chills, fever, night sweats or weight changes.  Cardiovascular: negative for chest pain, dyspnea on exertion, edema, orthopnea, palpitations, paroxysmal nocturnal dyspnea or shortness of breath Dermatological: negative for rash Respiratory: negative for cough or wheezing Urologic: negative for hematuria Abdominal: negative for nausea, vomiting, diarrhea, bright red blood per rectum, melena, or hematemesis Neurologic: negative for visual changes, syncope, or dizziness All other systems reviewed and are otherwise negative except as noted above.    Blood pressure 138/88, pulse 95, height 5\' 5"  (1.651 m), weight 187 lb 12.8 oz (85.2 kg), last menstrual period 04/16/2015.  General appearance: alert, cooperative, no distress and mildly obese Neck: no carotid bruit and no JVD Lungs: clear to auscultation bilaterally Heart: regular rate and rhythm Extremities: extremities normal, atraumatic, no cyanosis or edema Skin: Skin color, texture, turgor normal. No rashes or lesions Neurologic: Grossly normal  EKG NSR  ASSESSMENT AND PLAN:   Nonischemic cardiomyopathy ([place]) NICM in Nov 2015 which resolved by June 2016  Normal coronary arteries Nov 2015  Essential hypertension Slightly high today but pt says its usually controlled at home  Type 2 diabetes mellitus  treated with insulin ([place]) Followed by PCP, normal renal function   PLAN  Same Rx. I asked her to keep track of her B/P- if it consistently is running > 120/80 I would consider additional hypertensive Rx.   Kerin Ransom PA-C 10/02/2016 11:24 AM

## 2016-10-02 NOTE — Patient Instructions (Signed)
Your physician wants you to follow-up in: 1 Year with Dr Stanford Breed. You will receive a reminder letter in the mail two months in advance. If you don't receive a letter, please call our office to schedule the follow-up appointment.          Happy Thanksgiving

## 2016-10-02 NOTE — Assessment & Plan Note (Signed)
Followed by PCP, normal renal function

## 2016-10-12 ENCOUNTER — Other Ambulatory Visit: Payer: Self-pay | Admitting: Cardiology

## 2016-10-12 DIAGNOSIS — I5023 Acute on chronic systolic (congestive) heart failure: Secondary | ICD-10-CM

## 2016-11-06 ENCOUNTER — Other Ambulatory Visit: Payer: Self-pay

## 2016-11-06 ENCOUNTER — Encounter: Payer: Medicaid Other | Admitting: Hematology

## 2016-11-06 NOTE — Progress Notes (Signed)
No show  This encounter was created in error - please disregard.

## 2016-11-23 ENCOUNTER — Telehealth: Payer: Self-pay | Admitting: Hematology

## 2016-11-23 NOTE — Telephone Encounter (Signed)
LEFT MESSAGE RE 1/12 LAB/FU

## 2016-11-27 ENCOUNTER — Other Ambulatory Visit: Payer: Medicaid Other

## 2016-11-27 ENCOUNTER — Encounter: Payer: Medicaid Other | Admitting: Hematology

## 2016-11-27 NOTE — Progress Notes (Signed)
No show  This encounter was created in error - please disregard.

## 2016-11-30 ENCOUNTER — Telehealth: Payer: Self-pay | Admitting: Hematology

## 2016-11-30 NOTE — Telephone Encounter (Signed)
Patient called and wanted to reschedule her appointment last Friday to today.  She just started a new job and her schedule did not permit her to come in she is off today and would like to try to get in to see the Dr

## 2016-12-01 ENCOUNTER — Telehealth: Payer: Self-pay | Admitting: Hematology

## 2016-12-01 NOTE — Telephone Encounter (Signed)
lvm to inform pt of r/s appt to 1/22 at 245 pm. Informed pt to call office if need to r/s appt

## 2016-12-04 ENCOUNTER — Telehealth: Payer: Self-pay | Admitting: Hematology

## 2016-12-04 ENCOUNTER — Telehealth: Payer: Self-pay

## 2016-12-04 NOTE — Telephone Encounter (Signed)
Received forwarded message by triage from patient. Per patient moved 1/22 lab/fu to next available early morning appointment. Lab/fu moved from 1/22 to 1/30 at 8:15 am. Left message for patient re new date/time, however voicemail did not receive entire message. Schedule mailed.

## 2016-12-04 NOTE — Telephone Encounter (Signed)
Pt called to r/s her 1/22 appt with Dr Burr Medico to fit her work schedule. She needs to be leaving [place] by 0930 for her job. Call forwarded to [facility] and in basket sent. In basket sent at "routine priority" b/c this is a 4 month follow up.

## 2016-12-07 ENCOUNTER — Ambulatory Visit: Payer: Medicaid Other | Admitting: Hematology

## 2016-12-07 ENCOUNTER — Other Ambulatory Visit: Payer: Medicaid Other

## 2016-12-10 NOTE — Progress Notes (Signed)
[place]  Telephone:([phone]) [phone] Fax:(336) [phone]  Clinic follow Up Note   Patient Care Team: Lin Landsman, MD as PCP - General (Family Medicine) 12/15/2016   CHIEF COMPLAINTS:  Left breast cancer DCIS   Oncology History   Breast cancer of lower-outer quadrant of left female breast [facility])   Staging form: Breast, AJCC 7th Edition     Clinical: Stage 0 (Tis (DCIS), N0, M0) - Unsigned     Pathologic stage from 02/17/2016: Stage 0 (Tis (DCIS), N0, cM0) - Signed by Truitt Merle, MD on 03/08/2016       Breast cancer of lower-outer quadrant of left female breast ([place])   09/12/2015 Mammogram    Screening mammogram showed calcification in left breast       10/09/2015 Mammogram    diagnostoc mammo and US showed 3.0cm  loosely grouped calcification in the lower inner left breast are suspicious       10/23/2015 Initial Diagnosis    Breast cancer of lower-outer quadrant of left female breast ([place])      10/23/2015 Initial Biopsy    left breast biopsy showed DCIS and comedonecrosis       10/23/2015 Receptors her2    ER 100%+, PR 5%+      11/13/2015 Imaging    Breast MRI showed known masslike enhancement in the left breast in 2 locations, measuring 4.3 x 1.6 x 1.4 cm, and 2.6 x 1.0 x 1.0 cm. Normal appearance of the right breast. No evidence of adenopathy.      02/17/2016 Surgery    left breast lumpectomy      02/17/2016 Pathology Results    left breast lumpectomy showed low grade DCIS, less than 0.1cm, (+) ALH, DCIS focally 0.1cm to the inferior margin      03/23/2016 - 05/07/2016 Radiation Therapy    Left breast radiation       HISTORY OF PRESENTING ILLNESS:  Kristen Snyder 51 y.o. female is here because of Her recently diagnosed left breast cancer. She presents to the clinic by herself.  This was discovered by screening mammogram in October 2016. The diagnostic mammogram and ultrasound showed a 3 cm grouped calcifications in the lower inner left breast, MRI  of the breast showed no mass like enhancement in the left breast in 2 locations, measuring 4.3 cm and 2.6 cm. She underwent needle biopsy twice, one showed DCIS, the second one showed a typical ductal hyperplasia. She underwent left breast lumpectomy on 02/17/2016, tolerated well. She has recently completed adjuvant breast radiation one week ago. She tolerated radiation very well also.  She feels well, denies any significant pain, or other discomfort. She is physically active, has mild arthralgia from arthritis, no other complaints. Her menstrual period has been irregular in the past year, less frequent than before, her last period was in May 2017.   CURRENT THERAPY: Tamoxifen 25m daily started on 05/30/2016   INTERIM HISTORY: Ms. JCunningreturns for follow up. Her last unilateral mammogram was 12/25/2015. She is doing well today. She has had a hard time getting to her appointments because her work schedule is from 10am-7pm. She changed jobs recently and her insurance isn't very good anymore. They don't pay for many office visits or for much of her prescriptions. Denies pain or SOB. She is still active. She hasn't had a period since last Spring.   MEDICAL HISTORY:  Past Medical History:  Diagnosis Date  . Anxiety   . 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  Dental crown present   . 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 History of congestive heart failure 10/2014  . Hypertension    states under control with meds., has been on med. at least 14 yr.  . Insulin dependent diabetes mellitus ([place])   . Nonischemic cardiomyopathy ([place])     SURGICAL HISTORY: Past Surgical History:  Procedure Laterality Date  . BREAST LUMPECTOMY WITH NEEDLE LOCALIZATION Left 02/17/2016   Procedure: LEFT BREAST LUMPECTOMY WITH BRACKETED NEEDLE LOCALIZATION;  Surgeon: Excell Seltzer, MD;  Location: [facility];  Service: General;  Laterality: Left;  . KNEE ARTHROSCOPY WITH  ANTERIOR CRUCIATE LIGAMENT (ACL) REPAIR Right   . LEFT AND RIGHT HEART CATHETERIZATION WITH CORONARY ANGIOGRAM [name] 10/08/2014   Procedure: LEFT AND RIGHT HEART CATHETERIZATION WITH CORONARY ANGIOGRAM;  Surgeon: Jettie Booze, MD;  Location: [facility] CATH LAB;  Service: Cardiovascular;  Laterality: [name];  . SHOULDER ARTHROSCOPY W/ ROTATOR CUFF REPAIR Right     SOCIAL HISTORY: Social History   Social History  . Marital status: Married    Spouse name: [name]  . Number of children: 1  . Years of education: [name]   Occupational History  .      [employer]   Social History Main Topics  . Smoking status: Never Smoker  . Smokeless tobacco: Never Used  . Alcohol use 0.6 oz/week    1 Glasses of wine per week     Comment: occasionally  . Drug use: No  . Sexual activity: Yes   Other Topics Concern  . Not on file   Social History Narrative  . No narrative on file    FAMILY HISTORY: Family History  Problem Relation Age of Onset  . Diabetes Mother   . Hypertension Mother   . Cancer Mother 47    colon  . Hypertension Father   . Diabetes Father   . Cancer Father 57    prostate cancer   . Heart disease      No family history  . Cancer Paternal Aunt     breast cancer   . Cancer Paternal Aunt 65    ovarian cancer     ALLERGIES:  is allergic to bidil [isosorb dinitrate-hydralazine].  MEDICATIONS:  Current Outpatient Prescriptions  Medication Sig Dispense Refill  . aspirin EC 81 MG tablet Take 1 tablet (81 mg total) by mouth daily. 30 tablet 2  . carvedilol (COREG) 12.5 MG tablet Take 1 tablet (12.5 mg total) by mouth 2 (two) times daily with a meal. 180 tablet 1  . furosemide (LASIX) 80 MG tablet Take 80 mg by mouth as needed. For weight gain    . LANTUS SOLOSTAR 100 UNIT/ML Solostar Pen Inject 80 Units into the skin at bedtime.   0  . lisinopril (PRINIVIL,ZESTRIL) 40 MG tablet Take 40 mg by mouth daily.  0  . Multiple Vitamin (MULTIVITAMIN) tablet Take 1 tablet by mouth  daily.    Marland Kitchen spironolactone (ALDACTONE) 25 MG tablet Take 1 tablet (25 mg total) by mouth daily. 30 tablet 11  . tamoxifen (NOLVADEX) 20 MG tablet Take 1 tablet (20 mg total) by mouth daily. 30 tablet 11  . LEVEMIR FLEXTOUCH 100 UNIT/ML Pen Inject 25 Units into the skin at bedtime.  0   No current facility-administered medications for this visit.     REVIEW OF SYSTEMS:   Constitutional: Denies fevers, chills or abnormal night sweats Eyes: Denies blurriness of vision, double vision or watery eyes Ears,  nose, mouth, throat, and face: Denies mucositis or sore throat Respiratory: Denies cough, dyspnea or wheezes Cardiovascular: Denies palpitation, chest discomfort or lower extremity swelling Gastrointestinal:  Denies nausea, heartburn or change in bowel habits Skin: Denies abnormal skin rashes Lymphatics: Denies new lymphadenopathy or easy bruising Neurological:Denies numbness, tingling or new weaknesses Behavioral/Psych: Mood is stable, no new changes  All other systems were reviewed with the patient and are negative.  PHYSICAL EXAMINATION: ECOG PERFORMANCE STATUS: 0 - Asymptomatic  Vitals:   12/15/16 0828  BP: (!) 162/85  Pulse: 84  Resp: 18  Temp: 98.2 F (36.8 C)   Filed Weights   12/15/16 0828  Weight: 193 lb 12.8 oz (87.9 kg)   GENERAL:alert, no distress and comfortable SKIN: skin color, texture, turgor are normal, no rashes or significant lesions EYES: normal, conjunctiva are pink and non-injected, sclera clear OROPHARYNX:no exudate, no erythema and lips, buccal mucosa, and tongue normal  NECK: supple, thyroid normal size, non-tender, without nodularity LYMPH:  no palpable lymphadenopathy in the cervical, axillary or inguinal LUNGS: clear to auscultation and percussion with normal breathing effort HEART: regular rate & rhythm and no murmurs and no lower extremity edema ABDOMEN:abdomen soft, non-tender and normal bowel sounds Musculoskeletal:no cyanosis of digits and no  clubbing  PSYCH: alert & oriented x 3 with fluent speech NEURO: no focal motor/sensory deficits Breasts: Breast inspection showed them to be symmetrical with no nipple discharge. (+) Diffuse skin pigmentation of the left breast from radiation, skin is wet and well healed. Palpation of the breasts and axilla revealed no obvious mass that I could appreciate.    LABORATORY DATA:  I have reviewed the data as listed CBC Latest Ref Rng & Units 12/15/2016 07/10/2016 10/19/2014  WBC 3.9 - 10.3 10e3/uL 3.2(L) 3.1(L) 6.5  Hemoglobin 11.6 - 15.9 g/dL 14.2 14.3 10.8(L)  Hematocrit 34.8 - 46.6 % 42.2 44.6 35.1(L)  Platelets 145 - 400 10e3/uL 219 237 403(H)   CMP Latest Ref Rng & Units 12/15/2016 07/10/2016 02/12/2016  Glucose 70 - 140 mg/dl 376(H) 196(H) 385(H)  BUN 7.0 - 26.0 mg/dL 9.9 9.7 8  Creatinine 0.6 - 1.1 mg/dL 0.9 0.8 0.82  Sodium 136 - 145 mEq/L 136 141 136  Potassium 3.5 - 5.1 mEq/L 4.2 4.3 4.4  Chloride 101 - 111 mmol/L - - 101  CO2 22 - 29 mEq/L '22 25 24  ' Calcium 8.4 - 10.4 mg/dL 9.4 9.3 9.2  Total Protein 6.4 - 8.3 g/dL 7.4 7.6 -  Total Bilirubin 0.20 - 1.20 mg/dL 0.53 0.53 -  Alkaline Phos 40 - 150 U/L 81 61 -  AST 5 - 34 U/L 11 14 -  ALT 0 - 55 U/L 14 12 -   PATHOLOGY REPORT  Diagnosis 02/17/2016 1. Breast, lumpectomy, Left Breast - DUCTAL CARCINOMA IN SITU, LOW GRADE, SPANNING LESS THAN 0.1 CM. - DUCTAL CARCINOMA IN SITU IS FOCALLY 0.1 CM TO THE INFERIOR MARGIN OF SPECIMEN #1. - SEE ONCOLOGY TABLE BELOW. 2. Breast, excision, Left Superior Margin - LOBULAR NEOPLASIA (ATYPICAL LOBULAR HYPERPLASIA). - SEE COMMENT. 3. Breast, excision, Left Lateral Margin - BENIGN BREAST PARENCHYMA. - THERE IS NO EVIDENCE OF MALIGNANCY. - SEE COMMENT. Microscopic Comment 1. BREAST, IN SITU CARCINOMA Specimen, including laterality: Left breast. Procedure (include lymph node sampling sentinel-non-sentinel): Lumpectomy and additional superior and lateral margin resections. Grade of carcinoma: Low  grade. Necrosis: Not identified. Estimated tumor size: (glass slide measurement): Less than 0.1 cm. Treatment effect: [name]. Distance to closest margin: Focally 0.1 cm to the inferior  margin of specimen #1. Breast prognostic profile: [id number]. Estrogen receptor: 100%, strong staining intensity. Progesterone receptor: 5%, strong staining intensity. Lymph nodes: None examined. TNM: pTis, pNX.  Diagnosis 10/22/2016 Breast, left, needle core biopsy, lower inner quadrant - DUCTAL CARCINOMA IN SITU WITH CALCIFICATIONS AND COMEDONECROSIS. - SEE COMMENT.  As sampled, the ductal carcinoma in situ is intermediate to high grade. A quantitative estrogen receptor and progesterone receptor will be performed and reported in an addendum to follow. The findings are called to the [facility] on 10/24/15. Dr. Saralyn Pilar has seen this case in consultation with agreement. (RAH:gt, 10/24/15) Results: IMMUNOHISTOCHEMICAL AND MORPHOMETRIC ANALYSIS PERFORMED MANUALLY Estrogen Receptor: 100%, POSITIVE, STRONG STAINING INTENSITY Progesterone Receptor: 5%, POSITIVE, STRONG STAINING INTENSITY  Diagnosis 12/25/2015 Breast, left, needle core biopsy, lower inner quadrant, mid breast - ATYPICAL DUCTAL HYPERPLASIA WITH CALCIFICATIONS.   RADIOGRAPHIC STUDIES: I have personally reviewed the radiological images as listed and agreed with the findings in the report.  MR breast b/l w wo contrast 11/13/2015 IMPRESSION: 1. Abnormal non masslike enhancement in the left breast is seen in 2 locations consistent with the previous to biopsy locations. The most extensive area is in the middle third of the left breast, lower outer quadrant, covering 4.3 x 1.6 x 1.4 cm. The smaller area is along the far posterior lower inner quadrant covering 2.6 x 1.0 x 1.0 cm. 2. No other abnormal enhancement is seen to suggest additional areas of atypia or malignancy. 3. Normal appearance of the right breast. No evidence of  adenopathy.  RECOMMENDATION: 1. Surgical excision of the area of DCIS central lower outer aspect of the left breast. 2. Surgical excision should be considered for the additional area of non mass enhancement along the posterior lower inner quadrant of the left breast, where the previous biopsy demonstrated atypical lobular Hyperplasia.  Diagnostic mammogram left 10/09/2015 IMPRESSION: The 3.0 cm loosely grouped fine punctate calcifications in the lower inner left breast are suspicious, and warrant biopsy.  RECOMMENDATION: Stereotactic biopsy is recommended for the left breast calcifications.    ASSESSMENT & PLAN:  51 y.o. perimenopausal woman, presented with screening discovered DCIS.  1. Breast cancer of lower-outer outer quadrant of left female breast, DCIS, intermediate to high grade, ER+ /PR+ -I previously discussed her breast imaging, needle biopsy and surgical path results with patient in great detail. -She is s/p left breast lumpectomy and adjuvant radiation. -Given her family history of breast cancer and her young age,  we recommend her to undergo genetic testing to ruled out inheritable breast cancer, she will think about it, she is concerned about her insurance coverage for genetic testing. -Her DCIS has been cured by complete surgical resection. Any form of adjuvant therapy is preventive. -Patient is taking tamoxifen for breast cancer prevention. she has been tolerating well, we'll continue for total of 5 years -We will continue breast cancer surveillance, Including annual mammogram, breast exam every 6-12 months, and self exam.  -We discussed moving her office visits to once a year since her insurance is not covering office visits anymore.  -Her last mammogram was in Feb 2017. She is due, I will order it.   2. HTN, DM, CHF -She'll continue follow-up with her primary care physician  Plan -Continue tamoxifen. Refilled today.  -Ordered mammogram for February  2018 -I'll see her back in 1 year unless her insurance allows her to come back sooner.   All questions were answered. The patient knows to call the clinic with any problems, questions or concerns.  I spent 20 minutes counseling the patient face to face. The total time spent in the appointment was 25 minutes and more than 50% was on counseling.   This document serves as a record of services personally performed by Truitt Merle, MD. It was created on her behalf by Martinique Casey, a trained medical scribe. The creation of this record is based on the scribe's personal observations and the provider's statements to them. This document has been checked and approved by the attending provider.  I have reviewed the above documentation for accuracy and completeness, and I agree with the above information.       Truitt Merle, MD 12/15/2016 10:02 AM

## 2016-12-15 ENCOUNTER — Ambulatory Visit (HOSPITAL_BASED_OUTPATIENT_CLINIC_OR_DEPARTMENT_OTHER): Payer: PRIVATE HEALTH INSURANCE | Admitting: Hematology

## 2016-12-15 ENCOUNTER — Other Ambulatory Visit (HOSPITAL_BASED_OUTPATIENT_CLINIC_OR_DEPARTMENT_OTHER): Payer: PRIVATE HEALTH INSURANCE

## 2016-12-15 ENCOUNTER — Telehealth: Payer: Self-pay | Admitting: Hematology

## 2016-12-15 ENCOUNTER — Encounter: Payer: Self-pay | Admitting: Hematology

## 2016-12-15 VITALS — BP 162/85 | HR 84 | Temp 98.2°F | Resp 18 | Ht 65.0 in | Wt 193.8 lb

## 2016-12-15 DIAGNOSIS — I509 Heart failure, unspecified: Secondary | ICD-10-CM | POA: Diagnosis not present

## 2016-12-15 DIAGNOSIS — D0512 Intraductal carcinoma in situ of left breast: Secondary | ICD-10-CM | POA: Diagnosis not present

## 2016-12-15 DIAGNOSIS — I10 Essential (primary) hypertension: Secondary | ICD-10-CM

## 2016-12-15 DIAGNOSIS — I5023 Acute on chronic systolic (congestive) heart failure: Secondary | ICD-10-CM

## 2016-12-15 DIAGNOSIS — Z79810 Long term (current) use of selective estrogen receptor modulators (SERMs): Secondary | ICD-10-CM

## 2016-12-15 DIAGNOSIS — Z170 Estrogen receptor positive status [ER+]: Secondary | ICD-10-CM

## 2016-12-15 DIAGNOSIS — E119 Type 2 diabetes mellitus without complications: Secondary | ICD-10-CM | POA: Diagnosis not present

## 2016-12-15 DIAGNOSIS — C50512 Malignant neoplasm of lower-outer quadrant of left female breast: Secondary | ICD-10-CM

## 2016-12-15 LAB — COMPREHENSIVE METABOLIC PANEL
ALBUMIN: 3.9 g/dL (ref 3.5–5.0)
ALK PHOS: 81 U/L (ref 40–150)
ALT: 14 U/L (ref 0–55)
ANION GAP: 11 meq/L (ref 3–11)
AST: 11 U/L (ref 5–34)
BILIRUBIN TOTAL: 0.53 mg/dL (ref 0.20–1.20)
BUN: 9.9 mg/dL (ref 7.0–26.0)
CALCIUM: 9.4 mg/dL (ref 8.4–10.4)
CO2: 22 mEq/L (ref 22–29)
Chloride: 102 mEq/L (ref 98–109)
Creatinine: 0.9 mg/dL (ref 0.6–1.1)
EGFR: 85 mL/min/{1.73_m2} — AB (ref >90)
GLUCOSE: 376 mg/dL — AB (ref 70–140)
Potassium: 4.2 mEq/L (ref 3.5–5.1)
Sodium: 136 mEq/L (ref 136–145)
TOTAL PROTEIN: 7.4 g/dL (ref 6.4–8.3)

## 2016-12-15 LAB — CBC WITH DIFFERENTIAL/PLATELET
BASO%: 0.8 % (ref 0.0–2.0)
Basophils Absolute: 0 10*3/uL (ref 0.0–0.1)
EOS ABS: 0.1 10*3/uL (ref 0.0–0.5)
EOS%: 3.3 % (ref 0.0–7.0)
HCT: 42.2 % (ref 34.8–46.6)
HEMOGLOBIN: 14.2 g/dL (ref 11.6–15.9)
LYMPH%: 34.8 % (ref 14.0–49.7)
MCH: 26.7 pg (ref 25.1–34.0)
MCHC: 33.6 g/dL (ref 31.5–36.0)
MCV: 79.4 fL — AB (ref 79.5–101.0)
MONO#: 0.3 10*3/uL (ref 0.1–0.9)
MONO%: 9.9 % (ref 0.0–14.0)
NEUT%: 51.2 % (ref 38.4–76.8)
NEUTROS ABS: 1.7 10*3/uL (ref 1.5–6.5)
Platelets: 219 10*3/uL (ref 145–400)
RBC: 5.31 10*6/uL (ref 3.70–5.45)
RDW: 14.2 % (ref 11.2–14.5)
WBC: 3.2 10*3/uL — ABNORMAL LOW (ref 3.9–10.3)
lymph#: 1.1 10*3/uL (ref 0.9–3.3)

## 2016-12-15 MED ORDER — TAMOXIFEN CITRATE 20 MG PO TABS: 20.0000 mg | ORAL_TABLET | Freq: Every day | ORAL | 11 refills | Status: DC

## 2016-12-15 NOTE — Telephone Encounter (Signed)
Pt confirmed appt and received avs °

## 2017-01-04 ENCOUNTER — Ambulatory Visit
Admission: RE | Admit: 2017-01-04 | Discharge: 2017-01-04 | Disposition: A | Discharge status: Home / Self Care | Payer: PRIVATE HEALTH INSURANCE | Source: Ambulatory Visit | Attending: Hematology | Admitting: Hematology

## 2017-01-04 DIAGNOSIS — C50512 Malignant neoplasm of lower-outer quadrant of left female breast: Secondary | ICD-10-CM

## 2017-01-04 DIAGNOSIS — Z170 Estrogen receptor positive status [ER+]: Principal | ICD-10-CM

## 2017-01-04 HISTORY — DX: Personal history of irradiation: Z92.3

## 2017-04-23 ENCOUNTER — Other Ambulatory Visit: Payer: Self-pay | Admitting: Cardiology

## 2017-09-20 ENCOUNTER — Other Ambulatory Visit: Payer: Self-pay | Admitting: Hematology

## 2017-10-31 ENCOUNTER — Other Ambulatory Visit: Payer: Self-pay | Admitting: Cardiology

## 2017-10-31 DIAGNOSIS — I5023 Acute on chronic systolic (congestive) heart failure: Secondary | ICD-10-CM

## 2017-11-03 ENCOUNTER — Other Ambulatory Visit: Payer: Self-pay | Admitting: Cardiology

## 2017-11-03 DIAGNOSIS — I5023 Acute on chronic systolic (congestive) heart failure: Secondary | ICD-10-CM

## 2017-11-03 NOTE — Telephone Encounter (Signed)
Rx request sent to pharmacy.  

## 2017-11-29 ENCOUNTER — Other Ambulatory Visit: Payer: Self-pay | Admitting: Family Medicine

## 2017-11-29 ENCOUNTER — Other Ambulatory Visit: Payer: Self-pay | Admitting: Hematology

## 2017-11-29 DIAGNOSIS — Z9889 Other specified postprocedural states: Secondary | ICD-10-CM

## 2017-12-13 NOTE — Progress Notes (Signed)
[place]  Telephone:([phone]) [phone] Fax:(336) [phone]  Clinic follow Up Note   Patient Care Team: Lin Landsman, MD as PCP - General (Family Medicine) 12/15/2017   CHIEF COMPLAINTS:  Left breast cancer DCIS   Oncology History   Breast cancer of lower-outer quadrant of left female breast [facility])   Staging form: Breast, AJCC 7th Edition     Clinical: Stage 0 (Tis (DCIS), N0, M0) - Unsigned     Pathologic stage from 02/17/2016: Stage 0 (Tis (DCIS), N0, cM0) - Signed by Truitt Merle, MD on 03/08/2016       Breast cancer of lower-outer quadrant of left female breast ([place])   09/12/2015 Mammogram    Screening mammogram showed calcification in left breast       10/09/2015 Mammogram    diagnostoc mammo and US showed 3.0cm  loosely grouped calcification in the lower inner left breast are suspicious       10/23/2015 Initial Diagnosis    Breast cancer of lower-outer quadrant of left female breast ([place])      10/23/2015 Initial Biopsy    left breast biopsy showed DCIS and comedonecrosis       10/23/2015 Receptors her2    ER 100%+, PR 5%+      11/13/2015 Imaging    Breast MRI showed known masslike enhancement in the left breast in 2 locations, measuring 4.3 x 1.6 x 1.4 cm, and 2.6 x 1.0 x 1.0 cm. Normal appearance of the right breast. No evidence of adenopathy.      01/24/2016 Imaging    MR breast b/l w wo contrast 01/24/2016 IMPRESSION: 1. Significantly less non mass enhancement in the 2 areas of the left breast previously identified. There is residual stippled non mass enhancement between the tissue marker clips in the lower inner quadrant and slight lower outer quadrant spanning approximately 3.8 cm (between the 2 coil shaped tissue marker clips, distance between the clips on the MRI approximating 2.6 cm, approximately 5 cm apart on the CC mammogram with compression). This corresponds to the biopsy-proven foci of DCIS and ADH. 2. Residual non mass enhancement in the  lower inner quadrant, posterior depth (biopsy-proven ALH) spanning approximately 2.4 cm. 3. No new abnormalities involving either breast. 4. No pathologic lymphadenopathy.      02/17/2016 Surgery    left breast lumpectomy      02/17/2016 Pathology Results    left breast lumpectomy showed low grade DCIS, less than 0.1cm, (+) ALH, DCIS focally 0.1cm to the inferior margin      03/23/2016 - 05/07/2016 Radiation Therapy    Left breast radiation      05/30/2016 -  Anti-estrogen oral therapy    Tamoxifen 72m daily       01/04/2017 Mammogram    IMPRESSION: No evidence of malignancy in either breast.       HISTORY OF PRESENTING ILLNESS:  LCarlyle Lipa52y.o. female is here because of Her recently diagnosed left breast cancer. She presents to the clinic by herself.  This was discovered by screening mammogram in October 2016. The diagnostic mammogram and ultrasound showed a 3 cm grouped calcifications in the lower inner left breast, MRI of the breast showed no mass like enhancement in the left breast in 2 locations, measuring 4.3 cm and 2.6 cm. She underwent needle biopsy twice, one showed DCIS, the second one showed a typical ductal hyperplasia. She underwent left breast lumpectomy on 02/17/2016, tolerated well. She has recently completed adjuvant breast radiation one week ago.  She tolerated radiation very well also.  She feels well, denies any significant pain, or other discomfort. She is physically active, has mild arthralgia from arthritis, no other complaints. Her menstrual period has been irregular in the past year, less frequent than before, her last period was in May 2017.   CURRENT THERAPY: Tamoxifen 53m daily started on 05/30/2016   INTERIM HISTORY: LDAYLEE DELAHOZreturns for follow up. She reports she is doing well overall. She is tolerating Tamoxifen without complaints. Her next mammogram is scheduled for 01/05/18. She reports she is trying to work on her blood glucose levels.  She reports she has been diabetic for 18 years. She has not noticed any change in her glucose levels since starting Tamoxifen. She notes she has not had a period in 8-10 months.   On review of systems, pt denies breast pain, or any other complaints at this time. Pertinent positives are listed and detailed within the above HPI.   MEDICAL HISTORY:  Past Medical History:  Diagnosis Date  . Anxiety   . 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 Dental crown present   . 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 History of congestive heart failure 10/2014  . Hypertension    states under control with meds., has been on med. at least 119yr.  . Insulin dependent diabetes mellitus ([place]   . Nonischemic cardiomyopathy ([place]   . Personal history of radiation therapy 2016   left    SURGICAL HISTORY: Past Surgical History:  Procedure Laterality Date  . BREAST LUMPECTOMY WITH NEEDLE LOCALIZATION Left 02/17/2016   Procedure: LEFT BREAST LUMPECTOMY WITH BRACKETED NEEDLE LOCALIZATION;  Surgeon: BExcell Seltzer MD;  [facility]  Service: General;  Laterality: Left;  . EXCISION / BIOPSY BREAST / NIPPLE / DUCT Left 02/17/2016   lumpectomy 2017  . KNEE ARTHROSCOPY WITH ANTERIOR CRUCIATE LIGAMENT (ACL) REPAIR Right   . LEFT AND RIGHT HEART CATHETERIZATION WITH CORONARY ANGIOGRAM [name] 10/08/2014   Procedure: LEFT AND RIGHT HEART CATHETERIZATION WITH CORONARY ANGIOGRAM;  Surgeon: JJettie Booze MD;  [facility];  Service: Cardiovascular;  Laterality: [name];  . SHOULDER ARTHROSCOPY W/ ROTATOR CUFF REPAIR Right     SOCIAL HISTORY: Social History   Socioeconomic History  . Marital status: Married    Spouse name: Not on file  . Number of children: 1  . Years of education: Not on file  . Highest education level: Not on file  Social Needs  . Financial resource strain: Not on file  . Food insecurity - worry: Not on file  . Food insecurity -  inability: Not on file  . Transportation needs - medical: Not on file  . Transportation needs - non-medical: Not on file  Occupational History    Comment: PChiropodist Tobacco Use  . Smoking status: Never Smoker  . Smokeless tobacco: Never Used  Substance and Sexual Activity  . Alcohol use: Yes    Alcohol/week: 0.6 oz    Types: 1 Glasses of wine per week    Comment: occasionally  . Drug use: No  . Sexual activity: Yes  Other Topics Concern  . Not on file  Social History Narrative  . Not on file    FAMILY HISTORY: Family History  Problem Relation Age of Onset  . Diabetes Mother   . Hypertension Mother   . Cancer Mother 656      colon  . Hypertension Father   .  Diabetes Father   . Cancer Father 42       prostate cancer   . Heart disease Unknown        No family history  . Cancer Paternal Aunt        breast cancer   . Cancer Paternal Aunt 38       ovarian cancer     ALLERGIES:  is allergic to bidil [isosorb dinitrate-hydralazine].  MEDICATIONS:  Current Outpatient Medications  Medication Sig Dispense Refill  . aspirin EC 81 MG tablet Take 1 tablet (81 mg total) by mouth daily. 30 tablet 2  . carvedilol (COREG) 12.5 MG tablet take 1 tablet by mouth twice a day with meals 180 tablet 1  . furosemide (LASIX) 80 MG tablet Take 80 mg by mouth as needed. For weight gain    . LANTUS SOLOSTAR 100 UNIT/ML Solostar Pen Inject 80 Units into the skin at bedtime.   0  . lisinopril (PRINIVIL,ZESTRIL) 40 MG tablet Take 40 mg by mouth daily.  0  . Multiple Vitamin (MULTIVITAMIN) tablet Take 1 tablet by mouth daily.    Marland Kitchen spironolactone (ALDACTONE) 25 MG tablet Take 1 tablet (25 mg total) by mouth daily. Please schedule appointment for refills. 30 tablet 2  . tamoxifen (NOLVADEX) 20 MG tablet Take 1 tablet (20 mg total) by mouth daily. 30 tablet 11  . LEVEMIR FLEXTOUCH 100 UNIT/ML Pen Inject 25 Units into the skin at bedtime.  0   No current facility-administered medications for  this visit.     REVIEW OF SYSTEMS:   Constitutional: Denies fevers, chills or abnormal night sweats Eyes: Denies blurriness of vision, double vision or watery eyes Ears, nose, mouth, throat, and face: Denies mucositis or sore throat Respiratory: Denies cough, dyspnea or wheezes Cardiovascular: Denies palpitation, chest discomfort or lower extremity swelling Gastrointestinal:  Denies nausea, heartburn or change in bowel habits Skin: Denies abnormal skin rashes Lymphatics: Denies new lymphadenopathy or easy bruising Neurological:Denies numbness, tingling or new weaknesses Behavioral/Psych: Mood is stable, no new changes  All other systems were reviewed with the patient and are negative.  PHYSICAL EXAMINATION: ECOG PERFORMANCE STATUS: 0 - Asymptomatic  Vitals:   12/15/17 0845  BP: (!) 151/81  Pulse: 79  Resp: 18  Temp: 98.2 F (36.8 C)  SpO2: 99%   Filed Weights   12/15/17 0845  Weight: 195 lb (88.5 kg)   GENERAL:alert, no distress and comfortable SKIN: skin color, texture, turgor are normal, no rashes or significant lesions EYES: normal, conjunctiva are pink and non-injected, sclera clear OROPHARYNX:no exudate, no erythema and lips, buccal mucosa, and tongue normal  NECK: supple, thyroid normal size, non-tender, without nodularity LYMPH:  no palpable lymphadenopathy in the cervical, axillary or inguinal LUNGS: clear to auscultation and percussion with normal breathing effort HEART: regular rate & rhythm and no murmurs and no lower extremity edema ABDOMEN:abdomen soft, non-tender and normal bowel sounds Musculoskeletal:no cyanosis of digits and no clubbing  PSYCH: alert & oriented x 3 with fluent speech NEURO: no focal motor/sensory deficits Breasts: Breast inspection showed them to be symmetrical with no nipple discharge. (+) Diffuse skin pigmentation of the left breast from radiation, skin is wet and well healed. Palpation of the breasts and axilla revealed no obvious mass  that I could appreciate.    LABORATORY DATA:  I have reviewed the data as listed CBC Latest Ref Rng & Units 12/15/2017 12/15/2016 07/10/2016  WBC 3.9 - 10.3 K/uL 3.3(L) 3.2(L) 3.1(L)  Hemoglobin 11.6 - 15.9 g/dL 13.4  14.2 14.3  Hematocrit 34.8 - 46.6 % 40.4 42.2 44.6  Platelets 145 - 400 K/uL 239 219 237   CMP Latest Ref Rng & Units 12/15/2017 12/15/2016 07/10/2016  Glucose 70 - 140 mg/dL 330(H) 376(H) 196(H)  BUN 7 - 26 mg/dL 10 9.9 9.7  Creatinine 0.60 - 1.10 mg/dL 0.80 0.9 0.8  Sodium 136 - 145 mmol/L 136 136 141  Potassium 3.3 - 4.7 mmol/L 4.2 4.2 4.3  Chloride 98 - 109 mmol/L 103 - -  CO2 22 - 29 mmol/L '22 22 25  ' Calcium 8.4 - 10.4 mg/dL 8.9 9.4 9.3  Total Protein 6.4 - 8.3 g/dL 6.9 7.4 7.6  Total Bilirubin 0.2 - 1.2 mg/dL 0.4 0.53 0.53  Alkaline Phos 40 - 150 U/L 92 81 61  AST 5 - 34 U/L '11 11 14  ' ALT 0 - 55 U/L '9 14 12   ' PATHOLOGY REPORT  Diagnosis 02/17/2016 1. Breast, lumpectomy, Left Breast - DUCTAL CARCINOMA IN SITU, LOW GRADE, SPANNING LESS THAN 0.1 CM. - DUCTAL CARCINOMA IN SITU IS FOCALLY 0.1 CM TO THE INFERIOR MARGIN OF SPECIMEN #1. - SEE ONCOLOGY TABLE BELOW. 2. Breast, excision, Left Superior Margin - LOBULAR NEOPLASIA (ATYPICAL LOBULAR HYPERPLASIA). - SEE COMMENT. 3. Breast, excision, Left Lateral Margin - BENIGN BREAST PARENCHYMA. - THERE IS NO EVIDENCE OF MALIGNANCY. - SEE COMMENT. Microscopic Comment 1. BREAST, IN SITU CARCINOMA Specimen, including laterality: Left breast. Procedure (include lymph node sampling sentinel-non-sentinel): Lumpectomy and additional superior and lateral margin resections. Grade of carcinoma: Low grade. Necrosis: Not identified. Estimated tumor size: (glass slide measurement): Less than 0.1 cm. Treatment effect: [name]. Distance to closest margin: Focally 0.1 cm to the inferior margin of specimen #1. Breast prognostic profile: [id number]. Estrogen receptor: 100%, strong staining intensity. Progesterone receptor: 5%, strong  staining intensity. Lymph nodes: None examined. TNM: pTis, pNX.  Diagnosis 10/22/2016 Breast, left, needle core biopsy, lower inner quadrant - DUCTAL CARCINOMA IN SITU WITH CALCIFICATIONS AND COMEDONECROSIS. - SEE COMMENT.  As sampled, the ductal carcinoma in situ is intermediate to high grade. A quantitative estrogen receptor and progesterone receptor will be performed and reported in an addendum to follow. The findings are called to the [place] on 10/24/15. Dr. Saralyn Pilar has seen this case in consultation with agreement. (RAH:gt, 10/24/15) Results: IMMUNOHISTOCHEMICAL AND MORPHOMETRIC ANALYSIS PERFORMED MANUALLY Estrogen Receptor: 100%, POSITIVE, STRONG STAINING INTENSITY Progesterone Receptor: 5%, POSITIVE, STRONG STAINING INTENSITY  Diagnosis 12/25/2015 Breast, left, needle core biopsy, lower inner quadrant, mid breast - ATYPICAL DUCTAL HYPERPLASIA WITH CALCIFICATIONS.   RADIOGRAPHIC STUDIES: I have personally reviewed the radiological images as listed and agreed with the findings in the report.  Diagnostic Mammogram 01/04/17 IMPRESSION: No evidence of malignancy in either breast.  MR breast b/l w wo contrast 01/24/2016 IMPRESSION: 1. Significantly less non mass enhancement in the 2 areas of the left breast previously identified. There is residual stippled non mass enhancement between the tissue marker clips in the lower inner quadrant and slight lower outer quadrant spanning approximately 3.8 cm (between the 2 coil shaped tissue marker clips, distance between the clips on the MRI approximating 2.6 cm, approximately 5 cm apart on the CC mammogram with compression). This corresponds to the biopsy-proven foci of DCIS and ADH. 2. Residual non mass enhancement in the lower inner quadrant, posterior depth (biopsy-proven ALH) spanning approximately 2.4 cm. 3. No new abnormalities involving either breast. 4. No pathologic lymphadenopathy.  MR breast b/l w wo  contrast 11/13/2015 IMPRESSION: 1. Abnormal non masslike enhancement in  the left breast is seen in 2 locations consistent with the previous to biopsy locations. The most extensive area is in the middle third of the left breast, lower outer quadrant, covering 4.3 x 1.6 x 1.4 cm. The smaller area is along the far posterior lower inner quadrant covering 2.6 x 1.0 x 1.0 cm. 2. No other abnormal enhancement is seen to suggest additional areas of atypia or malignancy. 3. Normal appearance of the right breast. No evidence of adenopathy.   Diagnostic mammogram left 10/09/2015 IMPRESSION: The 3.0 cm loosely grouped fine punctate calcifications in the lower inner left breast are suspicious, and warrant biopsy.    ASSESSMENT & PLAN:  52 y.o. perimenopausal woman, presented with screening discovered DCIS.  1. Breast cancer of lower-outer outer quadrant of left female breast, DCIS, intermediate to high grade, ER+ /PR+ -I previously discussed her breast imaging, needle biopsy and surgical path results with patient in great detail. -She is s/p left breast lumpectomy and adjuvant radiation. -Given her family history of breast cancer and her young age,  we recommend her to undergo genetic testing to ruled out inheritable breast cancer, she will think about it, she is concerned about her insurance coverage for genetic testing. -Her DCIS has been cured by complete surgical resection. Any form of adjuvant therapy is preventive. -Patient is taking tamoxifen for breast cancer prevention.  -We will continue breast cancer surveillance, Including annual mammogram, breast exam every 6-12 months, and self exam.  -Mammogram from 2018 is without concern, her exam is unremarkable, labs were reviewed today (12/15/17). No clinical concern for recurrence -She is tolerating tamoxifen very well, will continue for total of 5 years. -F/u in 1 year  2. HTN, DM, CHF -She'll continue follow-up with her primary care  physician -I discussed that Tamoxifen can influence her blood glucose levels and that she should continue to monitor this closely. I encouraged her to watch her diet and to be more active  3. Mild leukopenia -She has developed a mild leukopenia since she started tamoxifen, possible medication related -She has not had recurrent infection, will continue monitoring.  Plan -Continue tamoxifen. Refilled today.  -Mammogram schedule for Feb/2019 -I'll see her back in 1 year   All questions were answered. The patient knows to call the clinic with any problems, questions or concerns.  I spent 15 minutes counseling the patient face to face. The total time spent in the appointment was 20 minutes and more than 50% was on counseling.  This document serves as a record of services personally performed by Truitt Merle, MD. It was created on her behalf by Theresia Bough, a trained medical scribe. The creation of this record is based on the scribe's personal observations and the provider's statements to them.   I have reviewed the above documentation for accuracy and completeness, and I agree with the above.       Truitt Merle, MD 12/15/2017 9:29 AM

## 2017-12-15 ENCOUNTER — Encounter: Payer: Self-pay | Admitting: Hematology

## 2017-12-15 ENCOUNTER — Inpatient Hospital Stay: Payer: BLUE CROSS/BLUE SHIELD | Attending: Hematology | Admitting: Hematology

## 2017-12-15 ENCOUNTER — Telehealth: Payer: Self-pay | Admitting: Hematology

## 2017-12-15 ENCOUNTER — Inpatient Hospital Stay: Payer: BLUE CROSS/BLUE SHIELD

## 2017-12-15 VITALS — BP 151/81 | HR 79 | Temp 98.2°F | Resp 18 | Ht 65.0 in | Wt 195.0 lb

## 2017-12-15 DIAGNOSIS — C50512 Malignant neoplasm of lower-outer quadrant of left female breast: Secondary | ICD-10-CM

## 2017-12-15 DIAGNOSIS — D0512 Intraductal carcinoma in situ of left breast: Secondary | ICD-10-CM

## 2017-12-15 DIAGNOSIS — E119 Type 2 diabetes mellitus without complications: Secondary | ICD-10-CM

## 2017-12-15 DIAGNOSIS — Z79810 Long term (current) use of selective estrogen receptor modulators (SERMs): Secondary | ICD-10-CM

## 2017-12-15 DIAGNOSIS — I509 Heart failure, unspecified: Secondary | ICD-10-CM

## 2017-12-15 DIAGNOSIS — Z170 Estrogen receptor positive status [ER+]: Secondary | ICD-10-CM | POA: Diagnosis not present

## 2017-12-15 DIAGNOSIS — D72819 Decreased white blood cell count, unspecified: Secondary | ICD-10-CM

## 2017-12-15 DIAGNOSIS — I10 Essential (primary) hypertension: Secondary | ICD-10-CM

## 2017-12-15 DIAGNOSIS — Z794 Long term (current) use of insulin: Secondary | ICD-10-CM

## 2017-12-15 LAB — COMPREHENSIVE METABOLIC PANEL
ALT: 9 U/L (ref 0–55)
ANION GAP: 11 (ref 3–11)
AST: 11 U/L (ref 5–34)
Albumin: 3.6 g/dL (ref 3.5–5.0)
Alkaline Phosphatase: 92 U/L (ref 40–150)
BUN: 10 mg/dL (ref 7–26)
CALCIUM: 8.9 mg/dL (ref 8.4–10.4)
CHLORIDE: 103 mmol/L (ref 98–109)
CO2: 22 mmol/L (ref 22–29)
Creatinine, Ser: 0.8 mg/dL (ref 0.60–1.10)
GFR calc non Af Amer: >60 mL/min (ref >60)
Glucose, Bld: 330 mg/dL — ABNORMAL HIGH (ref 70–140)
Potassium: 4.2 mmol/L (ref 3.3–4.7)
SODIUM: 136 mmol/L (ref 136–145)
Total Bilirubin: 0.4 mg/dL (ref 0.2–1.2)
Total Protein: 6.9 g/dL (ref 6.4–8.3)

## 2017-12-15 LAB — CBC WITH DIFFERENTIAL/PLATELET
BASOS PCT: 0 %
Basophils Absolute: 0 10*3/uL (ref 0.0–0.1)
Eosinophils Absolute: 0.1 10*3/uL (ref 0.0–0.5)
Eosinophils Relative: 4 %
HEMATOCRIT: 40.4 % (ref 34.8–46.6)
HEMOGLOBIN: 13.4 g/dL (ref 11.6–15.9)
LYMPHS ABS: 1.1 10*3/uL (ref 0.9–3.3)
Lymphocytes Relative: 33 %
MCH: 26.3 pg (ref 25.1–34.0)
MCHC: 33.2 g/dL (ref 31.5–36.0)
MCV: 79.4 fL — ABNORMAL LOW (ref 79.5–101.0)
MONOS PCT: 8 %
Monocytes Absolute: 0.3 10*3/uL (ref 0.1–0.9)
NEUTROS ABS: 1.8 10*3/uL (ref 1.5–6.5)
NEUTROS PCT: 55 %
Platelets: 239 10*3/uL (ref 145–400)
RBC: 5.09 MIL/uL (ref 3.70–5.45)
RDW: 14.4 % (ref 11.2–16.1)
WBC: 3.3 10*3/uL — AB (ref 3.9–10.3)

## 2017-12-15 MED ORDER — TAMOXIFEN CITRATE 20 MG PO TABS: 20.0000 mg | ORAL_TABLET | Freq: Every day | ORAL | 11 refills | Status: DC

## 2017-12-15 NOTE — Telephone Encounter (Signed)
Gave avs and calendar for January 2020 °

## 2017-12-28 ENCOUNTER — Encounter: Payer: Self-pay | Admitting: Obstetrics and Gynecology

## 2017-12-28 ENCOUNTER — Other Ambulatory Visit (HOSPITAL_COMMUNITY)
Admission: RE | Admit: 2017-12-28 | Discharge: 2017-12-28 | Disposition: A | Discharge status: Home / Self Care | Payer: BLUE CROSS/BLUE SHIELD | Source: Ambulatory Visit | Attending: Obstetrics and Gynecology | Admitting: Obstetrics and Gynecology

## 2017-12-28 ENCOUNTER — Ambulatory Visit (INDEPENDENT_AMBULATORY_CARE_PROVIDER_SITE_OTHER): Payer: BLUE CROSS/BLUE SHIELD | Admitting: Obstetrics and Gynecology

## 2017-12-28 DIAGNOSIS — Z01419 Encounter for gynecological examination (general) (routine) without abnormal findings: Secondary | ICD-10-CM

## 2017-12-28 DIAGNOSIS — Z01411 Encounter for gynecological examination (general) (routine) with abnormal findings: Secondary | ICD-10-CM

## 2017-12-28 DIAGNOSIS — N852 Hypertrophy of uterus: Secondary | ICD-10-CM

## 2017-12-28 NOTE — Progress Notes (Signed)
Kristen Snyder is a 52 y.o. G76P1001 female here for a routine annual gynecologic exam. She has no GYN complaints today. H/O DM and HTN, managed by PCP. H/O Left Breast DCIS, s/p lumpectomy and radiation. Currently takes chemopreventive agent of Tamoxifen. Followed by Heme/Onc. Next mammogram this month. LMP was 03/2016. She does report some menopausal Sx but able to handle. Sexual active without problems.    Gynecologic History Patient's last menstrual period was 03/16/2016 (approximate). Contraception: none Last Pap: 2016. Results were: normal Last mammogram: Pending. Results were: pending  Obstetric History OB History  Gravida Para Term Preterm AB Living  1 1 1     1   SAB TAB Ectopic Multiple Live Births          1    # Outcome Date GA Lbr Len/2nd Weight Sex Delivery Anes PTL Lv  1 Term 1999        LIV      Past Medical History:  Diagnosis Date  . Anxiety   . 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 Dental crown present   . 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 History of congestive heart failure 10/2014  . Hypertension    states under control with meds., has been on med. at least 27 yr.  . Insulin dependent diabetes mellitus ([place])   . Nonischemic cardiomyopathy ([place])   . Personal history of radiation therapy 2016   left    Past Surgical History:  Procedure Laterality Date  . BREAST LUMPECTOMY WITH NEEDLE LOCALIZATION Left 02/17/2016   Procedure: LEFT BREAST LUMPECTOMY WITH BRACKETED NEEDLE LOCALIZATION;  Surgeon: Excell Seltzer, MD;  Location: [place];  Service: General;  Laterality: Left;  . EXCISION / BIOPSY BREAST / NIPPLE / DUCT Left 02/17/2016   lumpectomy 2017  . KNEE ARTHROSCOPY WITH ANTERIOR CRUCIATE LIGAMENT (ACL) REPAIR Right   . LEFT AND RIGHT HEART CATHETERIZATION WITH CORONARY ANGIOGRAM [name] 10/08/2014   Procedure: LEFT AND RIGHT HEART CATHETERIZATION WITH CORONARY ANGIOGRAM;  Surgeon: Jettie Booze,  MD;  Location[facility];  Service: Cardiovascular;  Laterality: [name];  . SHOULDER ARTHROSCOPY W/ ROTATOR CUFF REPAIR Right     Current Outpatient Medications on File Prior to Visit  Medication Sig Dispense Refill  . aspirin EC 81 MG tablet Take 1 tablet (81 mg total) by mouth daily. 30 tablet 2  . carvedilol (COREG) 12.5 MG tablet take 1 tablet by mouth twice a day with meals 180 tablet 1  . furosemide (LASIX) 80 MG tablet Take 80 mg by mouth as needed. For weight gain    . LANTUS SOLOSTAR 100 UNIT/ML Solostar Pen Inject 60 Units into the skin at bedtime.   0  . lisinopril (PRINIVIL,ZESTRIL) 40 MG tablet Take 40 mg by mouth daily.  0  . Multiple Vitamin (MULTIVITAMIN) tablet Take 1 tablet by mouth daily.    Marland Kitchen spironolactone (ALDACTONE) 25 MG tablet Take 1 tablet (25 mg total) by mouth daily. Please schedule appointment for refills. 30 tablet 2  . tamoxifen (NOLVADEX) 20 MG tablet Take 1 tablet (20 mg total) by mouth daily. 30 tablet 11  . LEVEMIR FLEXTOUCH 100 UNIT/ML Pen Inject 25 Units into the skin at bedtime.  0   No current facility-administered medications on file prior to visit.     Allergies  Allergen Reactions  . Bidil [Isosorb Dinitrate-Hydralazine] Other (See Comments)    DISORIENTATION; DIZZINESS    Social History   Socioeconomic History  .  Marital status: Married    Spouse name: Not on file  . Number of children: 1  . Years of education: Not on file  . Highest education level: Not on file  Social Needs  . Financial resource strain: Not on file  . Food insecurity - worry: Not on file  . Food insecurity - inability: Not on file  . Transportation needs - medical: Not on file  . Transportation needs - non-medical: Not on file  Occupational History    Comment: Chiropodist  Tobacco Use  . Smoking status: Never Smoker  . Smokeless tobacco: Never Used  Substance and Sexual Activity  . Alcohol use: No    Alcohol/week: 0.6 oz    Types: 1 Glasses of wine per  week    Frequency: Never    Comment: occasionally  . Drug use: No  . Sexual activity: Yes    Birth control/protection: None  Other Topics Concern  . Not on file  Social History Narrative  . Not on file    Family History  Problem Relation Age of Onset  . Diabetes Mother   . Hypertension Mother   . Cancer Mother 64       colon  . Hypertension Father   . Diabetes Father   . Cancer Father 36       prostate cancer   . Heart disease Unknown        No family history  . Cancer Paternal Aunt        breast cancer   . Cancer Paternal Aunt 78       ovarian cancer     The following portions of the patient's history were reviewed and updated as appropriate: allergies, current medications, past family history, past medical history, past social history, past surgical history and problem list.  Review of Systems Pertinent items noted in HPI and remainder of comprehensive ROS otherwise negative.   Objective:  BP (!) 133/92   Pulse 89   Ht 5\' 5"  (1.651 m)   Wt 192 lb 14.4 oz (87.5 kg)   LMP 03/16/2016 (Approximate)   BMI 32.10 kg/m  CONSTITUTIONAL: Well-developed, well-nourished female in no acute distress.  HENT:  Normocephalic, atraumatic, External right and left ear normal. Oropharynx is clear and moist EYES: Conjunctivae and EOM are normal. Pupils are equal, round, and reactive to light. No scleral icterus.  NECK: Normal range of motion, supple, no masses.  Normal thyroid.  SKIN: Skin is warm and dry. No rash noted. Not diaphoretic. No erythema. No pallor. [place]: Alert and oriented to person, place, and time. Normal reflexes, muscle tone coordination. No cranial nerve deficit noted. PSYCHIATRIC: Normal mood and affect. Normal behavior. Normal judgment and thought content. CARDIOVASCULAR: Normal heart rate noted, regular rhythm RESPIRATORY: Clear to auscultation bilaterally. Effort and breath sounds normal, no problems with respiration noted. BREASTS: Symmetric in size. No  masses. Radiation skin changes noted on left breast, no adenopathy noted.  ABDOMEN: Soft, normal bowel sounds, no distention noted.  No tenderness, rebound or guarding.  PELVIC: Normal appearing external genitalia; normal appearing vaginal mucosa and cervix.  No abnormal discharge noted.  Pap smear obtained.  Uterus enlarged non tender MUSCULOSKELETAL: Normal range of motion. No tenderness.  No cyanosis, clubbing, or edema.  2+ distal pulses.   Assessment:  Annual gynecologic examination with pap smear Enlarged Uterus H/O Breast Cancer   Plan:  Will follow up results of pap smear and manage accordingly. Mammogram scheduled as per Heme/Onc Will check GYN  U/S d/t enlarged uterus. H/O uterine fibroids in the past Routine preventative health maintenance measures emphasized. Please refer to After Visit Summary for other counseling recommendations.    Chancy Milroy, MD, [place] Attending [place] for [facility], [place]

## 2017-12-28 NOTE — Patient Instructions (Signed)

## 2017-12-30 LAB — CYTOLOGY - PAP
Adequacy: ABSENT
Diagnosis: NEGATIVE
HPV (WINDOPATH): NOT DETECTED

## 2018-01-03 ENCOUNTER — Other Ambulatory Visit: Payer: BLUE CROSS/BLUE SHIELD

## 2018-01-05 ENCOUNTER — Ambulatory Visit
Admission: RE | Admit: 2018-01-05 | Discharge: 2018-01-05 | Disposition: A | Discharge status: Home / Self Care | Payer: BLUE CROSS/BLUE SHIELD | Source: Ambulatory Visit | Attending: Hematology | Admitting: Hematology

## 2018-01-05 DIAGNOSIS — Z9889 Other specified postprocedural states: Secondary | ICD-10-CM

## 2018-01-18 ENCOUNTER — Ambulatory Visit
Admission: RE | Admit: 2018-01-18 | Discharge: 2018-01-18 | Disposition: A | Discharge status: Home / Self Care | Payer: BLUE CROSS/BLUE SHIELD | Source: Ambulatory Visit | Attending: Obstetrics and Gynecology | Admitting: Obstetrics and Gynecology

## 2018-01-18 DIAGNOSIS — N852 Hypertrophy of uterus: Secondary | ICD-10-CM

## 2018-02-04 IMAGING — MG MM LT PLC BREAST LOC DEV 1ST LESION
4 series · 4 of 4 positions shown · non-contrast
Comparison: Previous exams.

CLINICAL DATA: The patient presents for two wire localizations of
the left breast. She has a diagnosis of DCIS in the location of a
laterally placed coil biopsy marker (LOQ) and a diagnosis of
atypical ductal hyperplasia at the site of the medially placed coil
shaped biopsy marker (LIQ).

EXAM:
NEEDLE LOCALIZATION OF THE LEFT BREAST WITH MAMMO GUIDANCE X TWO
SITES

[L FB (1 of 3)]
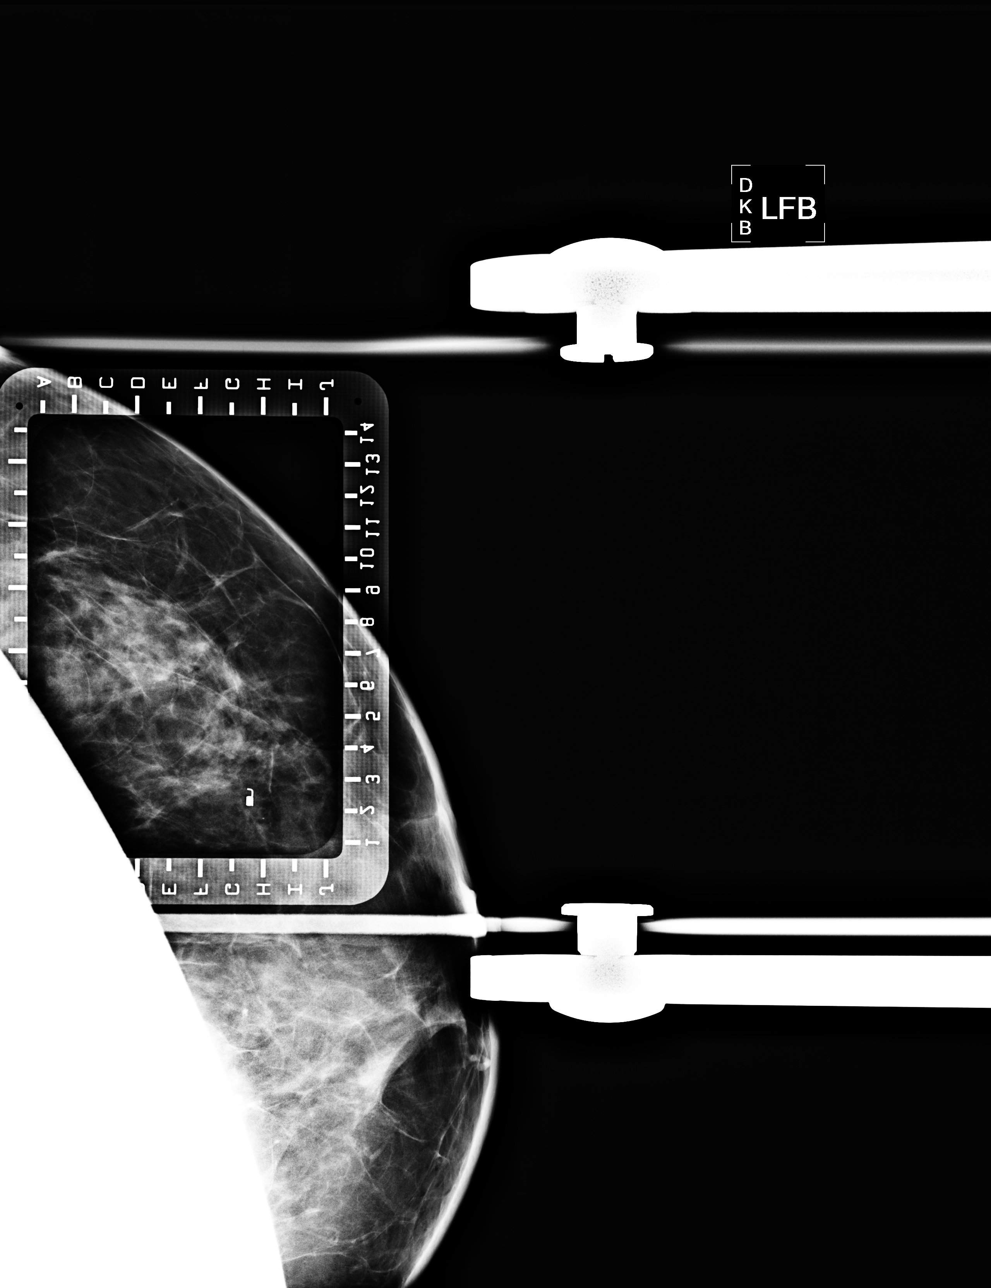

[L FB (2 of 3)]
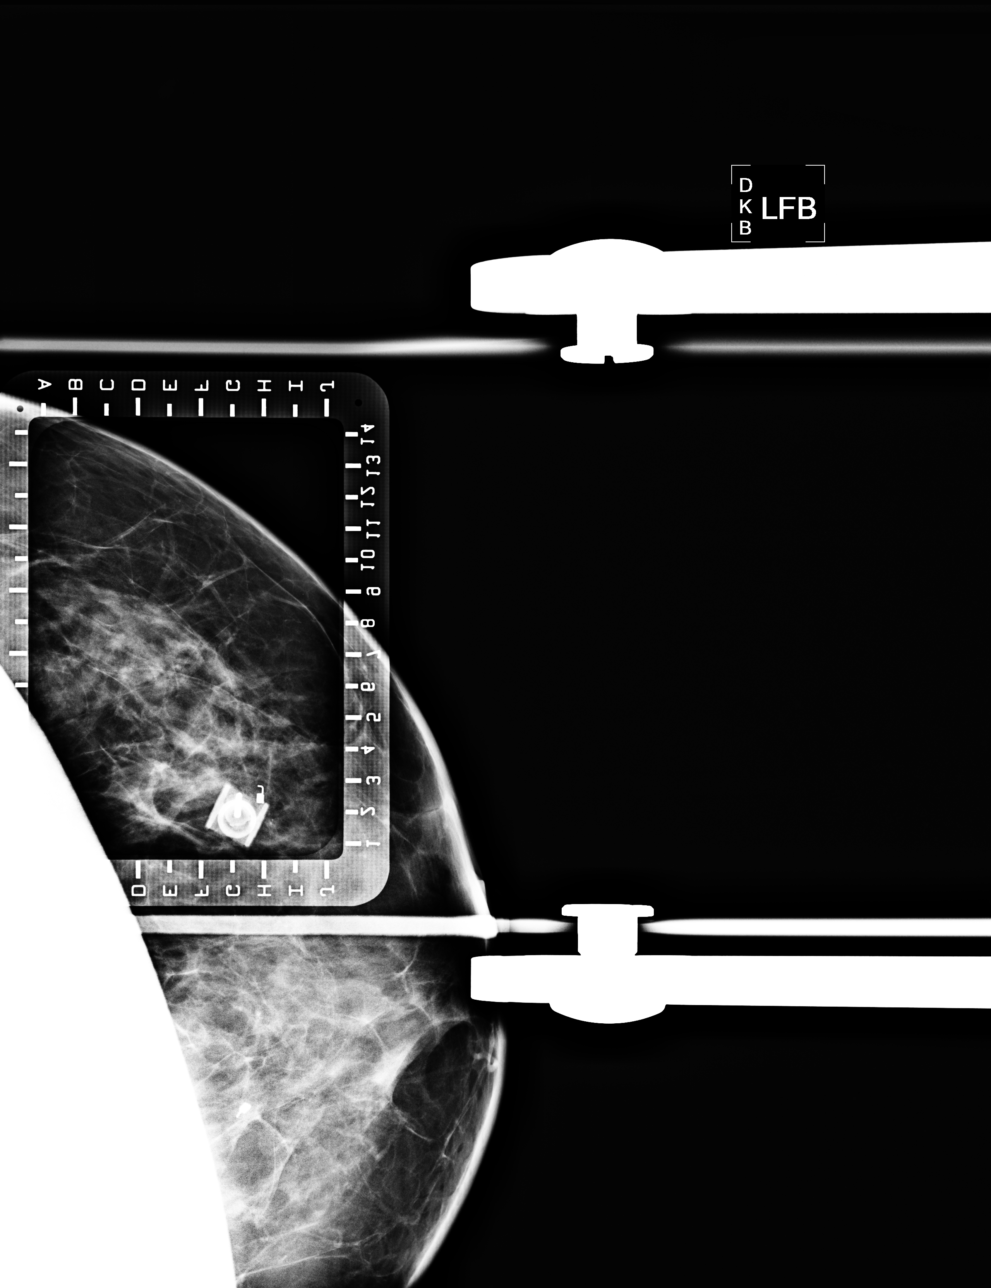

[L ML]
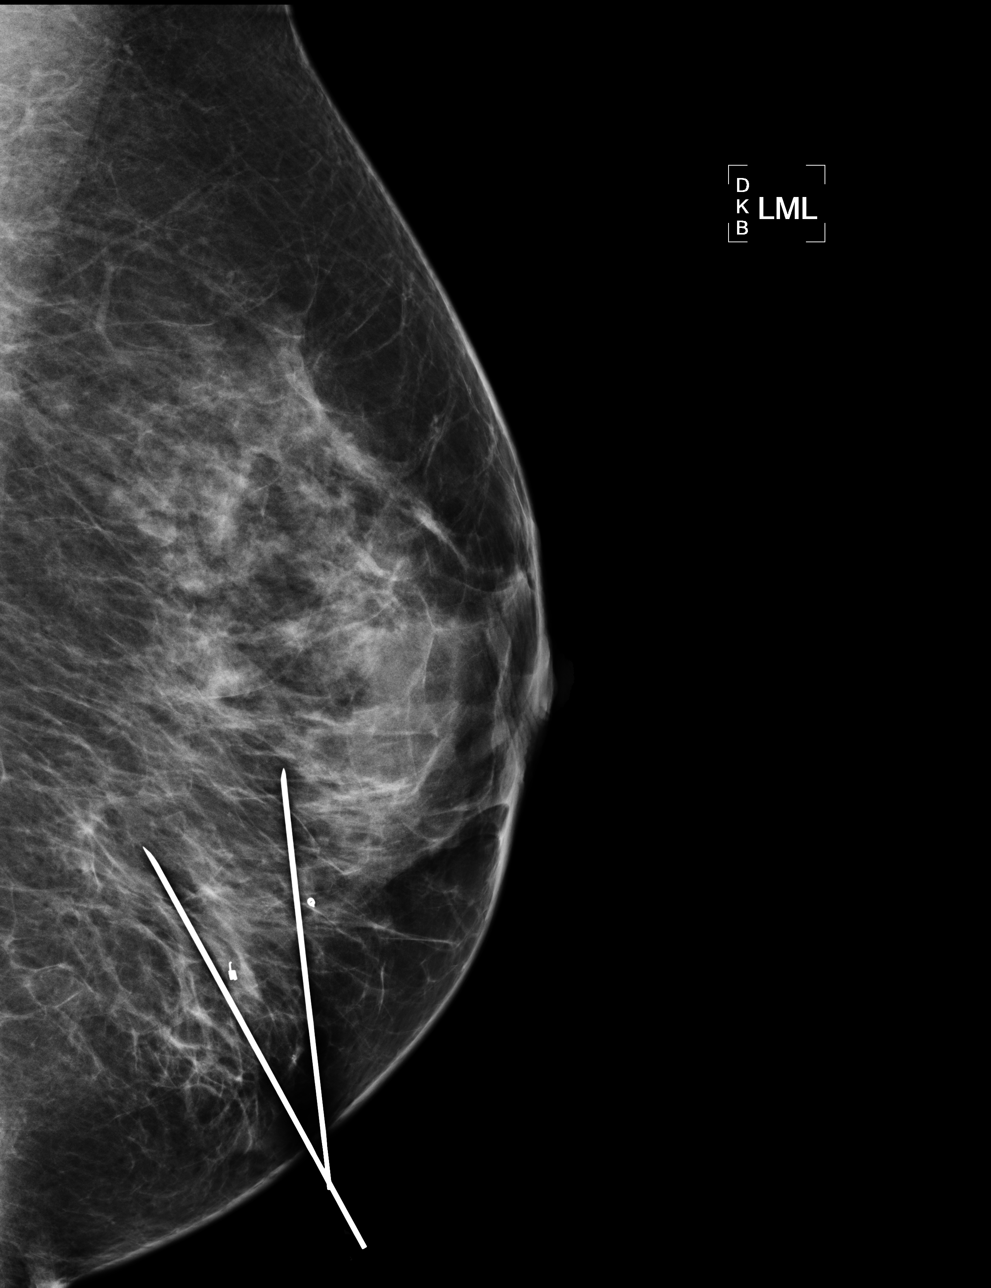

[L FB (3 of 3)]
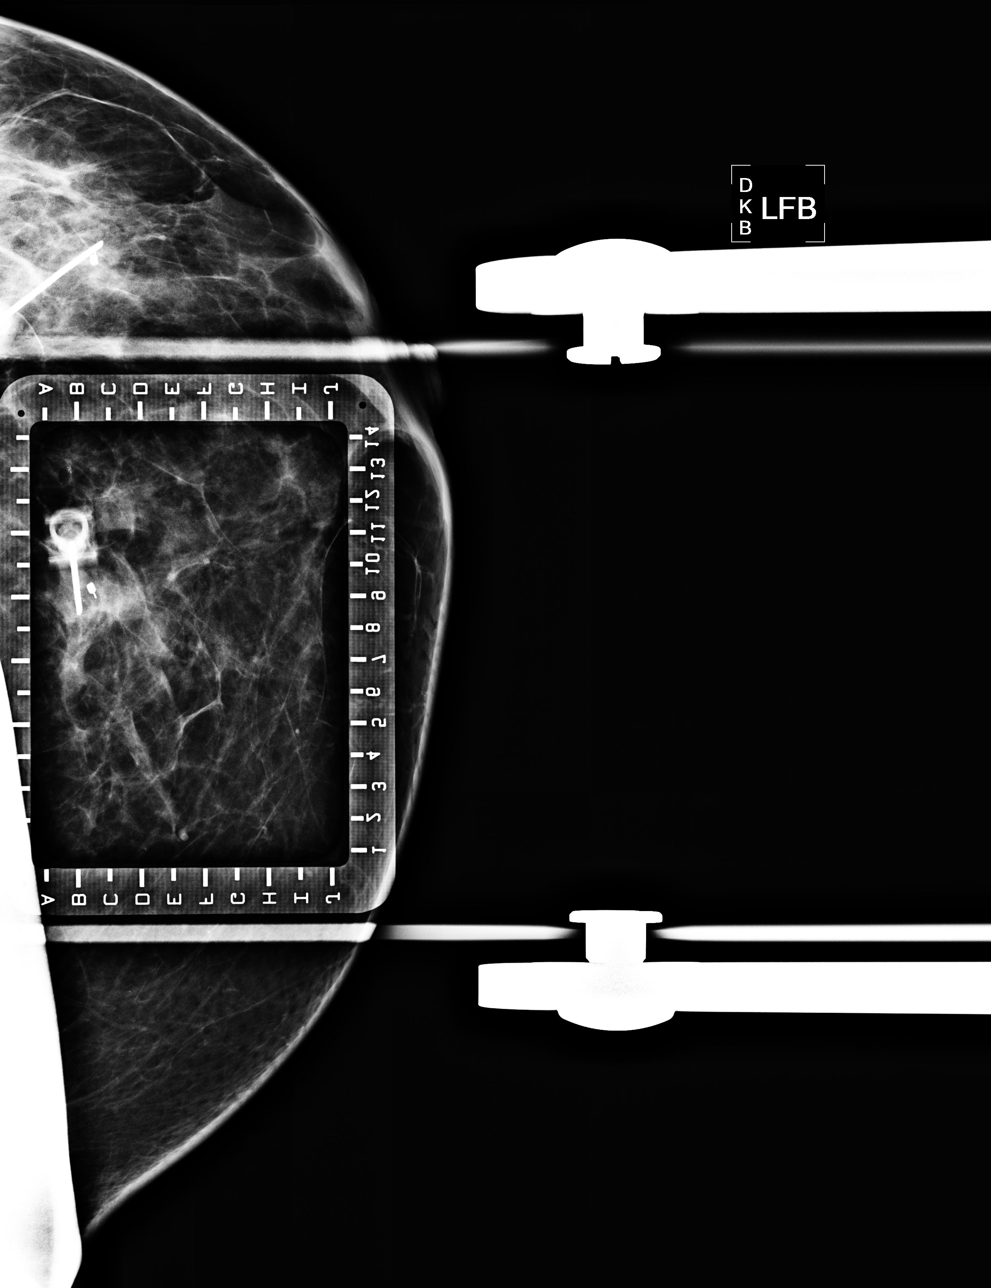

[4 of 4 positions shown; findings below may reference images not displayed]

FINDINGS: Patient presents for needle localization prior to lumpectomy. I met
with the patient and we discussed the procedure of needle
localization including benefits and alternatives. We discussed the
high likelihood of a successful procedure. We discussed the risks of
the procedure, including infection, bleeding, tissue injury, and
further surgery. Informed, written consent was given. The usual
time-out protocol was performed immediately prior to the procedure.

Using mammographic guidance, sterile technique, 1% lidocaine and a 7
cm modified Kopans needle, a coil shaped biopsy clip in the lower
outer quadrant of the left breast was localized using an inferior to
superior approach. Using mammographic guidance, sterile technique,
1% lidocaine and a 7 cm modified Opiigez needle, a coil shaped
biopsy clip in the lower inner quadrant of the left breast was
localized using an inferior to superior approach. The images were
marked for Dr. Fengler.
IMPRESSION: Needle localization left breast at two sites. No apparent
complications.

## 2018-02-09 ENCOUNTER — Ambulatory Visit (INDEPENDENT_AMBULATORY_CARE_PROVIDER_SITE_OTHER): Payer: BLUE CROSS/BLUE SHIELD | Admitting: Obstetrics and Gynecology

## 2018-02-09 ENCOUNTER — Encounter: Payer: Self-pay | Admitting: Obstetrics and Gynecology

## 2018-02-09 VITALS — BP 160/107 | HR 87 | Ht 65.0 in | Wt 190.0 lb

## 2018-02-09 DIAGNOSIS — N852 Hypertrophy of uterus: Secondary | ICD-10-CM

## 2018-02-09 NOTE — Progress Notes (Signed)
Kristen Snyder is her to discuss her U/S results. Pt with known uterine fibroids. U/S confirmed  2 large fibroids ( 8x6x9 & 6x6x7) plus multiple other small fibroids.  Pt does not have any pain and is PM.  PE AF  VSS (BP elevated pt did not take BP medication) Lungs Clear Heart RRR  A/P Uterine fibroids  U/S results reviewed with pt. Treatment options reviewed with pt. As pt is Asx no intervention is recommended at this time. Will schedule F/U U/S in 6 months.  Over 50% of appt face to face discussion

## 2018-04-16 ENCOUNTER — Other Ambulatory Visit: Payer: Self-pay | Admitting: Cardiology

## 2018-04-16 DIAGNOSIS — I5023 Acute on chronic systolic (congestive) heart failure: Secondary | ICD-10-CM

## 2018-04-18 NOTE — Telephone Encounter (Signed)
REFILL 

## 2018-05-27 ENCOUNTER — Other Ambulatory Visit: Payer: Self-pay

## 2018-05-27 MED ORDER — CARVEDILOL 12.5 MG PO TABS: 12.5000 mg | ORAL_TABLET | Freq: Two times a day (BID) | ORAL | 0 refills | Status: DC

## 2018-07-06 ENCOUNTER — Other Ambulatory Visit: Payer: Self-pay | Admitting: Cardiology

## 2018-07-21 ENCOUNTER — Other Ambulatory Visit: Payer: Self-pay | Admitting: Cardiology

## 2018-07-21 DIAGNOSIS — I5023 Acute on chronic systolic (congestive) heart failure: Secondary | ICD-10-CM

## 2018-07-25 ENCOUNTER — Telehealth: Payer: Self-pay | Admitting: *Deleted

## 2018-07-25 ENCOUNTER — Other Ambulatory Visit: Payer: BLUE CROSS/BLUE SHIELD

## 2018-07-25 ENCOUNTER — Ambulatory Visit: Payer: BLUE CROSS/BLUE SHIELD | Admitting: Obstetrics and Gynecology

## 2018-07-25 NOTE — Telephone Encounter (Addendum)
Pt left message stating that she has an appt today for thyroid testing and wants to know if she can come in any earlier than her scheduled appt @ 4:15 pm.   1201  Pt left additional message wanting to know if the doctor or nurse would be calling her with the [place] results.

## 2018-07-26 ENCOUNTER — Telehealth: Payer: Self-pay

## 2018-07-26 NOTE — Telephone Encounter (Signed)
Pt called wanting [place] to send the address and instructions on how to prepare for her scheduled ultrasound on Monday.  Called Pt back with no Answer and voicemail full to advise pt that she is going to [place],  and to come 15 min's early  With a full bladder.

## 2018-07-27 NOTE — Telephone Encounter (Signed)
Called pt to advised of appointment address and instructions. Phone number listed rang twice and went directly to a dead tone. Called back a second time to verify that the number was non working.

## 2018-08-01 ENCOUNTER — Ambulatory Visit
Admission: RE | Admit: 2018-08-01 | Discharge: 2018-08-01 | Disposition: A | Discharge status: Home / Self Care | Payer: BLUE CROSS/BLUE SHIELD | Source: Ambulatory Visit | Attending: Obstetrics and Gynecology | Admitting: Obstetrics and Gynecology

## 2018-08-01 DIAGNOSIS — N852 Hypertrophy of uterus: Secondary | ICD-10-CM

## 2018-08-01 NOTE — Telephone Encounter (Signed)
Attempted to contact pt and was unable to LM due to phone kept beeping.  Will follow up with pt at her appt on 08/29/18.

## 2018-08-02 ENCOUNTER — Telehealth: Payer: Self-pay | Admitting: *Deleted

## 2018-08-02 NOTE — Telephone Encounter (Signed)
-----   Message from Chancy Milroy, MD sent at 08/02/2018 10:37 AM EDT ----- Please let Ms Tarman know that her U/S is unchanged Make a follow up appt with me in 1 yr unless she develops any new problems or concerns Thanks Legrand Como

## 2018-08-02 NOTE — Telephone Encounter (Signed)
Attempted to contact pt to inform her of her U/S results per Dr. Marjory Lies request but her phone rang once and then gave a busy tone.  Will attempt again at another time.

## 2018-08-08 ENCOUNTER — Telehealth: Payer: Self-pay | Admitting: General Practice

## 2018-08-08 NOTE — Telephone Encounter (Signed)
Already addressed and documented in a previous encounter

## 2018-08-08 NOTE — Telephone Encounter (Signed)
Pt aware of u/s results and recommendations. She said she's very happy.

## 2018-08-08 NOTE — Telephone Encounter (Signed)
Opened in error

## 2018-08-08 NOTE — Telephone Encounter (Signed)
Patient left message on nurse voicemail line requesting a call back for results. Patient states a message can be left on her voicemail.

## 2018-08-29 ENCOUNTER — Ambulatory Visit: Payer: BLUE CROSS/BLUE SHIELD | Admitting: Obstetrics and Gynecology

## 2018-10-07 ENCOUNTER — Telehealth: Payer: Self-pay

## 2018-10-07 NOTE — Telephone Encounter (Signed)
Left voice message for patient per Dr. Burr Medico faxed surgery clearance needs to stop Tamoxifen 30 days prior to surgery and we will need to see her one month after surgery to see if we can restart the Tamoxifen.

## 2018-10-07 NOTE — Telephone Encounter (Signed)
Faxed surgery clearance form to [employer] at [facility], stop Tamoxifen 30 days prior to surgery, sent to HIM for scanning.

## 2018-11-01 ENCOUNTER — Ambulatory Visit (INDEPENDENT_AMBULATORY_CARE_PROVIDER_SITE_OTHER): Payer: BLUE CROSS/BLUE SHIELD | Admitting: Cardiology

## 2018-11-01 ENCOUNTER — Encounter: Payer: Self-pay | Admitting: Cardiology

## 2018-11-01 VITALS — BP 158/96 | HR 93 | Ht 65.0 in | Wt 192.0 lb

## 2018-11-01 DIAGNOSIS — Z0389 Encounter for observation for other suspected diseases and conditions ruled out: Secondary | ICD-10-CM

## 2018-11-01 DIAGNOSIS — E1165 Type 2 diabetes mellitus with hyperglycemia: Secondary | ICD-10-CM | POA: Diagnosis not present

## 2018-11-01 DIAGNOSIS — I428 Other cardiomyopathies: Secondary | ICD-10-CM | POA: Diagnosis not present

## 2018-11-01 DIAGNOSIS — IMO0001 Reserved for inherently not codable concepts without codable children: Secondary | ICD-10-CM

## 2018-11-01 DIAGNOSIS — Z01810 Encounter for preprocedural cardiovascular examination: Secondary | ICD-10-CM | POA: Diagnosis not present

## 2018-11-01 DIAGNOSIS — I5022 Chronic systolic (congestive) heart failure: Secondary | ICD-10-CM | POA: Diagnosis not present

## 2018-11-01 DIAGNOSIS — IMO0002 Reserved for concepts with insufficient information to code with codable children: Secondary | ICD-10-CM

## 2018-11-01 DIAGNOSIS — Z794 Long term (current) use of insulin: Secondary | ICD-10-CM

## 2018-11-01 DIAGNOSIS — I10 Essential (primary) hypertension: Secondary | ICD-10-CM

## 2018-11-01 DIAGNOSIS — Z853 Personal history of malignant neoplasm of breast: Secondary | ICD-10-CM

## 2018-11-01 MED ORDER — CARVEDILOL 12.5 MG PO TABS: 12.5000 mg | ORAL_TABLET | Freq: Two times a day (BID) | ORAL | 3 refills | Status: DC

## 2018-11-01 NOTE — Assessment & Plan Note (Signed)
This is being followed by her PCP- she was told she couldn't have surgery until her BS was under better control

## 2018-11-01 NOTE — Progress Notes (Addendum)
11/01/2018 Kristen Snyder   11-Feb-1966  [id number]  Primary Physician Lin Landsman, MD Primary Cardiologist: Dr Stanford Breed  HPI: Pleasant 52 year old female who was seen in 2015 for congestive heart failure.  At that time she had untreated hypertension.  Echocardiogram in November 2015 showed an EF of 15%.  Medications were added and she was followed by the heart failure clinic.  Diagnostic catheterization in November 2015 showed normal coronaries.  After she diuresed and her medications titrated her EF improved to 50-55% by echo June 2016.  Her last office visit here was in November 2017.  In the office today get her medications refilled.  He also needs preoperative clearance for right knee replacement.  Her primary care provider has been following her labs.  Patient.  Left breast pain.  Lumpectomy April 2017 radiation.  He also has insulin-dependent diabetes apparently is not well controlled.  Patient denies any unusual shortness of breath.  Lower extremity edema.  He says he has been compliant with the medication.   Current Outpatient Medications  Medication Sig Dispense Refill  . carvedilol (COREG) 12.5 MG tablet Take 1 tablet (12.5 mg total) by mouth 2 (two) times daily with a meal. Please call to make appointment for further refills. 30 tablet 0  . furosemide (LASIX) 80 MG tablet Take 80 mg by mouth as needed. For weight gain    . LANTUS SOLOSTAR 100 UNIT/ML Solostar Pen Inject 60 Units into the skin at bedtime.   0  . LEVEMIR FLEXTOUCH 100 UNIT/ML Pen Inject 25 Units into the skin at bedtime.  0  . lisinopril (PRINIVIL,ZESTRIL) 40 MG tablet Take 40 mg by mouth daily.  0  . Multiple Vitamin (MULTIVITAMIN) tablet Take 1 tablet by mouth daily.    . rosuvastatin (CRESTOR) 20 MG tablet Take 20 mg by mouth daily.    Marland Kitchen spironolactone (ALDACTONE) 25 MG tablet Take 1 tablet (25 mg total) by mouth daily. NEED OV. 90 tablet 0  . tamoxifen (NOLVADEX) 20 MG tablet Take 1 tablet (20 mg total) by  mouth daily. 30 tablet 11   No current facility-administered medications for this visit.     Allergies  Allergen Reactions  . Bidil [Isosorb Dinitrate-Hydralazine] Other (See Comments)    DISORIENTATION; DIZZINESS    Past Medical History:  Diagnosis Date  . Anxiety   . 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 Breast cancer ([place]) 2017   left  . Dental crown present   . 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 History of congestive heart failure 10/2014  . Hypertension    states under control with meds., has been on med. at least 34 yr.  . Insulin dependent diabetes mellitus ([place])   . Nonischemic cardiomyopathy ([place])   . Personal history of radiation therapy 2016   left    Social History   Socioeconomic History  . Marital status: Married    Spouse name: Not on file  . Number of children: 1  . Years of education: Not on file  . Highest education level: Not on file  Occupational History    Comment: [employer]  Social Needs  . Financial resource strain: Not on file  . Food insecurity:    Worry: Not on file    Inability: Not on file  . Transportation needs:    Medical: Not on file    Non-medical: Not on file  Tobacco Use  . Smoking status: Never Smoker  .  Smokeless tobacco: Never Used  Substance and Sexual Activity  . Alcohol use: No    Alcohol/week: 1.0 standard drinks    Types: 1 Glasses of wine per week    Frequency: Never    Comment: occasionally  . Drug use: No  . Sexual activity: Yes    Birth control/protection: None  Lifestyle  . Physical activity:    Days per week: Not on file    Minutes per session: Not on file  . Stress: Not on file  Relationships  . Social connections:    Talks on phone: Not on file    Gets together: Not on file    Attends religious service: Not on file    Active member of club or organization: Not on file    Attends meetings of clubs or organizations: Not on file    Relationship status: Not  on file  . Intimate partner violence:    Fear of current or ex partner: Not on file    Emotionally abused: Not on file    Physically abused: Not on file    Forced sexual activity: Not on file  Other Topics Concern  . Not on file  Social History Narrative  . Not on file     Family History  Problem Relation Age of Onset  . Diabetes Mother   . Hypertension Mother   . Cancer Mother 38       colon  . Hypertension Father   . Diabetes Father   . Cancer Father 51       prostate cancer   . Heart disease Unknown        No family history  . Cancer Paternal Aunt        breast cancer   . Cancer Paternal Aunt 55       ovarian cancer      Review of Systems: General: negative for chills, fever, night sweats or weight changes.  Cardiovascular: negative for chest pain, dyspnea on exertion, edema, orthopnea, palpitations, paroxysmal nocturnal dyspnea or shortness of breath Dermatological: negative for rash Respiratory: negative for cough or wheezing Urologic: negative for hematuria Abdominal: negative for nausea, vomiting, diarrhea, bright red blood per rectum, melena, or hematemesis Neurologic: negative for visual changes, syncope, or dizziness All other systems reviewed and are otherwise negative except as noted above.    Blood pressure (!) 158/96, pulse 93, height 5\' 5"  (1.651 m), weight 192 lb (87.1 kg), last menstrual period 03/16/2016.  General appearance: alert, cooperative, no distress and mildly obese Neck: no carotid bruit and no JVD Lungs: clear to auscultation bilaterally Heart: regular rate and rhythm Extremities: no edema Skin: Skin color, texture, turgor normal. No rashes or lesions Neurologic: Grossly normal  EKG NSR-HR 90  LAB- 9.9/19 from Dr Ayesha Rumpf LDL 57 TSH 0.48 BUN/ SCr- 11/0.78 LFTs WNL   ASSESSMENT AND PLAN:   Pre-operative cardiovascular examination Patient seen today for preoperative clearance to get medications refilled.  Nonischemic  cardiomyopathy ([place]) NICM in Nov 2015 which resolved by June 2016  Insulin dependent type 2 diabetes mellitus, uncontrolled ([place]) This is being followed by her PCP- she was told she couldn't have surgery until her BS was under better control  Essential hypertension Not under control in the office today- she will follow up with her PCP. Goal B/P 120/80  History of breast cancer Lt breast lumpectomy followed by radiation- 2017  Normal coronary arteries Nov 2015   PLAN  I told Kristen Snyder she could stop her ASA. She  asked if she needed to continue her other medications and I explained that it was important in order to keep her B/P under control and prevent a recurrence of her NICM.   From a cardiac standpoint she is an acceptable risk for knee replacement without further testing and we'll send this office note and records to the orthopedist.    Kerin Ransom PA-C 11/01/2018 8:35 AM

## 2018-11-01 NOTE — Assessment & Plan Note (Signed)
Lt breast lumpectomy followed by radiation- 2017

## 2018-11-01 NOTE — Assessment & Plan Note (Signed)
NICM in Nov 2015 which resolved by June 2016

## 2018-11-01 NOTE — Assessment & Plan Note (Signed)
Patient seen today for preoperative clearance to get medications refilled.

## 2018-11-01 NOTE — Patient Instructions (Signed)
Medication Instructions:  STOP Aspirin If you need a refill on your cardiac medications before your next appointment, please call your pharmacy.   Lab work: None  If you have labs (blood work) drawn today and your tests are completely normal, you will receive your results only by: Marland Kitchen MyChart Message (if you have MyChart) OR . A paper copy in the mail If you have any lab test that is abnormal or we need to change your treatment, we will call you to review the results.  Testing/Procedures: None   Follow-Up: At [facility], you and your health needs are our priority.  As part of our continuing mission to provide you with exceptional heart care, we have created designated Provider Care Teams.  These Care Teams include your primary Cardiologist (physician) and Advanced Practice Providers (APPs -  Physician Assistants and Nurse Practitioners) who all work together to provide you with the care you need, when you need it. You will need a follow up appointment in 12 months.  Please call our office 2 months (October 2020) in advance to schedule this appointment.  You may see Dr Kirk Ruths or one of the following Advanced Practice Providers on your designated Care Team:   Kerin Ransom, PA-C Roby Lofts, [place] . Sande Rives, PA-C  Any Other Special Instructions Will Be Listed Below (If Applicable). WE WILL FAX DR MURPHY'S OFFICE A COPY OF TODAYS OFFICE NOTE AND EKG; WE WILL REQUEST LABS FROM YOUR PCP

## 2018-11-01 NOTE — Assessment & Plan Note (Signed)
Not under control in the office today- she will follow up with her PCP. Goal B/P 120/80

## 2018-11-01 NOTE — Assessment & Plan Note (Signed)
Nov 2015

## 2018-11-02 ENCOUNTER — Other Ambulatory Visit: Payer: Self-pay

## 2018-11-02 MED ORDER — CARVEDILOL 12.5 MG PO TABS: 12.5000 mg | ORAL_TABLET | Freq: Two times a day (BID) | ORAL | 3 refills | Status: DC

## 2018-11-29 ENCOUNTER — Other Ambulatory Visit: Payer: Self-pay | Admitting: Hematology

## 2018-11-29 DIAGNOSIS — Z853 Personal history of malignant neoplasm of breast: Secondary | ICD-10-CM

## 2018-12-13 NOTE — Progress Notes (Signed)
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  SUMMARY OF ONCOLOGIC HISTORY: Oncology History   Breast cancer of lower-outer quadrant of left female breast [facility])   Staging form: Breast, AJCC 7th Edition     Clinical: Stage 0 (Tis (DCIS), N0, M0) - Unsigned     Pathologic stage from 02/17/2016: Stage 0 (Tis (DCIS), N0, cM0) - Signed by Truitt Merle, MD on 03/08/2016       Breast cancer of lower-outer quadrant of left female breast ([place])   09/12/2015 Mammogram    Screening mammogram showed calcification in left breast     10/09/2015 Mammogram    diagnostoc mammo and US showed 3.0cm  loosely grouped calcification in the lower inner left breast are suspicious     10/23/2015 Initial Diagnosis    Breast cancer of lower-outer quadrant of left female breast ([place])    10/23/2015 Initial Biopsy    left breast biopsy showed DCIS and comedonecrosis     10/23/2015 Receptors her2    ER 100%+, PR 5%+    11/13/2015 Imaging    Breast MRI showed known masslike enhancement in the left breast in 2 locations, measuring 4.3 x 1.6 x 1.4 cm, and 2.6 x 1.0 x 1.0 cm. Normal appearance of the right breast. No evidence of adenopathy.    01/24/2016 Imaging    MR breast b/l w wo contrast 01/24/2016 IMPRESSION: 1. Significantly less non mass enhancement in the 2 areas of the left breast previously identified. There is residual stippled non mass enhancement between the tissue marker clips in the lower inner quadrant and slight lower outer quadrant spanning approximately 3.8 cm (between the 2 coil shaped tissue marker clips, distance between the clips on the MRI approximating 2.6 cm, approximately 5 cm apart on the CC mammogram with compression). This corresponds to the biopsy-proven foci of DCIS and ADH. 2. Residual non mass enhancement  in the lower inner quadrant, posterior depth (biopsy-proven ALH) spanning approximately 2.4 cm. 3. No new abnormalities involving either breast. 4. No pathologic lymphadenopathy.    02/17/2016 Surgery    left breast lumpectomy    02/17/2016 Pathology Results    left breast lumpectomy showed low grade DCIS, less than 0.1cm, (+) ALH, DCIS focally 0.1cm to the inferior margin    03/23/2016 - 05/07/2016 Radiation Therapy    Left breast radiation    05/30/2016 -  Anti-estrogen oral therapy    Tamoxifen 57m daily     01/04/2017 Mammogram    IMPRESSION: No evidence of malignancy in either breast.    01/05/2018 Mammogram    01/05/2018 Mammogram IMPRESSION: No evidence of malignancy within either breast. Stable postsurgical changes within the left breast.     CURRENT THERAPY Tamoxifen 226mdaily started on 05/30/2016    INTERVAL HISTORY: Kristen Snyder a [age].o. female who is here for follow-up. Since our last visit, she had a benign mammogram.  Today, she is here alone. She is doing well and she denies pain. She denies new complaints or medication changes. No new joint pain. She previously had bilateral knee pain that is stable. She plans to have a knee replacement when she controls her blood glucose. She denies hot flashes. She no longer has menses. She occasionally noticed pale pink streaks of blood when she wipes, and she relates that to  wiping so hard. She denies having hemorrhoids. She tries to stay active, but her physical activity is limited due to knee pain.   Pertinent positives and negatives of review of systems are listed and detailed within the above HPI.  REVIEW OF SYSTEMS:   Constitutional: Denies fevers, chills or abnormal weight loss Eyes: Denies blurriness of vision Ears, nose, mouth, throat, and face: Denies mucositis or sore throat Respiratory: Denies cough, dyspnea or wheezes Cardiovascular: Denies palpitation, chest discomfort or lower extremity  swelling Gastrointestinal:  Denies nausea, heartburn or change in bowel habits GU: (+) pale pink streaks of blood occasionally when she wipes  Skin: Denies abnormal skin rashes Lymphatics: Denies new lymphadenopathy or easy bruising Neurological:Denies numbness, tingling or new weaknesses MSK: (+) bilateral previous knee pain Behavioral/Psych: Mood is stable, no new changes  All other systems were reviewed with the patient and are negative.  MEDICAL HISTORY:  Past Medical History:  Diagnosis Date  . Anxiety   . 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 Breast cancer ([place]) 2017   left  . Dental crown present   . 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 History of congestive heart failure 10/2014  . Hypertension    states under control with meds., has been on med. at least 54 yr.  . Insulin dependent diabetes mellitus ([place])   . Nonischemic cardiomyopathy ([place])   . Personal history of radiation therapy 2016   left    SURGICAL HISTORY: Past Surgical History:  Procedure Laterality Date  . BREAST LUMPECTOMY Left 2017  . BREAST LUMPECTOMY WITH NEEDLE LOCALIZATION Left 02/17/2016   Procedure: LEFT BREAST LUMPECTOMY WITH BRACKETED NEEDLE LOCALIZATION;  Surgeon: Excell Seltzer, MD;  Location: [facility];  Service: General;  Laterality: Left;  . EXCISION / BIOPSY BREAST / NIPPLE / DUCT Left 02/17/2016   lumpectomy 2017  . KNEE ARTHROSCOPY WITH ANTERIOR CRUCIATE LIGAMENT (ACL) REPAIR Right   . LEFT AND RIGHT HEART CATHETERIZATION WITH CORONARY ANGIOGRAM [name] 10/08/2014   Procedure: LEFT AND RIGHT HEART CATHETERIZATION WITH CORONARY ANGIOGRAM;  Surgeon: Jettie Booze, MD;  Location[facility] C[facility] LAB;  Service: Cardiovascular;  Laterality: [name];  . SHOULDER ARTHROSCOPY W/ ROTATOR CUFF REPAIR Right     I have reviewed the social history and family history with the patient and they are unchanged from previous note.  ALLERGIES:  is allergic to  bidil [isosorb dinitrate-hydralazine].  MEDICATIONS:  Current Outpatient Medications  Medication Sig Dispense Refill  . carvedilol (COREG) 12.5 MG tablet Take 1 tablet (12.5 mg total) by mouth 2 (two) times daily with a meal. 180 tablet 3  . furosemide (LASIX) 80 MG tablet Take 80 mg by mouth as needed. For weight gain    . LANTUS SOLOSTAR 100 UNIT/ML Solostar Pen Inject 60 Units into the skin at bedtime.   0  . LEVEMIR FLEXTOUCH 100 UNIT/ML Pen Inject 25 Units into the skin at bedtime.  0  . lisinopril (PRINIVIL,ZESTRIL) 40 MG tablet Take 40 mg by mouth daily.  0  . Multiple Vitamin (MULTIVITAMIN) tablet Take 1 tablet by mouth daily.    . rosuvastatin (CRESTOR) 20 MG tablet Take 20 mg by mouth daily.    Marland Kitchen spironolactone (ALDACTONE) 25 MG tablet Take 1 tablet (25 mg total) by mouth daily. NEED OV. 90 tablet 0  . tamoxifen (NOLVADEX) 20 MG tablet Take 1 tablet (20 mg total) by mouth daily. 30 tablet 11   No current facility-administered medications for this visit.  PHYSICAL EXAMINATION: ECOG PERFORMANCE STATUS: 0 - Asymptomatic  Vitals:   12/15/18 0817  BP: (!) 140/93  Pulse: 91  Resp: 18  Temp: 99.1 F (37.3 C)  SpO2: 98%   Filed Weights   12/15/18 0817  Weight: 191 lb 12.8 oz (87 kg)    GENERAL:alert, no distress and comfortable SKIN: skin color, texture, turgor are normal, no rashes or significant lesions EYES: normal, Conjunctiva are pink and non-injected, sclera clear OROPHARYNX:no exudate, no erythema and lips, buccal mucosa, and tongue normal  NECK: supple, thyroid normal size, non-tender, without nodularity LYMPH:  no palpable lymphadenopathy in the cervical, axillary or inguinal LUNGS: clear to auscultation and percussion with normal breathing effort HEART: regular rate & rhythm and no murmurs and no lower extremity edema ABDOMEN:abdomen soft, non-tender and normal bowel sounds Musculoskeletal:no cyanosis of digits and no clubbing  NEURO: alert & oriented x 3  with fluent speech, no focal motor/sensory deficits Breast: (+) mild breast skin rash around bra line (+) surgical scar healing well in the left breast  LABORATORY DATA:  I have reviewed the data as listed CBC Latest Ref Rng & Units 12/15/2018 12/15/2017 12/15/2016  WBC 4.0 - 10.5 K/uL 4.1 3.3(L) 3.2(L)  Hemoglobin 12.0 - 15.0 g/dL 14.2 13.4 14.2  Hematocrit 36.0 - 46.0 % 44.0 40.4 42.2  Platelets 150 - 400 K/uL 243 239 219     CMP Latest Ref Rng & Units 12/15/2018 12/15/2017 12/15/2016  Glucose 70 - 99 mg/dL 248(H) 330(H) 376(H)  BUN 6 - 20 mg/dL 12 10 9.9  Creatinine 0.44 - 1.00 mg/dL 0.89 0.80 0.9  Sodium 135 - 145 mmol/L 139 136 136  Potassium 3.5 - 5.1 mmol/L 4.4 4.2 4.2  Chloride 98 - 111 mmol/L 104 103 -  CO2 22 - 32 mmol/L '25 22 22  ' Calcium 8.9 - 10.3 mg/dL 9.1 8.9 9.4  Total Protein 6.5 - 8.1 g/dL 7.0 6.9 7.4  Total Bilirubin 0.3 - 1.2 mg/dL 0.4 0.4 0.53  Alkaline Phos 38 - 126 U/L 70 92 81  AST 15 - 41 U/L 12(L) 11 11  ALT 0 - 44 U/L '15 9 14      ' RADIOGRAPHIC STUDIES: I have personally reviewed the radiological images as listed and agreed with the findings in the report. No results found.   01/05/2018 Mammogram IMPRESSION: No evidence of malignancy within either breast. Stable postsurgical changes within the left breast.  ASSESSMENT & PLAN:  TACHA MANNI is a 53 y.o. female with history of  1. Breast cancer of lower-outer outer quadrant of left female breast, DCIS, intermediate to high grade, ER+ /PR+ -Diagnosed in 08/2015. Treated with left breast lumpectomy, radiation, and Tamoxifen. Currently on Tamoxifen, started in 2017.  -I reviewed and discussed her 2019 mammogram which was benign. -Labs reviewed, CBC showed RBC 5.47, otherwise normal. She still has significant hyperglycemia  -She is clinically stable and doing well. She denies side effects of Tamoxifen and is tolerating it very well. -I discussed DEXA scan. She agrees, will order for next month. -Will  also have a mammogram next month -she will follow up with her OB/GYN between our visits -f/u in 12 months with labs   2. HTN, DM, CHF, poorly controlled hyperglycemia -f/u with PCP and continue medications  -I again explained that tamoxifen can worsen BG, and advised her to monitor her BG and maintain a healthy diet.  3. Bilateral Knee pain -Started before Tamoxifen. Overall stable and not worsening while on Tamoxifen. -She plans to  have knee replacement after she controls her hyperglycemia -will monitor  Plan  -I ordered a DEXA scan to be done next month with mammogram -continue Tamoxifen, she will hold tamoxifen 1 months before knee surgery, and restart a month after she recovers well from surgery. -f/u in 12 months with labs  No problem-specific Assessment & Plan notes found for this encounter.   Orders Placed This Encounter  Procedures  . DG Bone Density    Standing Status:   Future    Standing Expiration Date:   12/15/2019    Scheduling Instructions:     Same day as her mammogram    Order Specific Question:   Reason for Exam (SYMPTOM  OR DIAGNOSIS REQUIRED)    Answer:   screening    Order Specific Question:   Is the patient pregnant?    Answer:   No    Order Specific Question:   Preferred imaging location?    Answer:   [facility]   All questions were answered. The patient knows to call the clinic with any problems, questions or concerns. No barriers to learning was detected. I spent 15 minutes counseling the patient face to face. The total time spent in the appointment was 20 minutes and more than 50% was on counseling and review of test results  I, Noor Dweik am acting as scribe for Dr. Truitt Merle.  I have reviewed the above documentation for accuracy and completeness, and I agree with the above.     Truitt Merle, MD 12/15/2018

## 2018-12-14 ENCOUNTER — Telehealth: Payer: Self-pay | Admitting: Hematology

## 2018-12-14 NOTE — Telephone Encounter (Signed)
Called patient per 1/28 sch message - unable to reach pt - left message for patient to call back

## 2018-12-15 ENCOUNTER — Inpatient Hospital Stay: Payer: BLUE CROSS/BLUE SHIELD | Attending: Hematology | Admitting: Hematology

## 2018-12-15 ENCOUNTER — Inpatient Hospital Stay: Payer: BLUE CROSS/BLUE SHIELD

## 2018-12-15 ENCOUNTER — Telehealth: Payer: Self-pay | Admitting: Hematology

## 2018-12-15 ENCOUNTER — Encounter: Payer: Self-pay | Admitting: Hematology

## 2018-12-15 VITALS — BP 140/93 | HR 91 | Temp 99.1°F | Resp 18 | Ht 65.0 in | Wt 191.8 lb

## 2018-12-15 DIAGNOSIS — Z170 Estrogen receptor positive status [ER+]: Secondary | ICD-10-CM | POA: Diagnosis not present

## 2018-12-15 DIAGNOSIS — Z79899 Other long term (current) drug therapy: Secondary | ICD-10-CM | POA: Diagnosis not present

## 2018-12-15 DIAGNOSIS — C50512 Malignant neoplasm of lower-outer quadrant of left female breast: Secondary | ICD-10-CM | POA: Diagnosis not present

## 2018-12-15 DIAGNOSIS — E1165 Type 2 diabetes mellitus with hyperglycemia: Secondary | ICD-10-CM

## 2018-12-15 DIAGNOSIS — I509 Heart failure, unspecified: Secondary | ICD-10-CM | POA: Diagnosis not present

## 2018-12-15 DIAGNOSIS — Z79810 Long term (current) use of selective estrogen receptor modulators (SERMs): Secondary | ICD-10-CM | POA: Insufficient documentation

## 2018-12-15 DIAGNOSIS — Z794 Long term (current) use of insulin: Secondary | ICD-10-CM | POA: Insufficient documentation

## 2018-12-15 DIAGNOSIS — Z923 Personal history of irradiation: Secondary | ICD-10-CM

## 2018-12-15 DIAGNOSIS — I10 Essential (primary) hypertension: Secondary | ICD-10-CM | POA: Diagnosis not present

## 2018-12-15 DIAGNOSIS — E2839 Other primary ovarian failure: Secondary | ICD-10-CM

## 2018-12-15 LAB — CBC WITH DIFFERENTIAL/PLATELET
ABS IMMATURE GRANULOCYTES: 0.02 10*3/uL (ref 0.00–0.07)
BASOS PCT: 0 %
Basophils Absolute: 0 10*3/uL (ref 0.0–0.1)
EOS PCT: 3 %
Eosinophils Absolute: 0.1 10*3/uL (ref 0.0–0.5)
HCT: 44 % (ref 36.0–46.0)
HEMOGLOBIN: 14.2 g/dL (ref 12.0–15.0)
Immature Granulocytes: 1 %
LYMPHS PCT: 33 %
Lymphs Abs: 1.4 10*3/uL (ref 0.7–4.0)
MCH: 26 pg (ref 26.0–34.0)
MCHC: 32.3 g/dL (ref 30.0–36.0)
MCV: 80.4 fL (ref 80.0–100.0)
MONO ABS: 0.4 10*3/uL (ref 0.1–1.0)
MONOS PCT: 11 %
Neutro Abs: 2.2 10*3/uL (ref 1.7–7.7)
Neutrophils Relative %: 52 %
Platelets: 243 10*3/uL (ref 150–400)
RBC: 5.47 MIL/uL — ABNORMAL HIGH (ref 3.87–5.11)
RDW: 13.8 % (ref 11.5–15.5)
WBC: 4.1 10*3/uL (ref 4.0–10.5)
nRBC: 0 % (ref 0.0–0.2)

## 2018-12-15 LAB — COMPREHENSIVE METABOLIC PANEL
ALBUMIN: 3.9 g/dL (ref 3.5–5.0)
ALT: 15 U/L (ref 0–44)
AST: 12 U/L — ABNORMAL LOW (ref 15–41)
Alkaline Phosphatase: 70 U/L (ref 38–126)
Anion gap: 10 (ref 5–15)
BILIRUBIN TOTAL: 0.4 mg/dL (ref 0.3–1.2)
BUN: 12 mg/dL (ref 6–20)
CO2: 25 mmol/L (ref 22–32)
CREATININE: 0.89 mg/dL (ref 0.44–1.00)
Calcium: 9.1 mg/dL (ref 8.9–10.3)
Chloride: 104 mmol/L (ref 98–111)
GFR calc Af Amer: >60 mL/min (ref >60)
GLUCOSE: 248 mg/dL — AB (ref 70–99)
Potassium: 4.4 mmol/L (ref 3.5–5.1)
Sodium: 139 mmol/L (ref 135–145)
TOTAL PROTEIN: 7 g/dL (ref 6.5–8.1)

## 2018-12-15 NOTE — Telephone Encounter (Signed)
Scheduled appt per 01/30 los.  Printed avs.

## 2019-01-06 ENCOUNTER — Other Ambulatory Visit: Payer: Self-pay | Admitting: Hematology

## 2019-01-06 ENCOUNTER — Other Ambulatory Visit: Payer: Self-pay

## 2019-01-06 DIAGNOSIS — Z853 Personal history of malignant neoplasm of breast: Secondary | ICD-10-CM

## 2019-01-10 ENCOUNTER — Ambulatory Visit
Admission: RE | Admit: 2019-01-10 | Discharge: 2019-01-10 | Disposition: A | Discharge status: Home / Self Care | Payer: BLUE CROSS/BLUE SHIELD | Source: Ambulatory Visit | Attending: Hematology | Admitting: Hematology

## 2019-01-10 DIAGNOSIS — R922 Inconclusive mammogram: Secondary | ICD-10-CM | POA: Diagnosis not present

## 2019-01-10 DIAGNOSIS — Z853 Personal history of malignant neoplasm of breast: Secondary | ICD-10-CM

## 2019-01-13 ENCOUNTER — Other Ambulatory Visit: Payer: Self-pay | Admitting: Cardiology

## 2019-01-13 DIAGNOSIS — I5023 Acute on chronic systolic (congestive) heart failure: Secondary | ICD-10-CM

## 2019-01-16 NOTE — Telephone Encounter (Signed)
Rx(s) sent to pharmacy electronically.  

## 2019-01-31 ENCOUNTER — Other Ambulatory Visit: Payer: Self-pay | Admitting: Hematology

## 2019-02-02 DIAGNOSIS — E119 Type 2 diabetes mellitus without complications: Secondary | ICD-10-CM | POA: Diagnosis not present

## 2019-02-02 DIAGNOSIS — E782 Mixed hyperlipidemia: Secondary | ICD-10-CM | POA: Diagnosis not present

## 2019-02-02 DIAGNOSIS — I10 Essential (primary) hypertension: Secondary | ICD-10-CM | POA: Diagnosis not present

## 2019-08-18 DIAGNOSIS — M5032 Other cervical disc degeneration, mid-cervical region, unspecified level: Secondary | ICD-10-CM | POA: Diagnosis not present

## 2019-08-18 DIAGNOSIS — M531 Cervicobrachial syndrome: Secondary | ICD-10-CM | POA: Diagnosis not present

## 2019-08-18 DIAGNOSIS — M9902 Segmental and somatic dysfunction of thoracic region: Secondary | ICD-10-CM | POA: Diagnosis not present

## 2019-08-18 DIAGNOSIS — M9901 Segmental and somatic dysfunction of cervical region: Secondary | ICD-10-CM | POA: Diagnosis not present

## 2019-09-18 ENCOUNTER — Other Ambulatory Visit: Payer: Self-pay | Admitting: Hematology

## 2019-09-19 DIAGNOSIS — E119 Type 2 diabetes mellitus without complications: Secondary | ICD-10-CM | POA: Diagnosis not present

## 2019-09-19 DIAGNOSIS — E782 Mixed hyperlipidemia: Secondary | ICD-10-CM | POA: Diagnosis not present

## 2019-09-19 DIAGNOSIS — I10 Essential (primary) hypertension: Secondary | ICD-10-CM | POA: Diagnosis not present

## 2019-10-04 ENCOUNTER — Telehealth: Payer: Self-pay

## 2019-10-04 NOTE — Telephone Encounter (Signed)
Left message for patient to contact office if she is interested in doing a virtual visit to prevent the spread of COVID.

## 2019-10-04 NOTE — Telephone Encounter (Signed)
-----   Message from Erlene Quan, [place] sent at 10/04/2019  9:13 AM EST ----- This patient can be virtual  Kerin Ransom PA-C 10/04/2019 9:13 AM

## 2019-10-25 ENCOUNTER — Ambulatory Visit: Payer: BLUE CROSS/BLUE SHIELD | Admitting: Obstetrics and Gynecology

## 2019-11-01 ENCOUNTER — Ambulatory Visit: Payer: BC Managed Care – PPO | Admitting: Cardiology

## 2019-11-07 ENCOUNTER — Ambulatory Visit (INDEPENDENT_AMBULATORY_CARE_PROVIDER_SITE_OTHER): Payer: BC Managed Care – PPO | Admitting: Cardiology

## 2019-11-07 ENCOUNTER — Other Ambulatory Visit: Payer: Self-pay

## 2019-11-07 ENCOUNTER — Encounter: Payer: Self-pay | Admitting: Cardiology

## 2019-11-07 ENCOUNTER — Encounter (INDEPENDENT_AMBULATORY_CARE_PROVIDER_SITE_OTHER): Payer: Self-pay

## 2019-11-07 VITALS — BP 155/100 | HR 94 | Temp 97.2°F | Ht 65.0 in | Wt 186.0 lb

## 2019-11-07 DIAGNOSIS — M1710 Unilateral primary osteoarthritis, unspecified knee: Secondary | ICD-10-CM | POA: Insufficient documentation

## 2019-11-07 DIAGNOSIS — Z853 Personal history of malignant neoplasm of breast: Secondary | ICD-10-CM | POA: Diagnosis not present

## 2019-11-07 DIAGNOSIS — E1165 Type 2 diabetes mellitus with hyperglycemia: Secondary | ICD-10-CM | POA: Diagnosis not present

## 2019-11-07 DIAGNOSIS — M179 Osteoarthritis of knee, unspecified: Secondary | ICD-10-CM | POA: Insufficient documentation

## 2019-11-07 DIAGNOSIS — M1712 Unilateral primary osteoarthritis, left knee: Secondary | ICD-10-CM

## 2019-11-07 DIAGNOSIS — I10 Essential (primary) hypertension: Secondary | ICD-10-CM | POA: Diagnosis not present

## 2019-11-07 DIAGNOSIS — I428 Other cardiomyopathies: Secondary | ICD-10-CM | POA: Diagnosis not present

## 2019-11-07 DIAGNOSIS — Z794 Long term (current) use of insulin: Secondary | ICD-10-CM

## 2019-11-07 DIAGNOSIS — IMO0002 Reserved for concepts with insufficient information to code with codable children: Secondary | ICD-10-CM

## 2019-11-07 DIAGNOSIS — I493 Ventricular premature depolarization: Secondary | ICD-10-CM | POA: Insufficient documentation

## 2019-11-07 NOTE — Progress Notes (Addendum)
Cardiology Office Note:    Date:  11/07/2019   ID:  Kristen Snyder, DOB 1966/08/15, MRN [id number]  PCP:  Lin Landsman, MD  Cardiologist:  Dr Stanford Breed Electrophysiologist:  None   Referring MD: Lin Landsman, MD   No chief complaint on file. Yearly follow up  History of Present Illness:    Kristen Snyder is a 53 y.o. female with a hx of CHF and NICM.  In 2015 she presented with CHF that had been treated as an OP as CAP.  She also had untreated hypertension.  Echocardiogram in November 2015 showed an EF of 15%.  Her medications were added and she was followed by the heart failure clinic.  Diagnostic catheterization in November 2015 showed normal coronaries.  After she diuresed and her medications titrated her EF improved to 50-55% by echo June 2016.  Other medical problems include IDDM, breast cancer in 2017 treated with lumpectomy and radiation, and DJD.  She needs TKR but this has been on hold secondary to elevated BS which the patient tells me is related to Tamoxifen Rx.   She is in the office today for routine follow up.  She has done well over the past year.  She works from home.  No unusual chest pain, orthopnea, SOB, or edema.  She has Lasix PRN but hasn't used this.  Her B/P is elevated in the office this morning but she tells me she hasn't taken her medications yet. In reviewing her past B/Ps she has definitely trended high.  I suggested she follow this at home and have provided her with a B/P cuff.  I'll do a virtual follow up in 2-3 weeks to review her B/P readings.   Past Medical History:  Diagnosis Date  . Anxiety   . 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 Breast cancer ([place]) 2017   left  . Dental crown present   . 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 History of congestive heart failure 10/2014  . Hypertension    states under control with meds., has been on med. at least 41 yr.  . Insulin dependent diabetes mellitus   . Nonischemic  cardiomyopathy ([place])   . Personal history of radiation therapy 2016   left    Past Surgical History:  Procedure Laterality Date  . BREAST LUMPECTOMY Left 2017  . BREAST LUMPECTOMY WITH NEEDLE LOCALIZATION Left 02/17/2016   Procedure: LEFT BREAST LUMPECTOMY WITH BRACKETED NEEDLE LOCALIZATION;  Surgeon: Excell Seltzer, MD;  Location: [facility];  Service: General;  Laterality: Left;  . EXCISION / BIOPSY BREAST / NIPPLE / DUCT Left 02/17/2016   lumpectomy 2017  . KNEE ARTHROSCOPY WITH ANTERIOR CRUCIATE LIGAMENT (ACL) REPAIR Right   . LEFT AND RIGHT HEART CATHETERIZATION WITH CORONARY ANGIOGRAM [name] 10/08/2014   Procedure: LEFT AND RIGHT HEART CATHETERIZATION WITH CORONARY ANGIOGRAM;  Surgeon: Jettie Booze, MD;  Location[facility] CATH LAB;  Service: Cardiovascular;  Laterality: [name];  . SHOULDER ARTHROSCOPY W/ ROTATOR CUFF REPAIR Right     Current Medications: Current Meds  Medication Sig  . carvedilol (COREG) 12.5 MG tablet Take 1 tablet (12.5 mg total) by mouth 2 (two) times daily with a meal.  . furosemide (LASIX) 80 MG tablet Take 80 mg by mouth as needed. For weight gain  . LANTUS SOLOSTAR 100 UNIT/ML Solostar Pen Inject 60 Units into the skin at bedtime.   Marland Kitchen LEVEMIR FLEXTOUCH 100 UNIT/ML Pen Inject 25 Units into the skin  at bedtime.  Marland Kitchen lisinopril (PRINIVIL,ZESTRIL) 40 MG tablet Take 40 mg by mouth daily.  . Multiple Vitamin (MULTIVITAMIN) tablet Take 1 tablet by mouth daily.  . rosuvastatin (CRESTOR) 20 MG tablet Take 20 mg by mouth daily.  Marland Kitchen spironolactone (ALDACTONE) 25 MG tablet Take 1 tablet (25 mg total) by mouth daily.  . tamoxifen (NOLVADEX) 20 MG tablet TAKE 1 TABLET BY MOUTH EVERY DAY     Allergies:   Bidil [isosorb dinitrate-hydralazine]   Social History   Socioeconomic History  . Marital status: Married    Spouse name: Not on file  . Number of children: 1  . Years of education: Not on file  . Highest education level: Not on file  Occupational  History    Comment: [employer]  Tobacco Use  . Smoking status: Never Smoker  . Smokeless tobacco: Never Used  Substance and Sexual Activity  . Alcohol use: No    Alcohol/week: 1.0 standard drinks    Types: 1 Glasses of wine per week    Comment: occasionally  . Drug use: No  . Sexual activity: Yes    Birth control/protection: None  Other Topics Concern  . Not on file  Social History Narrative  . Not on file   Social Determinants of Health   Financial Resource Strain:   . Difficulty of Paying Living Expenses: Not on file  Food Insecurity:   . Worried About Charity fundraiser in the Last Year: Not on file  . Ran Out of Food in the Last Year: Not on file  Transportation Needs:   . Lack of Transportation (Medical): Not on file  . Lack of Transportation (Non-Medical): Not on file  Physical Activity:   . Days of Exercise per Week: Not on file  . Minutes of Exercise per Session: Not on file  Stress:   . Feeling of Stress : Not on file  Social Connections:   . Frequency of Communication with Friends and Family: Not on file  . Frequency of Social Gatherings with Friends and Family: Not on file  . Attends Religious Services: Not on file  . Active Member of Clubs or Organizations: Not on file  . Attends Archivist Meetings: Not on file  . Marital Status: Not on file     Family History: The patient's family history includes Cancer in her paternal aunt; Cancer (age of onset: 70) in her paternal aunt; Cancer (age of onset: 58) in her mother; Cancer (age of onset: 66) in her father; Diabetes in her father and mother; Heart disease in an other family member; Hypertension in her father and mother.  ROS:   Please see the history of present illness.   All other systems reviewed and are negative.  EKGs/Labs/Other Studies Reviewed:    The following studies were reviewed today: Echo June 2016- EF 50-55% with grade 1 DD  EKG:  EKG is ordered today.  The ekg ordered today  demonstrates NSR, frequent PVCs  Recent Labs: 12/15/2018: ALT 15; BUN 12; Creatinine, Ser 0.89; Hemoglobin 14.2; Platelets 243; Potassium 4.4; Sodium 139  Recent Lipid Panel    Component Value Date/Time   CHOL 161 10/05/2014 1545   TRIG 98 10/05/2014 1545   HDL 63 10/05/2014 1545   CHOLHDL 2.6 10/05/2014 1545   VLDL 20 10/05/2014 1545   LDLCALC 78 10/05/2014 1545    Physical Exam:    VS:  BP (!) 155/100   Pulse 94   Temp (!) 97.2 F (36.2  C)   Ht 5\' 5"  (1.651 m)   Wt 186 lb (84.4 kg)   LMP 03/16/2016 (Approximate)   BMI 30.95 kg/m     Wt Readings from Last 3 Encounters:  11/07/19 186 lb (84.4 kg)  12/15/18 191 lb 12.8 oz (87 kg)  11/01/18 192 lb (87.1 kg)     GEN:  Well nourished, well developed in no acute distress HEENT: Normal NECK: No JVD; No carotid bruits CARDIAC: RRR, no murmurs, rubs, gallops RESPIRATORY:  Clear to auscultation without rales, wheezing or rhonchi  ABDOMEN: Soft, non-tender, non-distended MUSCULOSKELETAL:  No edema; No deformity  SKIN: Warm and dry NEUROLOGIC:  Alert and oriented x 3 PSYCHIATRIC:  Normal affect   ASSESSMENT:    Nonischemic cardiomyopathy ([place]) NICM in Nov 2015 which resolved by June 2016  Insulin dependent type 2 diabetes mellitus, uncontrolled ([place]) Followed by PCP  Essential hypertension She may need an additional agent- will f/u in 2-3 weeks  History of breast cancer Lt breast lumpectomy followed by radiation- 2017  DJD (degenerative joint disease) of knee Need TKR but this has been on hold secondary to elevated BS  Frequent PVCs Asymptomatic.  Consider increasing her Coreg if her B/P is elevated at follow up.  I will also request labs from her PCP from Nov 2020.  PLAN:    Home B/P cuff provided.  Virtual F/U 2-3 weeks.    Addendum: Labs from PCP done 09/19/2019 reviewed- TSH WNL, LDL 51, BUN/SCr -9/0.68 Her Hgb A1c was 11.4   Medication Adjustments/Labs and Tests Ordered: Current medicines are reviewed  at length with the patient today.  Concerns regarding medicines are outlined above.  Orders Placed This Encounter  Procedures  . EKG 12-Lead   No orders of the defined types were placed in this encounter.   Patient Instructions  Medication Instructions:  Your physician recommends that you continue on your current medications as directed. Please refer to the Current Medication list given to you today. *If you need a refill on your cardiac medications before your next appointment, please call your pharmacy*  Lab Work: None  If you have labs (blood work) drawn today and your tests are completely normal, you will receive your results only by: Marland Kitchen MyChart Message (if you have MyChart) OR . A paper copy in the mail If you have any lab test that is abnormal or we need to change your treatment, we will call you to review the results.  Testing/Procedures: None   Follow-Up: At [facility], you and your health needs are our priority.  As part of our continuing mission to provide you with exceptional heart care, we have created designated Provider Care Teams.  These Care Teams include your primary Cardiologist (physician) and Advanced Practice Providers (APPs -  Physician Assistants and Nurse Practitioners) who all work together to provide you with the care you need, when you need it.  Your next appointment:   2-3 week(s)  The format for your next appointment:   Virtual Visit   Provider:   Kerin Ransom, PA-C  Other Instructions      Signed, Kerin Ransom, PA-C  11/07/2019 8:55 AM    [place]

## 2019-11-07 NOTE — Assessment & Plan Note (Signed)
Lt breast lumpectomy followed by radiation- 2017

## 2019-11-07 NOTE — Assessment & Plan Note (Signed)
Followed by PCP

## 2019-11-07 NOTE — Assessment & Plan Note (Signed)
Asymptomatic.  Consider increasing her Coreg if her B/P is elevated at follow up.  I will also request labs from her PCP from Nov 2020.

## 2019-11-07 NOTE — Assessment & Plan Note (Signed)
She may need an additional agent- will f/u in 2-3 weeks

## 2019-11-07 NOTE — Patient Instructions (Signed)
Medication Instructions:  Your physician recommends that you continue on your current medications as directed. Please refer to the Current Medication list given to you today. *If you need a refill on your cardiac medications before your next appointment, please call your pharmacy*  Lab Work: None  If you have labs (blood work) drawn today and your tests are completely normal, you will receive your results only by: Marland Kitchen MyChart Message (if you have MyChart) OR . A paper copy in the mail If you have any lab test that is abnormal or we need to change your treatment, we will call you to review the results.  Testing/Procedures: None   Follow-Up: At [facility], you and your health needs are our priority.  As part of our continuing mission to provide you with exceptional heart care, we have created designated Provider Care Teams.  These Care Teams include your primary Cardiologist (physician) and Advanced Practice Providers (APPs -  Physician Assistants and Nurse Practitioners) who all work together to provide you with the care you need, when you need it.  Your next appointment:   2-3 week(s)  The format for your next appointment:   Virtual Visit   Provider:   Kerin Ransom, PA-C  Other Instructions

## 2019-11-07 NOTE — Assessment & Plan Note (Signed)
Need TKR but this has been on hold secondary to elevated BS

## 2019-11-07 NOTE — Assessment & Plan Note (Signed)
NICM in Nov 2015 which resolved by June 2016

## 2019-11-22 ENCOUNTER — Other Ambulatory Visit: Payer: Self-pay | Admitting: Cardiology

## 2019-11-28 ENCOUNTER — Telehealth: Payer: Self-pay

## 2019-11-28 ENCOUNTER — Telehealth (INDEPENDENT_AMBULATORY_CARE_PROVIDER_SITE_OTHER): Payer: BC Managed Care – PPO | Admitting: Cardiology

## 2019-11-28 ENCOUNTER — Encounter: Payer: Self-pay | Admitting: Cardiology

## 2019-11-28 DIAGNOSIS — I493 Ventricular premature depolarization: Secondary | ICD-10-CM

## 2019-11-28 DIAGNOSIS — E1165 Type 2 diabetes mellitus with hyperglycemia: Secondary | ICD-10-CM

## 2019-11-28 DIAGNOSIS — I10 Essential (primary) hypertension: Secondary | ICD-10-CM | POA: Diagnosis not present

## 2019-11-28 DIAGNOSIS — I428 Other cardiomyopathies: Secondary | ICD-10-CM

## 2019-11-28 DIAGNOSIS — Z853 Personal history of malignant neoplasm of breast: Secondary | ICD-10-CM

## 2019-11-28 DIAGNOSIS — IMO0002 Reserved for concepts with insufficient information to code with codable children: Secondary | ICD-10-CM

## 2019-11-28 MED ORDER — CARVEDILOL 12.5 MG PO TABS: 18.7500 mg | ORAL_TABLET | Freq: Two times a day (BID) | ORAL | 3 refills | Status: DC

## 2019-11-28 NOTE — Addendum Note (Signed)
Addended by: Ulice Brilliant T on: 11/28/2019 11:09 AM   Modules accepted: Orders

## 2019-11-28 NOTE — Telephone Encounter (Signed)
Contacted patient to discuss AVS Instructions. Left detailed message on patients voice (PATIENT GAVE PERMISSION DURING WORK UP THAT IT WAS OK TO LEAVE A MESSAGE SINCE WE DID HER APPT ON HER MORNING BREAK FROM WORK) Left patient Luke's recommendations from today's virtual office visit. Informed patient that she should contact the office to schedule her follow up appt. Waiting for a call back and AVS mailed.

## 2019-11-28 NOTE — Progress Notes (Signed)
Virtual Visit via Telephone Note   This visit type was conducted due to national recommendations for restrictions regarding the COVID-19 Pandemic (e.g. social distancing) in an effort to limit this patient's exposure and mitigate transmission in our community.  Due to her co-morbid illnesses, this patient is at least at moderate risk for complications without adequate follow up.  This format is felt to be most appropriate for this patient at this time.  The patient did not have access to video technology/had technical difficulties with video requiring transitioning to audio format only (telephone).  All issues noted in this document were discussed and addressed.  No physical exam could be performed with this format.  Please refer to the patient's chart for her  consent to telehealth for [facility]   Date:  11/28/2019   ID:  Kristen Snyder, DOB [birth year], MRN [id number]  Patient Location: Home Provider Location: Home  PCP:  Lin Landsman, MD  Cardiologist:  Dr Stanford Breed Electrophysiologist:  None   Evaluation Performed:  Follow-Up Visit  Chief Complaint:  none  History of Present Illness:    Kristen Snyder is a 54 y.o. female with a past hx of CHF and NICM.  In 2015 she presented with CHF that had been treated as an OP as CAP.  She also had untreated hypertension. Echocardiogram in November 2015 showed an EF of 15%. Her medications were adjusted and she was followed by the heart failure clinic. Diagnostic catheterization in November 2015 showed normal coronaries. After she diuresed and her medications were titrated her EF improved to 50-55% by echo June 2016.Other medical problems include IDDM, breast cancer in 2017 treated with lumpectomy and radiation, and DJD.  She needs TKR but this has been on hold secondary to elevated BS which the patient tells me is related to Tamoxifen Rx.   I saw her in the office 11/07/2019.  She had been doing well but her blood pressure was  elevated.  I discussed this with her in the office and arranged for a home blood pressure cuff and asked her to monitor her blood pressure.  I am seeing her back virtually now in follow-up.  In reviewing her blood pressures they have run from 1 [id number] systolic with a diastolic of 0000000.  Her heart rate has ranged from 70-85.  She is otherwise asymptomatic.  The patient does not have symptoms concerning for COVID-19 infection (fever, chills, cough, or new shortness of breath).    Past Medical History:  Diagnosis Date  . Anxiety   . 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 Breast cancer ([place]) 2017   left  . Dental crown present   . 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 History of congestive heart failure 10/2014  . Hypertension    states under control with meds., has been on med. at least 59 yr.  . Insulin dependent diabetes mellitus   . Nonischemic cardiomyopathy ([place])   . Personal history of radiation therapy 2016   left   Past Surgical History:  Procedure Laterality Date  . BREAST LUMPECTOMY Left 2017  . BREAST LUMPECTOMY WITH NEEDLE LOCALIZATION Left 02/17/2016   Procedure: LEFT BREAST LUMPECTOMY WITH BRACKETED NEEDLE LOCALIZATION;  Surgeon: Excell Seltzer, MD;  Location: [facility];  Service: General;  Laterality: Left;  . EXCISION / BIOPSY BREAST / NIPPLE / DUCT Left 02/17/2016   lumpectomy 2017  . KNEE ARTHROSCOPY WITH ANTERIOR CRUCIATE LIGAMENT (ACL) REPAIR Right   .  LEFT AND RIGHT HEART CATHETERIZATION WITH CORONARY ANGIOGRAM [name] 10/08/2014   Procedure: LEFT AND RIGHT HEART CATHETERIZATION WITH CORONARY ANGIOGRAM;  Surgeon: Jettie Booze, MD;  Location: [facility] CATH LAB;  Service: Cardiovascular;  Laterality: [name];  . SHOULDER ARTHROSCOPY W/ ROTATOR CUFF REPAIR Right      Current Meds  Medication Sig  . APPLE CIDER VINEGAR PO Take by mouth. Drinks 1-2 cap fulls once a day  . carvedilol (COREG) 12.5 MG tablet Take 1 tablet  (12.5 mg total) by mouth 2 (two) times daily with a meal.  . furosemide (LASIX) 80 MG tablet Take 80 mg by mouth as needed. For weight gain  . LEVEMIR FLEXTOUCH 100 UNIT/ML Pen Inject 80 Units into the skin at bedtime.   Marland Kitchen lisinopril (PRINIVIL,ZESTRIL) 40 MG tablet Take 40 mg by mouth daily.  . Multiple Vitamin (MULTIVITAMIN) tablet Take 1 tablet by mouth daily.  . rosuvastatin (CRESTOR) 20 MG tablet Take 20 mg by mouth daily.  Marland Kitchen spironolactone (ALDACTONE) 25 MG tablet Take 1 tablet (25 mg total) by mouth daily.  . tamoxifen (NOLVADEX) 20 MG tablet TAKE 1 TABLET BY MOUTH EVERY DAY     Allergies:   Bidil [isosorb dinitrate-hydralazine]   Social History   Tobacco Use  . Smoking status: Never Smoker  . Smokeless tobacco: Never Used  Substance Use Topics  . Alcohol use: No    Alcohol/week: 1.0 standard drinks    Types: 1 Glasses of wine per week    Comment: occasionally  . Drug use: No     Family Hx: The patient's family history includes Cancer in her paternal aunt; Cancer (age of onset: 59) in her paternal aunt; Cancer (age of onset: 44) in her mother; Cancer (age of onset: 16) in her father; Diabetes in her father and mother; Heart disease in an other family member; Hypertension in her father and mother.  ROS:   Please see the history of present illness.    All other systems reviewed and are negative.   Prior CV studies:   The following studies were reviewed today: Echo 2016  Labs/Other Tests and Data Reviewed:    EKG:  An ECG dated 11/07/2019 was personally reviewed today and demonstrated:  NSR, RAE, frequent PVCs  Recent Labs: 12/15/2018: ALT 15; BUN 12; Creatinine, Ser 0.89; Hemoglobin 14.2; Platelets 243; Potassium 4.4; Sodium 139   Recent Lipid Panel Lab Results  Component Value Date/Time   CHOL 161 10/05/2014 03:45 PM   TRIG 98 10/05/2014 03:45 PM   HDL 63 10/05/2014 03:45 PM   CHOLHDL 2.6 10/05/2014 03:45 PM   LDLCALC 78 10/05/2014 03:45 PM    Wt Readings  from Last 3 Encounters:  11/07/19 186 lb (84.4 kg)  12/15/18 191 lb 12.8 oz (87 kg)  11/01/18 192 lb (87.1 kg)     Objective:    Vital Signs:  LMP 03/16/2016 (Approximate)    VITAL SIGNS:  reviewed  ASSESSMENT & PLAN:    Uncontrolled hypertension- She needs to be closer to 130/80.  I suggested she increase her Coreg to 18.5 mg BID- she was not anxious to add another agent.   Nonischemic cardiomyopathy ([place]) NICM in Nov 2015 which resolved by June 2016  Insulin dependent type 2 diabetes mellitus, uncontrolled ([place]) Followed by PCP  History of breast cancer Lt breast lumpectomy followed by radiation- 2017  DJD (degenerative joint disease) of knee Need TKR but this has been on hold secondary to elevated BS  Frequent PVCs Asymptomatic.  Coreg increased  today.  Plan: Increase Coreg to 18.75 mg BID.  F/U in 3 weeks.  If her B/P remains high I would add Amlodipine.   COVID-19 Education: The signs and symptoms of COVID-19 were discussed with the patient and how to seek care for testing (follow up with PCP or arrange E-visit).  The importance of social distancing was discussed today.  Time:   Today, I have spent 15 minutes with the patient with telehealth technology discussing the above problems.     Medication Adjustments/Labs and Tests Ordered: Current medicines are reviewed at length with the patient today.  Concerns regarding medicines are outlined above.   Tests Ordered: No orders of the defined types were placed in this encounter.   Medication Changes: No orders of the defined types were placed in this encounter.   Follow Up:  Virtual Visit  with me 3 weeks.   Angelena Form, PA-C  11/28/2019 10:19 AM    [facility]

## 2019-11-28 NOTE — Patient Instructions (Signed)
Medication Instructions:  INCREASE Coreg to 18.75mg  Take 1 and half tablet of 12.5mg  tablet twice a day *If you need a refill on your cardiac medications before your next appointment, please call your pharmacy*  Lab Work: None  If you have labs (blood work) drawn today and your tests are completely normal, you will receive your results only by: Marland Kitchen MyChart Message (if you have MyChart) OR . A paper copy in the mail If you have any lab test that is abnormal or we need to change your treatment, we will call you to review the results.  Testing/Procedures: None   Follow-Up: At [facility], you and your health needs are our priority.  As part of our continuing mission to provide you with exceptional heart care, we have created designated Provider Care Teams.  These Care Teams include your primary Cardiologist (physician) and Advanced Practice Providers (APPs -  Physician Assistants and Nurse Practitioners) who all work together to provide you with the care you need, when you need it.  Your next appointment:   3 week(s)  The format for your next appointment:   Virtual Visit   Provider:   Kerin Ransom, PA-C  Other Instructions Please call the office to schedule your follow up virtual appointment.

## 2019-12-05 ENCOUNTER — Other Ambulatory Visit: Payer: Self-pay | Admitting: Hematology

## 2019-12-05 DIAGNOSIS — Z853 Personal history of malignant neoplasm of breast: Secondary | ICD-10-CM

## 2019-12-05 DIAGNOSIS — Z9889 Other specified postprocedural states: Secondary | ICD-10-CM

## 2019-12-11 NOTE — Progress Notes (Signed)
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 breast [facility])   Staging form: Breast, AJCC 7th Edition     Clinical: Stage 0 (Tis (DCIS), N0, M0) - Unsigned     Pathologic stage from 02/17/2016: Stage 0 (Tis (DCIS), N0, cM0) - Signed by Truitt Merle, MD on 03/08/2016     Breast cancer of lower-outer quadrant of left female breast ([place])  09/12/2015 Mammogram   Screening mammogram showed calcification in left breast    10/09/2015 Mammogram   diagnostoc mammo and US showed 3.0cm  loosely grouped calcification in the lower inner left breast are suspicious    10/23/2015 Initial Diagnosis   Breast cancer 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 Normal appearance of the right breast. No evidence of adenopathy.   01/24/2016 Imaging   MR breast b/l w wo contrast 01/24/2016 IMPRESSION: 1. Significantly less non mass enhancement in the 2 areas of the left breast previously identified. There is residual stippled non mass enhancement between the tissue marker clips in the lower inner quadrant and slight lower outer quadrant spanning approximately 3.8 cm (between the 2 coil shaped tissue marker clips, distance between the clips on the MRI approximating 2.6 cm, approximately 5 cm apart on the CC mammogram with compression). This corresponds to the  biopsy-proven foci of DCIS and ADH. 2. Residual non mass enhancement in the lower inner quadrant, posterior depth (biopsy-proven ALH) spanning approximately 2.4 cm. 3. No new abnormalities involving either breast. 4. No pathologic lymphadenopathy.   02/17/2016 Surgery   left breast lumpectomy   02/17/2016 Pathology Results   left breast lumpectomy showed low grade DCIS, less than 0.1cm, (+) ALH, DCIS focally 0.1cm to the inferior margin   03/23/2016 - 05/07/2016 Radiation Therapy   Left breast radiation   05/30/2016 - 12/15/2019 Anti-estrogen oral therapy   Tamoxifen 39m daily started on 05/30/2016.Stopped 12/15/19 due to poorly controlled DM.    01/04/2017 Mammogram   IMPRESSION: No evidence of malignancy in either breast.   01/05/2018 Mammogram   01/05/2018 Mammogram IMPRESSION: No evidence of malignancy within either breast. Stable postsurgical changes within the left breast.      CURRENT THERAPY:  Surveillance   INTERVAL HISTORY:  Kristen STORCKis here for a follow up of left breast DCIS. She was last seen by me 1 year ago. She presents to the clinic alone. She notes she is doing well. She denies nay new changes. She notes she is working from home currently. She notes she is taking Tamoxifen. She has concerns of this effecting her BG. She notes in 12/2018 she did a change in her diet to improve her DM but her A1c was still high.    REVIEW OF SYSTEMS:   Constitutional: Denies fevers, chills or abnormal weight loss Eyes: Denies blurriness  of vision Ears, nose, mouth, throat, and face: Denies mucositis or sore throat Respiratory: Denies cough, dyspnea or wheezes Cardiovascular: Denies palpitation, chest discomfort or lower extremity swelling Gastrointestinal:  Denies nausea, heartburn or change in bowel habits Skin: Denies abnormal skin rashes Lymphatics: Denies new lymphadenopathy or easy bruising Neurological:Denies numbness, tingling or new weaknesses Behavioral/Psych: Mood  is stable, no new changes  All other systems were reviewed with the patient and are negative.  MEDICAL HISTORY:  Past Medical History:  Diagnosis Date  . Anxiety   . 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 Breast cancer ([place]) 2017   left  . Dental crown present   . 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 History of congestive heart failure 10/2014  . Hypertension    states under control with meds., has been on med. at least 66 yr.  . Insulin dependent diabetes mellitus   . Nonischemic cardiomyopathy ([place])   . Personal history of radiation therapy 2016   left    SURGICAL HISTORY: Past Surgical History:  Procedure Laterality Date  . BREAST LUMPECTOMY Left 2017  . BREAST LUMPECTOMY WITH NEEDLE LOCALIZATION Left 02/17/2016   Procedure: LEFT BREAST LUMPECTOMY WITH BRACKETED NEEDLE LOCALIZATION;  Surgeon: Excell Seltzer, MD;  Location: [facility];  Service: General;  Laterality: Left;  . EXCISION / BIOPSY BREAST / NIPPLE / DUCT Left 02/17/2016   lumpectomy 2017  . KNEE ARTHROSCOPY WITH ANTERIOR CRUCIATE LIGAMENT (ACL) REPAIR Right   . LEFT AND RIGHT HEART CATHETERIZATION WITH CORONARY ANGIOGRAM [name] 10/08/2014   Procedure: LEFT AND RIGHT HEART CATHETERIZATION WITH CORONARY ANGIOGRAM;  Surgeon: Jettie Booze, MD;  Location: [facility] CATH LAB;  Service: Cardiovascular;  Laterality: [name];  . SHOULDER ARTHROSCOPY W/ ROTATOR CUFF REPAIR Right     I have reviewed the social history and family history with the patient and they are unchanged from previous note.  ALLERGIES:  is allergic to bidil [isosorb dinitrate-hydralazine].  MEDICATIONS:  Current Outpatient Medications  Medication Sig Dispense Refill  . APPLE CIDER VINEGAR PO Take by mouth. Drinks 1-2 cap fulls once a day    . carvedilol (COREG) 12.5 MG tablet Take 1.5 tablets (18.75 mg total) by mouth 2 (two) times daily with a meal. 180 tablet 3  . furosemide (LASIX) 80 MG tablet Take 80  mg by mouth as needed. For weight gain    . LEVEMIR FLEXTOUCH 100 UNIT/ML Pen Inject 80 Units into the skin at bedtime.   0  . lisinopril (PRINIVIL,ZESTRIL) 40 MG tablet Take 40 mg by mouth daily.  0  . Multiple Vitamin (MULTIVITAMIN) tablet Take 1 tablet by mouth daily.    . rosuvastatin (CRESTOR) 20 MG tablet Take 20 mg by mouth daily.    Marland Kitchen spironolactone (ALDACTONE) 25 MG tablet Take 1 tablet (25 mg total) by mouth daily. 90 tablet 3  . tamoxifen (NOLVADEX) 20 MG tablet TAKE 1 TABLET BY MOUTH EVERY DAY 90 tablet 1   No current facility-administered medications for this visit.    PHYSICAL EXAMINATION: ECOG PERFORMANCE STATUS: 0 - Asymptomatic  Vitals:   12/15/19 0814  BP: 133/74  Pulse: 78  Resp: 20  Temp: 98.7 F (37.1 C)  SpO2: 99%   Filed Weights   12/15/19 0814  Weight: 186 lb 3.2 oz (84.5 kg)    GENERAL:alert, no distress and comfortable SKIN: skin color, texture, turgor are normal, no rashes or significant lesions EYES: normal, Conjunctiva are pink and non-injected, sclera clear  NECK: supple, thyroid normal size, non-tender, without nodularity LYMPH:  no palpable lymphadenopathy in the cervical, axillary  LUNGS: clear to auscultation and percussion with normal breathing effort HEART: regular rate & rhythm and no murmurs and no lower extremity edema ABDOMEN:abdomen soft, non-tender and normal bowel sounds Musculoskeletal:no cyanosis of digits and no clubbing  NEURO: alert & oriented x 3 with fluent speech, no focal motor/sensory deficits BREAST: S/p left lumpectomy: Surgical incision healed well (+) Lumpy breast tissue around her incision. No palpable mass, nodules or adenopathy bilaterally. Breast exam benign.   LABORATORY DATA:  I have reviewed the data as listed CBC Latest Ref Rng & Units 12/15/2019 12/15/2018 12/15/2017  WBC 4.0 - 10.5 K/uL 4.1 4.1 3.3(L)  Hemoglobin 12.0 - 15.0 g/dL 14.0 14.2 13.4  Hematocrit 36.0 - 46.0 % 42.7 44.0 40.4  Platelets 150 - 400  K/uL 239 243 239     CMP Latest Ref Rng & Units 12/15/2019 12/15/2018 12/15/2017  Glucose 70 - 99 mg/dL 231(H) 248(H) 330(H)  BUN 6 - 20 mg/dL '13 12 10  ' Creatinine 0.44 - 1.00 mg/dL 0.80 0.89 0.80  Sodium 135 - 145 mmol/L 140 139 136  Potassium 3.5 - 5.1 mmol/L 4.2 4.4 4.2  Chloride 98 - 111 mmol/L 105 104 103  CO2 22 - 32 mmol/L '26 25 22  ' Calcium 8.9 - 10.3 mg/dL 8.8(L) 9.1 8.9  Total Protein 6.5 - 8.1 g/dL 6.9 7.0 6.9  Total Bilirubin 0.3 - 1.2 mg/dL 0.4 0.4 0.4  Alkaline Phos 38 - 126 U/L 62 70 92  AST 15 - 41 U/L 17 12(L) 11  ALT 0 - 44 U/L '20 15 9      ' RADIOGRAPHIC STUDIES: I have personally reviewed the radiological images as listed and agreed with the findings in the report. No results found.   ASSESSMENT & PLAN:  Kristen Snyder is a 54 y.o. female with    1. Breast cancer of lower-outer quadrant of left female breast, DCIS, intermediate to high grade, ER+ /PR+ -Diagnosed in 08/2015. Treated with left breast lumpectomy, radiation, and Tamoxifen. Currently on Tamoxifen, started in 2017.  -From a breast cancer standpoint She is clinically doing well. Lab reviewed, her CBC and CMP are within normal limits except BG 231 and Ca 8.8. Her physical exam and her 12/2018 mammogram were unremarkable. There is no clinical concern for recurrence. -Although she has tolerated Tamoxifen well her BG has been uncontrolled for a long time and I am concerned that Tamoxifen may have contributed to this. I discussed she did receive the main benefit of 3.5 years Tamoxifen, so I recommend her to stop Tamoxifen. She is agreeable.  -Continue surveillance. Next mammogram on 01/12/20. Will do baseline DEXA at same time.  -F/u 1 year    2. HTN, DM, CHF, poorly controlled hyperglycemia -f/u with PCP and continue medications  -She notes trying to alter diet in 2020 to improve her BG but A1c remained in 11 range.  -Although her Tamoxifen is not the main contributor to her uncontrolled DM, I will stop  her Tamoxifen.  -I advised her to better control her diet, increase exercise and to f/u with PCP more frequently for additional medication.    3. Bilateral Knee pain -She plans to have knee replacement after she controls her hyperglycemia -will monitor  Plan  -Stop Tamoxifen due to her poorly controlled DM  -Mammogram and DEXA in 12/2019 -Lab and F/u in 1 year with NP Lacie   No problem-specific Assessment &  Plan notes found for this encounter.   No orders of the defined types were placed in this encounter.  All questions were answered. The patient knows to call the clinic with any problems, questions or concerns. No barriers to learning was detected. The total time spent in the appointment was 30 minutes.     Truitt Merle, MD 12/15/2019   I, Joslyn Devon, am acting as scribe for Truitt Merle, MD.   I have reviewed the above documentation for accuracy and completeness, and I agree with the above.

## 2019-12-12 ENCOUNTER — Telehealth (INDEPENDENT_AMBULATORY_CARE_PROVIDER_SITE_OTHER): Payer: BC Managed Care – PPO | Admitting: Cardiology

## 2019-12-12 ENCOUNTER — Telehealth: Payer: Self-pay

## 2019-12-12 VITALS — BP 133/79 | HR 85 | Ht 65.0 in | Wt 185.0 lb

## 2019-12-12 DIAGNOSIS — I493 Ventricular premature depolarization: Secondary | ICD-10-CM

## 2019-12-12 DIAGNOSIS — I428 Other cardiomyopathies: Secondary | ICD-10-CM

## 2019-12-12 DIAGNOSIS — I10 Essential (primary) hypertension: Secondary | ICD-10-CM

## 2019-12-12 NOTE — Telephone Encounter (Signed)
Contacted patient to discuss AVS Instructions. Gave patient Kristen Snyder's recommendations from today's virtual office visit. Informed patient that someone from the scheduling dept will be in contact with them to schedule their follow up appt/follow up appointment has been scheduled. Patient voiced understanding; AVS printed and mailed to patient.    

## 2019-12-12 NOTE — Progress Notes (Signed)
Virtual Visit via Telephone Note   This visit type was conducted due to national recommendations for restrictions regarding the COVID-19 Pandemic (e.g. social distancing) in an effort to limit this patient's exposure and mitigate transmission in our community.  Due to her co-morbid illnesses, this patient is at least at moderate risk for complications without adequate follow up.  This format is felt to be most appropriate for this patient at this time.  The patient did not have access to video technology/had technical difficulties with video requiring transitioning to audio format only (telephone).  All issues noted in this document were discussed and addressed.  No physical exam could be performed with this format.  Please refer to the patient's chart for her  consent to telehealth for [facility].   Date:  12/12/2019   ID:  Kristen Snyder, DOB 05-Feb-1966, MRN [id number]  Patient Location: Home Provider Location: Office  PCP:  Lin Landsman, MD  Cardiologist:  Dr Stanford Breed Electrophysiologist:  None   Evaluation Performed:  Follow-Up Visit  Chief Complaint:  none  History of Present Illness:    Kristen Snyder is a 54 y.o. female with a hx of CHF and NICM.  In 2015 she presented with CHF that had been treated as an OP as CAP.  She also had untreated hypertension. Echocardiogram in November 2015 showed an EF of 15%. Her medications were added and she was followed by the heart failure clinic. Diagnostic catheterization in November 2015 showed normal coronaries. After she diuresed and her medications titrated her EF improved to 50-55% by echo June 2016.Other medical problems include IDDM, breast cancer in 2017 treated with lumpectomy and radiation, and DJD.   I saw her in the office 11/07/2019 for routine follow up.  Her B/P was high in the office.  I arranged for her to get a home B/P cuff and asked her to monitor her B/P for the next few weeks.  She was contacted today for follow  up.  Her B/P towards the beginning of the month was running 140/90 but the readings over the past two weeks improved- 120's/80's.  At this time I suggested she continue to follow her B/P a couple times a week and let us know if it trends higher.  I would probably add Amlodipine if needed.   The patient does not have symptoms concerning for COVID-19 infection (fever, chills, cough, or new shortness of breath).    Past Medical History:  Diagnosis Date  . Anxiety   . 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 Breast cancer ([place]) 2017   left  . Dental crown present   . 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 History of congestive heart failure 10/2014  . Hypertension    states under control with meds., has been on med. at least 59 yr.  . Insulin dependent diabetes mellitus   . Nonischemic cardiomyopathy ([place])   . Personal history of radiation therapy 2016   left   Past Surgical History:  Procedure Laterality Date  . BREAST LUMPECTOMY Left 2017  . BREAST LUMPECTOMY WITH NEEDLE LOCALIZATION Left 02/17/2016   Procedure: LEFT BREAST LUMPECTOMY WITH BRACKETED NEEDLE LOCALIZATION;  Surgeon: Excell Seltzer, MD;  Location: [place];  Service: General;  Laterality: Left;  . EXCISION / BIOPSY BREAST / NIPPLE / DUCT Left 02/17/2016   lumpectomy 2017  . KNEE ARTHROSCOPY WITH ANTERIOR CRUCIATE LIGAMENT (ACL) REPAIR Right   . LEFT AND RIGHT HEART  CATHETERIZATION WITH CORONARY ANGIOGRAM [name] 10/08/2014   Procedure: LEFT AND RIGHT HEART CATHETERIZATION WITH CORONARY ANGIOGRAM;  Surgeon: Jettie Booze, MD;  Location: [facility] CATH LAB;  Service: Cardiovascular;  Laterality: [name];  . SHOULDER ARTHROSCOPY W/ ROTATOR CUFF REPAIR Right      Current Meds  Medication Sig  . APPLE CIDER VINEGAR PO Take by mouth. Drinks 1-2 cap fulls once a day  . carvedilol (COREG) 12.5 MG tablet Take 1.5 tablets (18.75 mg total) by mouth 2 (two) times daily with a meal.  .  furosemide (LASIX) 80 MG tablet Take 80 mg by mouth as needed. For weight gain  . LEVEMIR FLEXTOUCH 100 UNIT/ML Pen Inject 80 Units into the skin at bedtime.   Marland Kitchen lisinopril (PRINIVIL,ZESTRIL) 40 MG tablet Take 40 mg by mouth daily.  . Multiple Vitamin (MULTIVITAMIN) tablet Take 1 tablet by mouth daily.  . rosuvastatin (CRESTOR) 20 MG tablet Take 20 mg by mouth daily.  Marland Kitchen spironolactone (ALDACTONE) 25 MG tablet Take 1 tablet (25 mg total) by mouth daily.  . tamoxifen (NOLVADEX) 20 MG tablet TAKE 1 TABLET BY MOUTH EVERY DAY     Allergies:   Bidil [isosorb dinitrate-hydralazine]   Social History   Tobacco Use  . Smoking status: Never Smoker  . Smokeless tobacco: Never Used  Substance Use Topics  . Alcohol use: No    Alcohol/week: 1.0 standard drinks    Types: 1 Glasses of wine per week    Comment: occasionally  . Drug use: No     Family Hx: The patient's family history includes Cancer in her paternal aunt; Cancer (age of onset: 75) in her paternal aunt; Cancer (age of onset: 26) in her mother; Cancer (age of onset: 73) in her father; Diabetes in her father and mother; Heart disease in an other family member; Hypertension in her father and mother.  ROS:   Please see the history of present illness.    All other systems reviewed and are negative.   Prior CV studies:   The following studies were reviewed today: Cath 2015 Echo 2016  Labs/Other Tests and Data Reviewed:    EKG:  No ECG reviewed.  Recent Labs: 12/15/2018: ALT 15; BUN 12; Creatinine, Ser 0.89; Hemoglobin 14.2; Platelets 243; Potassium 4.4; Sodium 139   Recent Lipid Panel Lab Results  Component Value Date/Time   CHOL 161 10/05/2014 03:45 PM   TRIG 98 10/05/2014 03:45 PM   HDL 63 10/05/2014 03:45 PM   CHOLHDL 2.6 10/05/2014 03:45 PM   LDLCALC 78 10/05/2014 03:45 PM    Wt Readings from Last 3 Encounters:  12/12/19 185 lb (83.9 kg)  11/07/19 186 lb (84.4 kg)  12/15/18 191 lb 12.8 oz (87 kg)     Objective:      Vital Signs:  BP 133/79   Pulse 85   Ht 5\' 5"  (1.651 m)   Wt 185 lb (83.9 kg)   LMP 03/16/2016 (Approximate)   BMI 30.79 kg/m    VITAL SIGNS:  reviewed  ASSESSMENT & PLAN:    Essential hypertension B/P appears to be stable at this point- she will continue to monitor and watch her sodium intake.   Nonischemic cardiomyopathy ([place]) NICM in Nov 2015 which resolved by June 2016  Insulin dependent type 2 diabetes mellitus, uncontrolled ([place]) Followed by PCP  History of breast cancer Lt breast lumpectomy followed by radiation- 2017  DJD (degenerative joint disease) of knee Need TKR but this has been on hold secondary to elevated BS  Frequent PVCs Asymptomatic.     COVID-19 Education: The signs and symptoms of COVID-19 were discussed with the patient and how to seek care for testing (follow up with PCP or arrange E-visit).  The importance of social distancing was discussed today.  Time:   Today, I have spent 10 minutes with the patient with telehealth technology discussing the above problems.     Medication Adjustments/Labs and Tests Ordered: Current medicines are reviewed at length with the patient today.  Concerns regarding medicines are outlined above.   Tests Ordered: No orders of the defined types were placed in this encounter.   Medication Changes: No orders of the defined types were placed in this encounter.   Follow Up:  In Person 6 months- Dr Stanford Breed  Signed, Kerin Ransom, PA-C  12/12/2019 10:32 AM    [place]

## 2019-12-12 NOTE — Patient Instructions (Signed)
Medication Instructions:  Your physician recommends that you continue on your current medications as directed. Please refer to the Current Medication list given to you today. *If you need a refill on your cardiac medications before your next appointment, please call your pharmacy*  Lab Work: None  If you have labs (blood work) drawn today and your tests are completely normal, you will receive your results only by: Marland Kitchen MyChart Message (if you have MyChart) OR . A paper copy in the mail If you have any lab test that is abnormal or we need to change your treatment, we will call you to review the results.  Testing/Procedures: None   Follow-Up: At [facility], you and your health needs are our priority.  As part of our continuing mission to provide you with exceptional heart care, we have created designated Provider Care Teams.  These Care Teams include your primary Cardiologist (physician) and Advanced Practice Providers (APPs -  Physician Assistants and Nurse Practitioners) who all work together to provide you with the care you need, when you need it.  Your next appointment:   6 month(s)  The format for your next appointment:   In Person  Provider:   Kirk Ruths, MD  Other Instructions

## 2019-12-15 ENCOUNTER — Encounter: Payer: Self-pay | Admitting: Hematology

## 2019-12-15 ENCOUNTER — Inpatient Hospital Stay: Payer: BC Managed Care – PPO | Attending: Hematology

## 2019-12-15 ENCOUNTER — Inpatient Hospital Stay (HOSPITAL_BASED_OUTPATIENT_CLINIC_OR_DEPARTMENT_OTHER): Payer: BC Managed Care – PPO | Admitting: Hematology

## 2019-12-15 ENCOUNTER — Other Ambulatory Visit: Payer: Self-pay

## 2019-12-15 VITALS — BP 133/74 | HR 78 | Temp 98.7°F | Resp 20 | Ht 65.0 in | Wt 186.2 lb

## 2019-12-15 DIAGNOSIS — E1165 Type 2 diabetes mellitus with hyperglycemia: Secondary | ICD-10-CM | POA: Diagnosis not present

## 2019-12-15 DIAGNOSIS — C50512 Malignant neoplasm of lower-outer quadrant of left female breast: Secondary | ICD-10-CM | POA: Diagnosis not present

## 2019-12-15 DIAGNOSIS — Z170 Estrogen receptor positive status [ER+]: Secondary | ICD-10-CM

## 2019-12-15 DIAGNOSIS — D0512 Intraductal carcinoma in situ of left breast: Secondary | ICD-10-CM | POA: Insufficient documentation

## 2019-12-15 DIAGNOSIS — Z794 Long term (current) use of insulin: Secondary | ICD-10-CM | POA: Diagnosis not present

## 2019-12-15 DIAGNOSIS — Z923 Personal history of irradiation: Secondary | ICD-10-CM | POA: Diagnosis not present

## 2019-12-15 DIAGNOSIS — Z79810 Long term (current) use of selective estrogen receptor modulators (SERMs): Secondary | ICD-10-CM | POA: Diagnosis not present

## 2019-12-15 DIAGNOSIS — I110 Hypertensive heart disease with heart failure: Secondary | ICD-10-CM | POA: Insufficient documentation

## 2019-12-15 DIAGNOSIS — I509 Heart failure, unspecified: Secondary | ICD-10-CM | POA: Insufficient documentation

## 2019-12-15 DIAGNOSIS — Z79899 Other long term (current) drug therapy: Secondary | ICD-10-CM | POA: Insufficient documentation

## 2019-12-15 LAB — COMPREHENSIVE METABOLIC PANEL
ALT: 20 U/L (ref 0–44)
AST: 17 U/L (ref 15–41)
Albumin: 4 g/dL (ref 3.5–5.0)
Alkaline Phosphatase: 62 U/L (ref 38–126)
Anion gap: 9 (ref 5–15)
BUN: 13 mg/dL (ref 6–20)
CO2: 26 mmol/L (ref 22–32)
Calcium: 8.8 mg/dL — ABNORMAL LOW (ref 8.9–10.3)
Chloride: 105 mmol/L (ref 98–111)
Creatinine, Ser: 0.8 mg/dL (ref 0.44–1.00)
GFR calc Af Amer: >60 mL/min (ref >60)
GFR calc non Af Amer: >60 mL/min (ref >60)
Glucose, Bld: 231 mg/dL — ABNORMAL HIGH (ref 70–99)
Potassium: 4.2 mmol/L (ref 3.5–5.1)
Sodium: 140 mmol/L (ref 135–145)
Total Bilirubin: 0.4 mg/dL (ref 0.3–1.2)
Total Protein: 6.9 g/dL (ref 6.5–8.1)

## 2019-12-15 LAB — CBC WITH DIFFERENTIAL/PLATELET
Abs Immature Granulocytes: 0.01 10*3/uL (ref 0.00–0.07)
Basophils Absolute: 0 10*3/uL (ref 0.0–0.1)
Basophils Relative: 1 %
Eosinophils Absolute: 0.1 10*3/uL (ref 0.0–0.5)
Eosinophils Relative: 3 %
HCT: 42.7 % (ref 36.0–46.0)
Hemoglobin: 14 g/dL (ref 12.0–15.0)
Immature Granulocytes: 0 %
Lymphocytes Relative: 36 %
Lymphs Abs: 1.4 10*3/uL (ref 0.7–4.0)
MCH: 26.3 pg (ref 26.0–34.0)
MCHC: 32.8 g/dL (ref 30.0–36.0)
MCV: 80.1 fL (ref 80.0–100.0)
Monocytes Absolute: 0.4 10*3/uL (ref 0.1–1.0)
Monocytes Relative: 11 %
Neutro Abs: 2 10*3/uL (ref 1.7–7.7)
Neutrophils Relative %: 49 %
Platelets: 239 10*3/uL (ref 150–400)
RBC: 5.33 MIL/uL — ABNORMAL HIGH (ref 3.87–5.11)
RDW: 13.9 % (ref 11.5–15.5)
WBC: 4.1 10*3/uL (ref 4.0–10.5)
nRBC: 0 % (ref 0.0–0.2)

## 2020-01-11 ENCOUNTER — Other Ambulatory Visit: Payer: Self-pay | Admitting: Hematology

## 2020-01-11 DIAGNOSIS — E2839 Other primary ovarian failure: Secondary | ICD-10-CM

## 2020-01-12 ENCOUNTER — Ambulatory Visit
Admission: RE | Admit: 2020-01-12 | Discharge: 2020-01-12 | Disposition: A | Discharge status: Home / Self Care | Payer: BC Managed Care – PPO | Source: Ambulatory Visit | Attending: Hematology | Admitting: Hematology

## 2020-01-12 ENCOUNTER — Other Ambulatory Visit: Payer: Self-pay

## 2020-01-12 DIAGNOSIS — Z13820 Encounter for screening for osteoporosis: Secondary | ICD-10-CM | POA: Diagnosis not present

## 2020-01-12 DIAGNOSIS — R928 Other abnormal and inconclusive findings on diagnostic imaging of breast: Secondary | ICD-10-CM | POA: Diagnosis not present

## 2020-01-12 DIAGNOSIS — Z853 Personal history of malignant neoplasm of breast: Secondary | ICD-10-CM

## 2020-01-12 DIAGNOSIS — E2839 Other primary ovarian failure: Secondary | ICD-10-CM

## 2020-01-12 DIAGNOSIS — Z780 Asymptomatic menopausal state: Secondary | ICD-10-CM | POA: Diagnosis not present

## 2020-01-12 DIAGNOSIS — Z9889 Other specified postprocedural states: Secondary | ICD-10-CM

## 2020-01-15 ENCOUNTER — Telehealth: Payer: Self-pay | Admitting: *Deleted

## 2020-01-15 NOTE — Telephone Encounter (Signed)
-----   Message from Truitt Merle, MD sent at 01/14/2020 10:42 PM EST ----- Please let pt know her DEXA from last week was normal, good news.   Thanks  Truitt Merle  01/14/2020

## 2020-01-15 NOTE — Telephone Encounter (Signed)
Called pt & informed of good DEXA result per Dr Ernestina Penna request.  She expressed appreciation for call.

## 2020-02-02 ENCOUNTER — Other Ambulatory Visit: Payer: Self-pay

## 2020-02-02 DIAGNOSIS — I5023 Acute on chronic systolic (congestive) heart failure: Secondary | ICD-10-CM

## 2020-02-02 MED ORDER — SPIRONOLACTONE 25 MG PO TABS: 25.0000 mg | ORAL_TABLET | Freq: Every day | ORAL | 3 refills | Status: DC

## 2020-02-06 ENCOUNTER — Telehealth: Payer: Self-pay | Admitting: *Deleted

## 2020-02-06 DIAGNOSIS — E119 Type 2 diabetes mellitus without complications: Secondary | ICD-10-CM | POA: Diagnosis not present

## 2020-02-06 DIAGNOSIS — I10 Essential (primary) hypertension: Secondary | ICD-10-CM | POA: Diagnosis not present

## 2020-02-06 DIAGNOSIS — M25561 Pain in right knee: Secondary | ICD-10-CM | POA: Diagnosis not present

## 2020-02-06 DIAGNOSIS — E782 Mixed hyperlipidemia: Secondary | ICD-10-CM | POA: Diagnosis not present

## 2020-02-06 NOTE — Telephone Encounter (Signed)
I agree with your plan, thanks.   Truitt Merle MD

## 2020-02-06 NOTE — Telephone Encounter (Signed)
Pt reports that she noticed pink blood in her underwear last night and saw slight blood after urinating this morning. Had a BM this morning and saw what she thought was blood in her stool. Went to PCP for regular check up today. Did not mention this to PCP as she hoped it would clear up. PCP did a UA. Encouraged her to call PCP and explain what is going on. To call us back with an update.

## 2020-02-13 ENCOUNTER — Other Ambulatory Visit: Payer: Self-pay

## 2020-02-13 ENCOUNTER — Encounter: Payer: Self-pay | Admitting: Obstetrics & Gynecology

## 2020-02-13 ENCOUNTER — Ambulatory Visit: Payer: BC Managed Care – PPO | Admitting: Obstetrics & Gynecology

## 2020-02-13 VITALS — BP 146/91 | HR 88 | Wt 188.9 lb

## 2020-02-13 DIAGNOSIS — N852 Hypertrophy of uterus: Secondary | ICD-10-CM | POA: Diagnosis not present

## 2020-02-13 DIAGNOSIS — N950 Postmenopausal bleeding: Secondary | ICD-10-CM

## 2020-02-13 NOTE — Progress Notes (Signed)
Pt is in the office for GYN visit. Pt states that she has not had a menstrual cycle in about 4 years years, but had bleeding last week for about 6 days.

## 2020-02-13 NOTE — Progress Notes (Signed)
Patient ID: Kristen Snyder, female   DOB: 05/04/1966, 54 y.o.   MRN: [id number]  Chief Complaint  Patient presents with  . GYN  PMP bleeding  HPI Kristen Snyder is a 54 y.o. female.  G1P1001 Patient's last menstrual period was 03/16/2016 (approximate). She last has a period 4-5 years ago and since then was treated for breast cancer. Her Tamoxifen was stopped in Jan after 4+ years. She has light to moderate vaginal bleeding last week and still sees some spotting. HPI  Past Medical History:  Diagnosis Date  . Anxiety   . 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 Breast cancer ([place]) 2017   left  . Dental crown present   . 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 History of congestive heart failure 10/2014  . Hypertension    states under control with meds., has been on med. at least 57 yr.  . Insulin dependent diabetes mellitus   . Nonischemic cardiomyopathy ([place])   . Personal history of radiation therapy 2016   left    Past Surgical History:  Procedure Laterality Date  . BREAST LUMPECTOMY Left 2017  . BREAST LUMPECTOMY WITH NEEDLE LOCALIZATION Left 02/17/2016   Procedure: LEFT BREAST LUMPECTOMY WITH BRACKETED NEEDLE LOCALIZATION;  Surgeon: Kristen Seltzer, MD;  Location: [place];  Service: General;  Laterality: Left;  . EXCISION / BIOPSY BREAST / NIPPLE / DUCT Left 02/17/2016   lumpectomy 2017  . KNEE ARTHROSCOPY WITH ANTERIOR CRUCIATE LIGAMENT (ACL) REPAIR Right   . LEFT AND RIGHT HEART CATHETERIZATION WITH CORONARY ANGIOGRAM [name] 10/08/2014   Procedure: LEFT AND RIGHT HEART CATHETERIZATION WITH CORONARY ANGIOGRAM;  Surgeon: Kristen Booze, MD;  Location: [facility] CATH LAB;  Service: Cardiovascular;  Laterality: [name];  . SHOULDER ARTHROSCOPY W/ ROTATOR CUFF REPAIR Right     Family History  Problem Relation Age of Onset  . Diabetes Mother   . Hypertension Mother   . Cancer Mother 77       colon  . Hypertension Father   .  Diabetes Father   . Cancer Father 51       prostate cancer   . Heart disease Other        No family history  . Cancer Paternal Aunt        breast cancer   . Cancer Paternal Aunt 49       ovarian cancer     Social History Social History   Tobacco Use  . Smoking status: Never Smoker  . Smokeless tobacco: Never Used  Substance Use Topics  . Alcohol use: No    Alcohol/week: 1.0 standard drinks    Types: 1 Glasses of wine per week    Comment: occasionally  . Drug use: No    Allergies  Allergen Reactions  . Bidil [Isosorb Dinitrate-Hydralazine] Other (See Comments)    DISORIENTATION; DIZZINESS    Current Outpatient Medications  Medication Sig Dispense Refill  . APPLE CIDER VINEGAR PO Take by mouth. Drinks 1-2 cap fulls once a day    . carvedilol (COREG) 12.5 MG tablet Take 1.5 tablets (18.75 mg total) by mouth 2 (two) times daily with a meal. 180 tablet 3  . furosemide (LASIX) 80 MG tablet Take 80 mg by mouth as needed. For weight gain    . LEVEMIR FLEXTOUCH 100 UNIT/ML Pen Inject 80 Units into the skin at bedtime.   0  . lisinopril (PRINIVIL,ZESTRIL) 40 MG tablet Take 40 mg by mouth  daily.  0  . Multiple Vitamin (MULTIVITAMIN) tablet Take 1 tablet by mouth daily.    . rosuvastatin (CRESTOR) 20 MG tablet Take 20 mg by mouth daily.    Marland Kitchen spironolactone (ALDACTONE) 25 MG tablet Take 1 tablet (25 mg total) by mouth daily. 90 tablet 3  . tamoxifen (NOLVADEX) 20 MG tablet TAKE 1 TABLET BY MOUTH EVERY DAY (Patient not taking: Reported on 02/13/2020) 90 tablet 1   No current facility-administered medications for this visit.    Review of Systems Review of Systems  Constitutional: Negative.   Respiratory: Negative.   Cardiovascular: Negative.   Gastrointestinal: Negative.   Genitourinary: Positive for menstrual problem, pelvic pain and vaginal bleeding.  Psychiatric/Behavioral: Negative.     Blood pressure (!) 146/91, pulse 88, weight 188 lb 14.4 oz (85.7 kg), last menstrual  period 03/16/2016.  Physical Exam Physical Exam Vitals and nursing note reviewed. Exam conducted with a chaperone present.  HENT:     Head: Normocephalic.  Pulmonary:     Effort: Pulmonary effort is normal.  Abdominal:     Palpations: Abdomen is soft.  Genitourinary:    Comments: Vulvar mild irritation, dark blood in vault, unable to visualize cervix, globular pelvic mass 14 week size filling pelvis. Abandoned endometrial biopsy as cervical os not seen  Neurological:     Mental Status: She is alert.     Data Reviewed Pap, pelvic US 2019  Assessment Patient Active Problem List   Diagnosis Date Noted  . Postmenopausal bleeding 02/13/2020  . DJD (degenerative joint disease) of knee 11/07/2019  . Frequent PVCs 11/07/2019  . Pre-operative cardiovascular examination 11/01/2018  . History of breast cancer 11/01/2018  . Gynecologic exam normal 12/28/2017  . Enlarged uterus 12/28/2017  . Normal coronary arteries 10/02/2016  . Breast cancer of lower-outer quadrant of left female breast ([place]) 12/02/2015  . Nonischemic cardiomyopathy ([place]) 12/03/2014  . Noncompliance 10/26/2014  . Chronic systolic heart failure ([place]) 10/26/2014  . Acute on chronic systolic heart failure, NYHA class 3 ([place]) 10/19/2014  . Essential hypertension 09/28/2014  . Hyperlipidemia 09/28/2014  . Insulin dependent type 2 diabetes mellitus, uncontrolled ([place]) 09/28/2014   Enlarged uterus - Plan: US PELVIC COMPLETE WITH TRANSVAGINAL  Postmenopausal bleeding - Plan: US PELVIC COMPLETE WITH TRANSVAGINAL    Plan Enlarged uterus - Plan: US PELVIC COMPLETE WITH TRANSVAGINAL  Postmenopausal bleeding - Plan: US PELVIC COMPLETE WITH TRANSVAGINAL RTC after US done   Kristen Snyder 02/13/2020, 9:37 AM

## 2020-02-13 NOTE — Patient Instructions (Signed)
Postmenopausal Bleeding  Postmenopausal bleeding is any bleeding that occurs after menopause. Menopause is when a woman's period stops. Any type of bleeding after menopause should be checked by your doctor. Treatment will depend on the cause. Follow these instructions at home:  Pay attention to any changes in your symptoms.  Avoid using tampons and douches as told by your doctor.  Change your pads regularly.  Get regular pelvic exams and Pap tests.  Take iron pills as told by your doctor.  Take over-the-counter and prescription medicines only as told by your doctor.  Keep all follow-up visits as told by your doctor. This is important. Contact a doctor if:  Your bleeding lasts for more than 1 week.  You have pain in your belly (abdomen).  You have bleeding during or after sex.  You have bleeding that happens more often than every 3 weeks. Get help right away if:  You have fever, chills, headache, dizziness, muscle aches, or bleeding.  You have very bad pain with bleeding.  You have clumps of blood (blood clots) coming from your vagina.  You have a lot of bleeding, and: ? You use more than 1 pad an hour. ? This kind of bleeding has never happened before.  You feel like you are going to pass out (faint). Summary  Any type of bleeding after menopause should be checked by your doctor.  Pay attention to any changes in your symptoms.  Keep all follow-up visits as told by your doctor. This information is not intended to replace advice given to you by your health care provider. Make sure you discuss any questions you have with your health care provider. Document Revised: 01/19/2019 Document Reviewed: 12/08/2016 Elsevier Patient Education  2020 [employer] Inc.  

## 2020-02-21 ENCOUNTER — Other Ambulatory Visit (HOSPITAL_COMMUNITY): Payer: BC Managed Care – PPO

## 2020-02-21 ENCOUNTER — Ambulatory Visit (HOSPITAL_COMMUNITY)
Admission: RE | Admit: 2020-02-21 | Discharge: 2020-02-21 | Disposition: A | Discharge status: Home / Self Care | Payer: BC Managed Care – PPO | Source: Ambulatory Visit | Attending: Obstetrics & Gynecology | Admitting: Obstetrics & Gynecology

## 2020-02-21 ENCOUNTER — Other Ambulatory Visit: Payer: Self-pay

## 2020-02-21 DIAGNOSIS — N950 Postmenopausal bleeding: Secondary | ICD-10-CM | POA: Diagnosis not present

## 2020-02-21 DIAGNOSIS — D259 Leiomyoma of uterus, unspecified: Secondary | ICD-10-CM | POA: Diagnosis not present

## 2020-02-21 DIAGNOSIS — N852 Hypertrophy of uterus: Secondary | ICD-10-CM

## 2020-02-21 NOTE — Progress Notes (Signed)
Fibroid uterus seen on [place], patient scheduled for appointment 4/14

## 2020-02-28 ENCOUNTER — Other Ambulatory Visit: Payer: Self-pay

## 2020-02-28 ENCOUNTER — Encounter: Payer: Self-pay | Admitting: Obstetrics & Gynecology

## 2020-02-28 ENCOUNTER — Ambulatory Visit: Payer: BC Managed Care – PPO | Admitting: Obstetrics & Gynecology

## 2020-02-28 VITALS — BP 136/86 | HR 97 | Wt 186.6 lb

## 2020-02-28 DIAGNOSIS — N852 Hypertrophy of uterus: Secondary | ICD-10-CM

## 2020-02-28 DIAGNOSIS — E1165 Type 2 diabetes mellitus with hyperglycemia: Secondary | ICD-10-CM | POA: Diagnosis not present

## 2020-02-28 DIAGNOSIS — Z794 Long term (current) use of insulin: Secondary | ICD-10-CM

## 2020-02-28 DIAGNOSIS — N950 Postmenopausal bleeding: Secondary | ICD-10-CM

## 2020-02-28 DIAGNOSIS — IMO0002 Reserved for concepts with insufficient information to code with codable children: Secondary | ICD-10-CM

## 2020-02-28 NOTE — Progress Notes (Signed)
Patient ID: Kristen Snyder, female   DOB: 09-Dec-1965, 54 y.o.   MRN: [id number]  Chief Complaint  Patient presents with  . Follow-up    HPI Kristen Snyder is a 54 y.o. female.  G1P1001 Patient's last menstrual period was 03/16/2016 (approximate). She had f/u US to monitoring her fibroid uterus. No vaginal bleeding now. HPI  Past Medical History:  Diagnosis Date  . Anxiety   . 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 Breast cancer ([place]) 2017   left  . Dental crown present   . 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 History of congestive heart failure 10/2014  . Hypertension    states under control with meds., has been on med. at least 58 yr.  . Insulin dependent diabetes mellitus   . Nonischemic cardiomyopathy ([place])   . Personal history of radiation therapy 2016   left    Past Surgical History:  Procedure Laterality Date  . BREAST LUMPECTOMY Left 2017  . BREAST LUMPECTOMY WITH NEEDLE LOCALIZATION Left 02/17/2016   Procedure: LEFT BREAST LUMPECTOMY WITH BRACKETED NEEDLE LOCALIZATION;  Surgeon: Excell Seltzer, MD;  Location: [facility];  Service: General;  Laterality: Left;  . EXCISION / BIOPSY BREAST / NIPPLE / DUCT Left 02/17/2016   lumpectomy 2017  . KNEE ARTHROSCOPY WITH ANTERIOR CRUCIATE LIGAMENT (ACL) REPAIR Right   . LEFT AND RIGHT HEART CATHETERIZATION WITH CORONARY ANGIOGRAM [name] 10/08/2014   Procedure: LEFT AND RIGHT HEART CATHETERIZATION WITH CORONARY ANGIOGRAM;  Surgeon: Jettie Booze, MD;  Location: [facility] CATH LAB;  Service: Cardiovascular;  Laterality: [name];  . SHOULDER ARTHROSCOPY W/ ROTATOR CUFF REPAIR Right     Family History  Problem Relation Age of Onset  . Diabetes Mother   . Hypertension Mother   . Cancer Mother 77       colon  . Hypertension Father   . Diabetes Father   . Cancer Father 40       prostate cancer   . Heart disease Other        No family history  . Cancer Paternal Aunt    breast cancer   . Cancer Paternal Aunt 37       ovarian cancer     Social History Social History   Tobacco Use  . Smoking status: Never Smoker  . Smokeless tobacco: Never Used  Substance Use Topics  . Alcohol use: No    Alcohol/week: 1.0 standard drinks    Types: 1 Glasses of wine per week    Comment: occasionally  . Drug use: No    Allergies  Allergen Reactions  . Bidil [Isosorb Dinitrate-Hydralazine] Other (See Comments)    DISORIENTATION; DIZZINESS    Current Outpatient Medications  Medication Sig Dispense Refill  . carvedilol (COREG) 12.5 MG tablet Take 1.5 tablets (18.75 mg total) by mouth 2 (two) times daily with a meal. 180 tablet 3  . furosemide (LASIX) 80 MG tablet Take 80 mg by mouth as needed. For weight gain    . LEVEMIR FLEXTOUCH 100 UNIT/ML Pen Inject 80 Units into the skin at bedtime.   0  . lisinopril (PRINIVIL,ZESTRIL) 40 MG tablet Take 40 mg by mouth daily.  0  . Multiple Vitamin (MULTIVITAMIN) tablet Take 1 tablet by mouth daily.    . rosuvastatin (CRESTOR) 20 MG tablet Take 20 mg by mouth daily.    Marland Kitchen spironolactone (ALDACTONE) 25 MG tablet Take 1 tablet (25 mg total) by mouth daily. 90 tablet  3  . tamoxifen (NOLVADEX) 20 MG tablet TAKE 1 TABLET BY MOUTH EVERY DAY 90 tablet 1  . APPLE CIDER VINEGAR PO Take by mouth. Drinks 1-2 cap fulls once a day     No current facility-administered medications for this visit.    Review of Systems Review of Systems  Constitutional: Negative.   Gastrointestinal: Positive for abdominal distention.  Genitourinary: Positive for menstrual problem and pelvic pain. Negative for difficulty urinating, vaginal bleeding and vaginal discharge.    Blood pressure 136/86, pulse 97, weight 186 lb 9.6 oz (84.6 kg), last menstrual period 03/16/2016.  Physical Exam Physical Exam Constitutional:      Appearance: She is not ill-appearing.  Cardiovascular:     Rate and Rhythm: Normal rate.  Pulmonary:     Effort: Pulmonary effort  is normal.  Neurological:     Mental Status: She is alert.  Psychiatric:        Mood and Affect: Mood normal.        Behavior: Behavior normal.     Data Reviewed  CLINICAL DATA:  Postmenopausal bleeding, enlarged uterus  EXAM: TRANSABDOMINAL AND TRANSVAGINAL ULTRASOUND OF PELVIS  TECHNIQUE: Both transabdominal and transvaginal ultrasound examinations of the pelvis were performed. Transabdominal technique was performed for global imaging of the pelvis including uterus, ovaries, adnexal regions, and pelvic cul-de-sac. It was necessary to proceed with endovaginal exam following the transabdominal exam to visualize the endometrium and ovaries.  COMPARISON:  08/01/2018  FINDINGS: Uterus  Measurements: 22.9 x 10.3 x 11.0 cm = volume: 1371 mL. Retroverted. Markedly enlarged and heterogeneous with multiple nodular areas of abnormal heterogeneous echogenicity consistent with multiple leiomyomata. Largest of these measure 8.8 x 8.0 x 9.6 cm at mid uterus and 5.8 x 5.6 x 7.0 cm at upper uterus. Smaller lesions are also present.  Endometrium  Obscured secondary to multiple uterine leiomyomata  Right ovary  Measurements: At 2.7 x 1.5 x 2.3 cm = volume: 4.8 mL. Normal morphology without mass  Left ovary  Measurements: 2.9 x 1.6 x 1.8 cm = volume: 4.4 mL. Normal morphology without mass  Other findings  Trace free pelvic fluid.  No adnexal masses.  IMPRESSION: Markedly enlarged and heterogeneous uterus containing multiple uterine leiomyomata up to 9.6 cm and 7.0 cm in greatest sizes.  Unremarkable ovaries and adnexa.  Endometrial complex is not visualized secondary to distortion of the uterus by multiple large leiomyomata.   Electronically Signed   By: Lavonia Dana M.D.   On: 02/21/2020 16:55 Assessment Large fibroid uterus Recent BTB after cessation of Tamoxifen H/o Diabetes poorly controlled Desires TAH BSO  Plan Patient desires surgical  management with TAH BSO.  The risks of surgery were discussed in detail with the patient including but not limited to: bleeding which may require transfusion or reoperation; infection which may require prolonged hospitalization or re-hospitalization and antibiotic therapy; injury to bowel, bladder, ureters and major vessels or other surrounding organs; need for additional procedures including laparotomy; thromboembolic phenomenon, incisional problems and other postoperative or anesthesia complications.  Patient was told that the likelihood that her condition and symptoms will be treated effectively with this surgical management was very high; the postoperative expectations were also discussed in detail. The patient also understands the alternative treatment options which were discussed in full. All questions were answered.  She was told that she will be contacted by our surgical scheduler regarding the time and date of her surgery; routine preoperative instructions will be given to her by the preoperative nursing team.  She is aware of need for preoperative COVID testing and subsequent quarantine from time of test to time of surgery; she will be given further preoperative instructions at that [place] screening visit. Printed patient education handouts about the procedure were given to the patient to review at home.  RTC for endometrial biopsy Medical clearance from her PCP requested    Emeterio Reeve 02/28/2020, 9:43 AM

## 2020-02-28 NOTE — Patient Instructions (Signed)
Hysterectomy Information  A hysterectomy is a surgery to remove your uterus. After surgery, you will no longer have periods. Also, you will no longer be able to get pregnant. Reasons for this surgery You may have this surgery if:  You have bleeding in your vagina: ? That is not normal. ? That does not stop, or that keeps coming back.  You have long-term (chronic) pain in your lower belly (pelvic area).  The lining of your uterus grows outside of the uterus (endometriosis).  The lining of your uterus grows in the muscle of the uterus (adenomyosis).  Your uterus falls down into your vagina (prolapse).  You have a growth in your uterus that causes problems (uterine fibroids).  You have cells that could turn into cancer (precancerous cells).  You have cancer of the uterus or cervix. Types of hysterectomies There are 3 types of hysterectomies. Depending on the type, the surgery will:  Remove the top part of the uterus (supracervical).  Remove the uterus and the cervix (total).  Remove the uterus, cervix, and tissue that holds the uterus in place (radical). Ways a hysterectomy can be done This surgery may be done in one of these ways:  A cut (incision) is made in the belly (abdomen). The uterus is taken out through the cut.  A cut is made in the vagina. The uterus is taken out through the cut.  Three or four cuts are made in the belly. A device with a camera is put through one of the cuts. The uterus is cut into pieces and taken out through the cuts or the vagina.  Three or four cuts are made in the belly. A device with a camera is put through one of the cuts. The uterus is taken out through the vagina.  Three or four cuts are made in the belly. A computer helps control the surgical tools. The uterus is cut into small pieces. The pieces are taken out through the cuts or through the vagina. Talk with your doctor about which way is best for you. Risks of hysterectomy Generally,  this surgery is safe. However, problems can happen, including:  Bleeding.  Needing donated blood (transfusion).  Blood clots.  Infection.  Damage to other structures or organs.  Allergic reactions.  Needing to switch to a different type of surgery. What to expect after surgery  You will be given pain medicine.  You will need to stay in the hospital for 1-2 days.  Follow your doctor's instructions about: ? Exercising. ? Driving. ? What activities are safe for you.  You will need to have someone with you at home for 3-5 days.  You will need to see your doctor after 2-4 weeks.  You may get hot flashes, have night sweats, and have trouble sleeping.  You may need to have Pap tests if your surgery was related to cancer. Talk with your doctor about how often you need Pap tests. Questions to ask your doctor  Do I need this surgery? Do I have other treatment options?  What are my options for this surgery?  What needs to be removed?  What are the risks?  What are the benefits?  How long will I need to stay in the hospital?  How long will I need to recover?  What symptoms can I expect after the procedure? Summary  A hysterectomy is a surgery to remove your uterus. After surgery, you will no longer have periods. Also, you will no longer be able to   get pregnant.  Talk with your doctor about which type of hysterectomy is best for you. This information is not intended to replace advice given to you by your health care provider. Make sure you discuss any questions you have with your health care provider. Document Revised: 01/05/2019 Document Reviewed: 02/02/2017 Elsevier Patient Education  2020 Elsevier Inc.  

## 2020-02-28 NOTE — Progress Notes (Signed)
Pt is here for f/u visit for [place] results. [place] on 02/21/20.

## 2020-03-28 ENCOUNTER — Telehealth: Payer: Self-pay

## 2020-03-28 NOTE — Telephone Encounter (Signed)
Questions answered about Endometrial biopsy

## 2020-04-01 ENCOUNTER — Other Ambulatory Visit: Payer: Self-pay

## 2020-04-01 ENCOUNTER — Ambulatory Visit: Payer: BC Managed Care – PPO | Admitting: Obstetrics & Gynecology

## 2020-04-01 ENCOUNTER — Encounter: Payer: Self-pay | Admitting: Obstetrics & Gynecology

## 2020-04-01 VITALS — BP 144/88 | HR 94 | Wt 184.8 lb

## 2020-04-01 DIAGNOSIS — N950 Postmenopausal bleeding: Secondary | ICD-10-CM

## 2020-04-01 DIAGNOSIS — Z3202 Encounter for pregnancy test, result negative: Secondary | ICD-10-CM | POA: Diagnosis not present

## 2020-04-01 DIAGNOSIS — D259 Leiomyoma of uterus, unspecified: Secondary | ICD-10-CM

## 2020-04-01 LAB — POCT URINE PREGNANCY: Preg Test, Ur: NEGATIVE

## 2020-04-01 NOTE — Progress Notes (Signed)
  Subjective:     Patient ID: Kristen Snyder, female   DOB: 04-14-1966, 54 y.o.   MRN: [id number] Cc: enlarged uterus HPI G1P1001 Patient's last menstrual period was 03/16/2016 (approximate). She comes for EMBx preop for TAH for large fibroids. No vaginal bleeding now. She was counseled and signed consent.  Review of Systems  Genitourinary: Positive for menstrual problem. Negative for vaginal bleeding and vaginal discharge.       Objective:   Physical Exam Genitourinary:    General: Normal vulva.     Comments: 16 week size uterus with cervix posterior and inaccessible for biopsy. Mass is not mobile and bulges into the pelvis, smooth      CLINICAL DATA:  Postmenopausal bleeding, enlarged uterus  EXAM: TRANSABDOMINAL AND TRANSVAGINAL ULTRASOUND OF PELVIS  TECHNIQUE: Both transabdominal and transvaginal ultrasound examinations of the pelvis were performed. Transabdominal technique was performed for global imaging of the pelvis including uterus, ovaries, adnexal regions, and pelvic cul-de-sac. It was necessary to proceed with endovaginal exam following the transabdominal exam to visualize the endometrium and ovaries.  COMPARISON:  08/01/2018  FINDINGS: Uterus  Measurements: 22.9 x 10.3 x 11.0 cm = volume: 1371 mL. Retroverted. Markedly enlarged and heterogeneous with multiple nodular areas of abnormal heterogeneous echogenicity consistent with multiple leiomyomata. Largest of these measure 8.8 x 8.0 x 9.6 cm at mid uterus and 5.8 x 5.6 x 7.0 cm at upper uterus. Smaller lesions are also present.  Endometrium  Obscured secondary to multiple uterine leiomyomata  Right ovary  Measurements: At 2.7 x 1.5 x 2.3 cm = volume: 4.8 mL. Normal morphology without mass  Left ovary  Measurements: 2.9 x 1.6 x 1.8 cm = volume: 4.4 mL. Normal morphology without mass  Other findings  Trace free pelvic fluid.  No adnexal masses.  IMPRESSION: Markedly enlarged  and heterogeneous uterus containing multiple uterine leiomyomata up to 9.6 cm and 7.0 cm in greatest sizes.  Unremarkable ovaries and adnexa.  Endometrial complex is not visualized secondary to distortion of the uterus by multiple large leiomyomata.   Electronically Signed   By: Lavonia Dana M.D.   On: 02/21/2020 16:55  Assessment:     Large fibroid uterus with cervix displaced, unable to to biopsy today    Plan:     Scheduled for TAH 6/29 15 minutes face to face and coordination of care Woodroe Mode, MD 04/01/2020

## 2020-04-01 NOTE — Progress Notes (Signed)
Pt is here for endometrial biopsy. Pt is post menopausal since 2017. Pt has not had any bleeding since the end of march 2021.

## 2020-04-01 NOTE — Patient Instructions (Signed)
Hysterectomy Information  A hysterectomy is a surgery in which the uterus is removed. The fallopian tubes and ovaries may be removed (bilateral salpingo-oophorectomy) as well. This procedure may be done to treat various medical problems. After the procedure, a woman will no longer have menstrual periods nor will she be able to become pregnant (sterile). What are the reasons for a hysterectomy? There are many reasons why a woman might have this procedure. They include:  Persistent, abnormal vaginal bleeding.  Long-term (chronic) pelvic pain or infection.  Endometriosis. This is when the lining of the uterus (endometrium) starts to grow outside the uterus.  Adenomyosis. This is when the endometrium starts to grow in the muscle of the uterus.  Pelvic organ prolapse. This is a condition in which the uterus falls down into the vagina.  Noncancerous growths in the uterus (uterine fibroids) that cause symptoms.  The presence of precancerous cells.  Cervical or uterine cancer. What are the different types of hysterectomy? There are three different types of hysterectomy:  Supracervical hysterectomy. In this type, the top part of the uterus is removed, but not the cervix.  Total hysterectomy. In this type, the uterus and cervix are removed.  Radical hysterectomy. In this type, the uterus, the cervix, and the tissue that holds the uterus in place (parametrium) are removed. What are the different ways a hysterectomy can be performed? There are many different ways a hysterectomy can be performed, including:  Abdominal hysterectomy. In this type, an incision is made in the abdomen. The uterus is removed through this incision.  Vaginal hysterectomy. In this type, an incision is made in the vagina. The uterus is removed through this incision. There are no abdominal incisions.  Conventional laparoscopic hysterectomy. In this type, three or four small incisions are made in the abdomen. A thin,  lighted tube with a camera (laparoscope) is inserted into one of the incisions. Other tools are put through the other incisions. The uterus is cut into small pieces. The small pieces are removed through the incisions or through the vagina.  Laparoscopically assisted vaginal hysterectomy (LAVH). In this type, three or four small incisions are made in the abdomen. Part of the surgery is performed laparoscopically and the other part is done vaginally. The uterus is removed through the vagina.  Robot-assisted laparoscopic hysterectomy. In this type, a laparoscope and other tools are inserted into three or four small incisions in the abdomen. A computer-controlled device is used to give the surgeon a 3D image and to help control the surgical instruments. This allows for more precise movements of surgical instruments. The uterus is cut into small pieces and removed through the incisions or removed through the vagina. Discuss the options with your health care provider to determine which type is the right one for you. What are the risks? Generally, this is a safe procedure. However, problems may occur, including:  Bleeding and risk of blood transfusion. Tell your health care provider if you do not want to receive any blood products.  Blood clots in the legs or lung.  Infection.  Damage to other structures or organs.  Allergic reactions to medicines.  Changing to an abdominal hysterectomy from one of the other techniques. What to expect after a hysterectomy  You will be given pain medicine.  You may need to stay in the hospital for 1- 2 days to recover, depending on the type of hysterectomy you had.  Follow your health care provider's instructions about exercise, driving, and general activities. Ask your   health care provider what activities are safe for you.  You will need to have someone with you for the first 3-5 days after you go home.  You will need to follow up with your surgeon in 2-4  weeks after surgery to evaluate your progress.  If the ovaries are removed, you will have early menopause symptoms such as hot flashes, night sweats, and insomnia.  If you had a hysterectomy for a problem that was not cancer or not a condition that could lead to cancer, then you no longer need Pap tests. However, even if you no longer need a Pap test, a regular pelvic exam is a good idea to make sure no other problems are developing. Questions to ask your health care provider  Is a hysterectomy medically necessary? Do I have other treatment options for my condition?  What are my options for hysterectomy procedure?  What organs and tissues need to be removed?  What are the risks?  What are the benefits?  How long will I need to stay in the hospital after the procedure?  How long will I need to recover at home?  What symptoms can I expect after the procedure? Summary  A hysterectomy is a surgery in which the uterus is removed. The fallopian tubes and ovaries may be removed (bilateral salpingo-oophorectomy) as well.  This procedure may be done to treat various medical problems. After the procedure, a woman will no longer have menstrual periods nor will she be able to become pregnant.  Discuss the options with your health care provider to determine which type of hysterectomy is the right one for you. This information is not intended to replace advice given to you by your health care provider. Make sure you discuss any questions you have with your health care provider. Document Revised: 10/15/2017 Document Reviewed: 12/09/2016 Elsevier Patient Education  2020 Elsevier Inc.  

## 2020-04-02 ENCOUNTER — Telehealth: Payer: Self-pay

## 2020-04-02 NOTE — Telephone Encounter (Signed)
Returned call and answered questions about last office visit

## 2020-04-26 DIAGNOSIS — Z3493 Encounter for supervision of normal pregnancy, unspecified, third trimester: Secondary | ICD-10-CM

## 2020-05-01 ENCOUNTER — Other Ambulatory Visit: Payer: Self-pay | Admitting: Obstetrics & Gynecology

## 2020-05-01 NOTE — Progress Notes (Signed)
Orders for surgery 

## 2020-05-06 NOTE — Pre-Procedure Instructions (Signed)
Your procedure is scheduled on Tuesday, June 29th from 07:30 AM- 08:50 AM.  Report to [facility] Main Entrance "A" at 05:30 A.M., and check in at the Admitting office.  Call this number if you have problems the morning of surgery:  [phone]  Call [phone] if you have any questions prior to your surgery date Monday-Friday 8am-4pm.    Remember:  Do not eat after midnight the night before your surgery.  You may drink clear liquids until 04:30 AM the morning of your surgery.   Clear liquids allowed are: Water, Non-Citrus Juices (without pulp), Carbonated Beverages, Clear Tea, Black Coffee Only, and Gatorade.    Take these medicines the morning of surgery with A SIP OF WATER: carvedilol (COREG)  rosuvastatin (CRESTOR)   As of today, STOP taking any Aspirin (unless otherwise instructed by your surgeon) and Aspirin containing products, Aleve, Naproxen, Ibuprofen, Motrin, Advil, Goody's, BC's, all herbal medications, fish oil, and all vitamins.   WHAT DO I DO ABOUT MY DIABETES MEDICATION?   . THE NIGHT BEFORE SURGERY, take 40 units of LEVEMIR insulin.        HOW TO MANAGE YOUR DIABETES BEFORE AND AFTER SURGERY  Why is it important to control my blood sugar before and after surgery? . Improving blood sugar levels before and after surgery helps healing and can limit problems. . A way of improving blood sugar control is eating a healthy diet by: o  Eating less sugar and carbohydrates o  Increasing activity/exercise o  Talking with your doctor about reaching your blood sugar goals . High blood sugars (greater than 180 mg/dL) can raise your risk of infections and slow your recovery, so you will need to focus on controlling your diabetes during the weeks before surgery. . Make sure that the doctor who takes care of your diabetes knows about your planned surgery including the date and location.  How do I manage my blood sugar before surgery? . Check your blood sugar at least 4  times a day, starting 2 days before surgery, to make sure that the level is not too high or low. . Check your blood sugar the morning of your surgery when you wake up and every 2 hours until you get to the Short Stay unit. o If your blood sugar is less than 70 mg/dL, you will need to treat for low blood sugar: - Do not take insulin. - Treat a low blood sugar (less than 70 mg/dL) with  cup of clear juice (cranberry or apple), 4 glucose tablets, OR glucose gel. - Recheck blood sugar in 15 minutes after treatment (to make sure it is greater than 70 mg/dL). If your blood sugar is not greater than 70 mg/dL on recheck, call [phone] for further instructions. . Report your blood sugar to the short stay nurse when you get to Short Stay.  . If you are admitted to the hospital after surgery: o Your blood sugar will be checked by the staff and you will probably be given insulin after surgery (instead of oral diabetes medicines) to make sure you have good blood sugar levels. o The goal for blood sugar control after surgery is 80-180 mg/dL.           The Morning of Surgery:            Do not wear jewelry, make up, or nail polish.            Do not wear lotions, powders, perfumes, or deodorant.  Do not shave 48 hours prior to surgery.             Do not bring valuables to the hospital.            [facility] is not responsible for any belongings or valuables.  Do NOT Smoke (Tobacco/Vapping) or drink Alcohol 24 hours prior to your procedure.  If you use a CPAP at night, you may bring all equipment for your overnight stay.   Contacts, glasses, dentures or bridgework may not be worn into surgery.      For patients admitted to the hospital, discharge time will be determined by your treatment team.   Patients discharged the day of surgery will not be allowed to drive home, and someone needs to stay with them for 24 hours.    Special instructions:   [facility]- Preparing For  Surgery  Before surgery, you can play an important role. Because skin is not sterile, your skin needs to be as free of germs as possible. You can reduce the number of germs on your skin by washing with CHG (chlorahexidine gluconate) Soap before surgery.  CHG is an antiseptic cleaner which kills germs and bonds with the skin to continue killing germs even after washing.    Oral Hygiene is also important to reduce your risk of infection.  Remember - BRUSH YOUR TEETH THE MORNING OF SURGERY WITH YOUR REGULAR TOOTHPASTE  Please do not use if you have an allergy to CHG or antibacterial soaps. If your skin becomes reddened/irritated stop using the CHG.  Do not shave (including legs and underarms) for at least 48 hours prior to first CHG shower. It is OK to shave your face.  Please follow these instructions carefully.   1. Shower the NIGHT BEFORE SURGERY and the MORNING OF SURGERY with CHG Soap.   2. If you chose to wash your hair, wash your hair first as usual with your normal shampoo.  3. After you shampoo, rinse your hair and body thoroughly to remove the shampoo.  4. Use CHG as you would any other liquid soap. You can apply CHG directly to the skin and wash gently with a scrungie or a clean washcloth.   5. Apply the CHG Soap to your body ONLY FROM THE NECK DOWN.  Do not use on open wounds or open sores. Avoid contact with your eyes, ears, mouth and genitals (private parts). Wash Face and genitals (private parts)  with your normal soap.   6. Wash thoroughly, paying special attention to the area where your surgery will be performed.  7. Thoroughly rinse your body with warm water from the neck down.  8. DO NOT shower/wash with your normal soap after using and rinsing off the CHG Soap.  9. Pat yourself dry with a CLEAN TOWEL.  10. Wear CLEAN PAJAMAS to bed the night before surgery, wear comfortable clothes the morning of surgery  11. Place CLEAN SHEETS on your bed the night of your first  shower and DO NOT SLEEP WITH PETS.   Day of Surgery: Shower with CHG Soap.  Do not apply any deodorants/lotions.  Please wear clean clothes to the hospital/surgery center.   Remember to brush your teeth WITH YOUR REGULAR TOOTHPASTE.   Please read over the following fact sheets that you were given.

## 2020-05-07 ENCOUNTER — Other Ambulatory Visit: Payer: Self-pay

## 2020-05-07 ENCOUNTER — Encounter (HOSPITAL_COMMUNITY): Payer: Self-pay

## 2020-05-07 ENCOUNTER — Encounter (HOSPITAL_COMMUNITY)
Admission: RE | Admit: 2020-05-07 | Discharge: 2020-05-07 | Disposition: A | Discharge status: Home / Self Care | Payer: BC Managed Care – PPO | Source: Ambulatory Visit | Attending: Obstetrics & Gynecology | Admitting: Obstetrics & Gynecology

## 2020-05-07 DIAGNOSIS — D259 Leiomyoma of uterus, unspecified: Secondary | ICD-10-CM | POA: Diagnosis not present

## 2020-05-07 DIAGNOSIS — I110 Hypertensive heart disease with heart failure: Secondary | ICD-10-CM | POA: Insufficient documentation

## 2020-05-07 DIAGNOSIS — Z01812 Encounter for preprocedural laboratory examination: Secondary | ICD-10-CM | POA: Insufficient documentation

## 2020-05-07 DIAGNOSIS — E1165 Type 2 diabetes mellitus with hyperglycemia: Secondary | ICD-10-CM | POA: Diagnosis not present

## 2020-05-07 DIAGNOSIS — Z853 Personal history of malignant neoplasm of breast: Secondary | ICD-10-CM | POA: Diagnosis not present

## 2020-05-07 DIAGNOSIS — Z79899 Other long term (current) drug therapy: Secondary | ICD-10-CM | POA: Diagnosis not present

## 2020-05-07 DIAGNOSIS — Z794 Long term (current) use of insulin: Secondary | ICD-10-CM | POA: Insufficient documentation

## 2020-05-07 DIAGNOSIS — I509 Heart failure, unspecified: Secondary | ICD-10-CM | POA: Diagnosis not present

## 2020-05-07 HISTORY — DX: Pneumonia, unspecified organism: J18.9

## 2020-05-07 LAB — CBC
HCT: 46.4 % — ABNORMAL HIGH (ref 36.0–46.0)
Hemoglobin: 15 g/dL (ref 12.0–15.0)
MCH: 25.8 pg — ABNORMAL LOW (ref 26.0–34.0)
MCHC: 32.3 g/dL (ref 30.0–36.0)
MCV: 79.9 fL — ABNORMAL LOW (ref 80.0–100.0)
Platelets: 251 10*3/uL (ref 150–400)
RBC: 5.81 MIL/uL — ABNORMAL HIGH (ref 3.87–5.11)
RDW: 13.6 % (ref 11.5–15.5)
WBC: 3.9 10*3/uL — ABNORMAL LOW (ref 4.0–10.5)
nRBC: 0 % (ref 0.0–0.2)

## 2020-05-07 LAB — BASIC METABOLIC PANEL
Anion gap: 10 (ref 5–15)
BUN: 10 mg/dL (ref 6–20)
CO2: 25 mmol/L (ref 22–32)
Calcium: 9.7 mg/dL (ref 8.9–10.3)
Chloride: 101 mmol/L (ref 98–111)
Creatinine, Ser: 0.56 mg/dL (ref 0.44–1.00)
GFR calc Af Amer: >60 mL/min (ref >60)
GFR calc non Af Amer: >60 mL/min (ref >60)
Glucose, Bld: 276 mg/dL — ABNORMAL HIGH (ref 70–99)
Potassium: 4.3 mmol/L (ref 3.5–5.1)
Sodium: 136 mmol/L (ref 135–145)

## 2020-05-07 LAB — NO BLOOD PRODUCTS

## 2020-05-07 LAB — GLUCOSE, CAPILLARY: Glucose-Capillary: 294 mg/dL — ABNORMAL HIGH (ref 70–99)

## 2020-05-07 NOTE — Progress Notes (Signed)
PCP - Lin Landsman, MD Cardiologist - Kerin Ransom, PA-C Oncologist- Truitt Merle, MD  PPM/ICD - Denies  Chest x-ray - [name] EKG - 11/07/19 Stress Test - Denies ECHO - 04/19/15 Cardiac Cath - 2015 by Dr. Irish Lack  Sleep Study - Denies  Fasting Blood Sugar: 119-140 Checks Blood Sugar qAM CBG at PAT appointment was 294. Per patient, she had breakfast (cheerios) this AM at 0645 and took 80 units Levemir @ 2130 last night. Per patinet, Meda Klinefelter, MD manages her blood sugar. A1C obtained  Blood Thinner Instructions: [name] Aspirin Instructions: [name]  ERAS Protcol - Yes PRE-SURGERY Ensure or G2- Not ordered  COVID TEST- 05/11/20   Anesthesia review: Yes, cardiac hx.  Patient denies shortness of breath, fever, cough and chest pain at PAT appointment   All instructions explained to the patient, with a verbal understanding of the material. Patient agrees to go over the instructions while at home for a better understanding. Patient also instructed to self quarantine after being tested for COVID-19. The opportunity to ask questions was provided.

## 2020-05-08 NOTE — Progress Notes (Signed)
Anesthesia Chart Review:   Case: [id number] Date/Time: 05/14/20 0715   Procedure: HYSTERECTOMY ABDOMINAL WITH SALPINGO-OOPHORECTOMY (Bilateral )   Anesthesia type: Choice   Pre-op diagnosis: Fibroids   Location: [facility] OR ROOM 07 / [place] OR   Surgeons: Woodroe Mode, MD      DISCUSSION:  Pt is a 54 year old with hx CHF, NICM (2015; resolved 2016), HTN, DM   Diabetes is uncontrolled.  - Glucose 276; HbA1c was 11.7 - I left voicemail for [facility] with these results and also routed them to Dr. Roselie Awkward   VS: BP (!) 142/87   Pulse 85   Temp 36.8 C (Oral)   Resp 18   Ht 5\' 4"  (1.626 m)   Wt 83.2 kg   LMP 03/16/2016 (Approximate)   SpO2 97%   BMI 31.50 kg/m    PROVIDERS: - PCP is Lin Landsman, MD  - Cardiologist is Kirk Ruths, MD. Last virtual office visit 12/12/19 with Kerin Ransom, [facility] - Oncologist is Truitt Merle, MD   LABS:  - Glucose 276; HbA1c was 11.7  (all labs ordered are listed, but only abnormal results are displayed)  Labs Reviewed  GLUCOSE, CAPILLARY - Abnormal; Notable for the following components:      Result Value   Glucose-Capillary 294 (*)    All other components within normal limits  BASIC METABOLIC PANEL - Abnormal; Notable for the following components:   Glucose, Bld 276 (*)    All other components within normal limits  CBC - Abnormal; Notable for the following components:   WBC 3.9 (*)    RBC 5.81 (*)    HCT 46.4 (*)    MCV 79.9 (*)    MCH 25.8 (*)    All other components within normal limits  HEMOGLOBIN A1C  NO BLOOD PRODUCTS    EKG 11/07/19: Sinus rhythm with frequent PVCs.  RA enlargement.   CV:  Echo 04/19/15:  - Left ventricle: Difficult apical windows. LVEF is approximately 50 to 55% with basal inferior hypokinesis. The cavity size was normal. Systolic function was normal. The estimated ejection fraction was in the range of 50% to 55%. Doppler parameters a   re consistent with abnormal left ventricular relaxation (grade 1 diastolic  dysfunction).   Cardiac cath 10/08/14:  1. Normal left main coronary artery. 2. Mild plaque in the mid left anterior descending artery. 3. Widely patent left circumflex artery and its branches. 4. Widely patent right coronary artery. 5. Severely decreased global left ventricular systolic function.  LVEDP 25 mmHg.  Ejection fraction 15%. 6. PA saturation 62%. Cardiac output 4.9 L/m. Cardiac index 2.69. Mild pulmonary hypertension.    Past Medical History:  Diagnosis Date  . Anxiety   . 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 Breast cancer ([place]) 2017   left  . CHF (congestive heart failure) ([place])   . Dental crown present   . 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 History of congestive heart failure 10/2014  . Hypertension    states under control with meds., has been on med. at least 75 yr.  . Insulin dependent diabetes mellitus   . Nonischemic cardiomyopathy ([place])   . Personal history of radiation therapy 2016   left  . Pneumonia     Past Surgical History:  Procedure Laterality Date  . BREAST LUMPECTOMY Left 2017  . BREAST LUMPECTOMY WITH NEEDLE LOCALIZATION Left 02/17/2016   Procedure: LEFT BREAST LUMPECTOMY WITH BRACKETED NEEDLE LOCALIZATION;  Surgeon:  Excell Seltzer, MD;  Location: [facility];  Service: General;  Laterality: Left;  . CARDIAC CATHETERIZATION    . EXCISION / BIOPSY BREAST / NIPPLE / DUCT Left 02/17/2016   lumpectomy 2017  . KNEE ARTHROSCOPY WITH ANTERIOR CRUCIATE LIGAMENT (ACL) REPAIR Right   . LEFT AND RIGHT HEART CATHETERIZATION WITH CORONARY ANGIOGRAM [name] 10/08/2014   Procedure: LEFT AND RIGHT HEART CATHETERIZATION WITH CORONARY ANGIOGRAM;  Surgeon: Jettie Booze, MD;  Location: [facility] CATH LAB;  Service: Cardiovascular;  Laterality: [name];  . SHOULDER ARTHROSCOPY W/ ROTATOR CUFF REPAIR Right     MEDICATIONS: . APPLE CIDER VINEGAR PO  . carvedilol (COREG) 12.5 MG tablet  . furosemide (LASIX) 80 MG tablet   . LEVEMIR FLEXTOUCH 100 UNIT/ML Pen  . lisinopril (PRINIVIL,ZESTRIL) 40 MG tablet  . Multiple Vitamin (MULTIVITAMIN) tablet  . rosuvastatin (CRESTOR) 20 MG tablet  . spironolactone (ALDACTONE) 25 MG tablet  . tamoxifen (NOLVADEX) 20 MG tablet   No current facility-administered medications for this encounter.    If glucose acceptable day of surgery, I anticipate pt can proceed with surgery as scheduled.  Willeen Cass, FNP-BC [facility] Short Stay Surgical Center/Anesthesiology Phone: [phone] 05/09/2020 3:02 PM

## 2020-05-09 DIAGNOSIS — I10 Essential (primary) hypertension: Secondary | ICD-10-CM | POA: Diagnosis not present

## 2020-05-09 DIAGNOSIS — E119 Type 2 diabetes mellitus without complications: Secondary | ICD-10-CM | POA: Diagnosis not present

## 2020-05-09 DIAGNOSIS — E782 Mixed hyperlipidemia: Secondary | ICD-10-CM | POA: Diagnosis not present

## 2020-05-09 NOTE — Anesthesia Preprocedure Evaluation (Addendum)
Anesthesia Evaluation  Patient identified by MRN, date of birth, ID band Patient awake    Reviewed: Allergy & Precautions, NPO status , Patient's Chart, lab work & pertinent test results  Airway Mallampati: II  TM Distance: >3 FB Neck ROM: Full    Dental no notable dental hx. (+) Missing, Loose, Dental Advisory Given,    Pulmonary neg pulmonary ROS,    Pulmonary exam normal breath sounds clear to auscultation       Cardiovascular hypertension, Pt. on medications negative cardio ROS Normal cardiovascular exam Rhythm:Regular Rate:Normal  TTE 2016 - Left ventricle: Difficult apical windows. LVEF is approximately  50 to 55% with basal inferior hypokinesis. The cavity size was  normal. Systolic function was normal. The estimated ejection  fraction was in the range of 50% to 55%. Doppler parameters are  consistent with abnormal left ventricular relaxation (grade 1  diastolic dysfunction).   Neuro/Psych PSYCHIATRIC DISORDERS Anxiety negative neurological ROS     GI/Hepatic negative GI ROS, Neg liver ROS,   Endo/Other  diabetes (A1C 11.7), Poorly Controlled, Insulin DependentHypothyroidism   Renal/GU negative Renal ROS  negative genitourinary   Musculoskeletal negative musculoskeletal ROS (+)   Abdominal   Peds  Hematology negative hematology ROS (+)   Anesthesia Other Findings   Reproductive/Obstetrics                           Anesthesia Physical Anesthesia Plan  ASA: III  Anesthesia Plan: General and Regional   Post-op Pain Management:  Regional for Post-op pain   Induction: Intravenous  PONV Risk Score and Plan: 3 and Midazolam, Dexamethasone and Ondansetron  Airway Management Planned: Oral ETT  Additional Equipment:   Intra-op Plan:   Post-operative Plan: Extubation in OR  Informed Consent: I have reviewed the patients History and Physical, chart, labs and discussed  the procedure including the risks, benefits and alternatives for the proposed anesthesia with the patient or authorized representative who has indicated his/her understanding and acceptance.     Dental advisory given  Plan Discussed with: CRNA  Anesthesia Plan Comments:      Anesthesia Quick Evaluation

## 2020-05-10 LAB — HEMOGLOBIN A1C
Hgb A1c MFr Bld: 11.9 % — ABNORMAL HIGH (ref 4.8–5.6)
Mean Plasma Glucose: 295 mg/dL

## 2020-05-11 ENCOUNTER — Other Ambulatory Visit (HOSPITAL_COMMUNITY)
Admission: RE | Admit: 2020-05-11 | Discharge: 2020-05-11 | Disposition: A | Discharge status: Home / Self Care | Payer: BC Managed Care – PPO | Source: Ambulatory Visit | Attending: Obstetrics & Gynecology | Admitting: Obstetrics & Gynecology

## 2020-05-11 DIAGNOSIS — N950 Postmenopausal bleeding: Secondary | ICD-10-CM | POA: Diagnosis not present

## 2020-05-11 DIAGNOSIS — I110 Hypertensive heart disease with heart failure: Secondary | ICD-10-CM | POA: Diagnosis not present

## 2020-05-11 DIAGNOSIS — Z833 Family history of diabetes mellitus: Secondary | ICD-10-CM | POA: Diagnosis not present

## 2020-05-11 DIAGNOSIS — Z8041 Family history of malignant neoplasm of ovary: Secondary | ICD-10-CM | POA: Diagnosis not present

## 2020-05-11 DIAGNOSIS — Z79899 Other long term (current) drug therapy: Secondary | ICD-10-CM | POA: Diagnosis not present

## 2020-05-11 DIAGNOSIS — I5023 Acute on chronic systolic (congestive) heart failure: Secondary | ICD-10-CM | POA: Diagnosis not present

## 2020-05-11 DIAGNOSIS — Z794 Long term (current) use of insulin: Secondary | ICD-10-CM | POA: Diagnosis not present

## 2020-05-11 DIAGNOSIS — Z8249 Family history of ischemic heart disease and other diseases of the circulatory system: Secondary | ICD-10-CM | POA: Diagnosis not present

## 2020-05-11 DIAGNOSIS — N83201 Unspecified ovarian cyst, right side: Secondary | ICD-10-CM | POA: Diagnosis not present

## 2020-05-11 DIAGNOSIS — Z01812 Encounter for preprocedural laboratory examination: Secondary | ICD-10-CM | POA: Insufficient documentation

## 2020-05-11 DIAGNOSIS — I509 Heart failure, unspecified: Secondary | ICD-10-CM | POA: Diagnosis not present

## 2020-05-11 DIAGNOSIS — Z20822 Contact with and (suspected) exposure to covid-19: Secondary | ICD-10-CM | POA: Diagnosis not present

## 2020-05-11 DIAGNOSIS — D259 Leiomyoma of uterus, unspecified: Secondary | ICD-10-CM | POA: Diagnosis not present

## 2020-05-11 DIAGNOSIS — Z853 Personal history of malignant neoplasm of breast: Secondary | ICD-10-CM | POA: Diagnosis not present

## 2020-05-11 DIAGNOSIS — G8918 Other acute postprocedural pain: Secondary | ICD-10-CM | POA: Diagnosis not present

## 2020-05-11 DIAGNOSIS — N939 Abnormal uterine and vaginal bleeding, unspecified: Secondary | ICD-10-CM | POA: Diagnosis not present

## 2020-05-11 DIAGNOSIS — D250 Submucous leiomyoma of uterus: Secondary | ICD-10-CM | POA: Diagnosis not present

## 2020-05-11 DIAGNOSIS — D251 Intramural leiomyoma of uterus: Secondary | ICD-10-CM | POA: Diagnosis not present

## 2020-05-11 DIAGNOSIS — Z803 Family history of malignant neoplasm of breast: Secondary | ICD-10-CM | POA: Diagnosis not present

## 2020-05-11 DIAGNOSIS — E119 Type 2 diabetes mellitus without complications: Secondary | ICD-10-CM | POA: Diagnosis not present

## 2020-05-11 DIAGNOSIS — Z8042 Family history of malignant neoplasm of prostate: Secondary | ICD-10-CM | POA: Diagnosis not present

## 2020-05-11 DIAGNOSIS — D252 Subserosal leiomyoma of uterus: Secondary | ICD-10-CM | POA: Diagnosis not present

## 2020-05-11 LAB — SARS CORONAVIRUS 2 (TAT 6-24 HRS): SARS Coronavirus 2: NEGATIVE

## 2020-05-14 ENCOUNTER — Encounter (HOSPITAL_COMMUNITY): Payer: Self-pay | Admitting: Obstetrics & Gynecology

## 2020-05-14 ENCOUNTER — Inpatient Hospital Stay (HOSPITAL_COMMUNITY): Payer: BC Managed Care – PPO | Admitting: Vascular Surgery

## 2020-05-14 ENCOUNTER — Other Ambulatory Visit: Payer: Self-pay

## 2020-05-14 ENCOUNTER — Encounter (HOSPITAL_COMMUNITY)
Admission: RE | Disposition: A | Discharge status: Home / Self Care | Payer: Self-pay | Source: Ambulatory Visit | Attending: Obstetrics & Gynecology

## 2020-05-14 ENCOUNTER — Inpatient Hospital Stay (HOSPITAL_COMMUNITY)
Admission: RE | Admit: 2020-05-14 | Discharge: 2020-05-16 | DRG: 743 | Disposition: A | Discharge status: Home / Self Care | Payer: BC Managed Care – PPO | Attending: Obstetrics & Gynecology | Admitting: Obstetrics & Gynecology

## 2020-05-14 DIAGNOSIS — N939 Abnormal uterine and vaginal bleeding, unspecified: Secondary | ICD-10-CM | POA: Diagnosis not present

## 2020-05-14 DIAGNOSIS — Z79899 Other long term (current) drug therapy: Secondary | ICD-10-CM | POA: Diagnosis not present

## 2020-05-14 DIAGNOSIS — Z8042 Family history of malignant neoplasm of prostate: Secondary | ICD-10-CM | POA: Diagnosis not present

## 2020-05-14 DIAGNOSIS — Z803 Family history of malignant neoplasm of breast: Secondary | ICD-10-CM | POA: Diagnosis not present

## 2020-05-14 DIAGNOSIS — I509 Heart failure, unspecified: Secondary | ICD-10-CM | POA: Diagnosis present

## 2020-05-14 DIAGNOSIS — Z794 Long term (current) use of insulin: Secondary | ICD-10-CM | POA: Diagnosis not present

## 2020-05-14 DIAGNOSIS — Z20822 Contact with and (suspected) exposure to covid-19: Secondary | ICD-10-CM | POA: Diagnosis present

## 2020-05-14 DIAGNOSIS — Z8249 Family history of ischemic heart disease and other diseases of the circulatory system: Secondary | ICD-10-CM

## 2020-05-14 DIAGNOSIS — Z8041 Family history of malignant neoplasm of ovary: Secondary | ICD-10-CM | POA: Diagnosis not present

## 2020-05-14 DIAGNOSIS — Z833 Family history of diabetes mellitus: Secondary | ICD-10-CM | POA: Diagnosis not present

## 2020-05-14 DIAGNOSIS — D250 Submucous leiomyoma of uterus: Secondary | ICD-10-CM

## 2020-05-14 DIAGNOSIS — E119 Type 2 diabetes mellitus without complications: Secondary | ICD-10-CM | POA: Diagnosis present

## 2020-05-14 DIAGNOSIS — D251 Intramural leiomyoma of uterus: Principal | ICD-10-CM | POA: Diagnosis present

## 2020-05-14 DIAGNOSIS — N950 Postmenopausal bleeding: Secondary | ICD-10-CM | POA: Diagnosis present

## 2020-05-14 DIAGNOSIS — I110 Hypertensive heart disease with heart failure: Secondary | ICD-10-CM | POA: Diagnosis present

## 2020-05-14 DIAGNOSIS — D259 Leiomyoma of uterus, unspecified: Secondary | ICD-10-CM | POA: Diagnosis present

## 2020-05-14 DIAGNOSIS — Z853 Personal history of malignant neoplasm of breast: Secondary | ICD-10-CM

## 2020-05-14 HISTORY — PX: ABDOMINAL HYSTERECTOMY: SHX81

## 2020-05-14 HISTORY — PX: HYSTERECTOMY ABDOMINAL WITH SALPINGO-OOPHORECTOMY: SHX6792

## 2020-05-14 LAB — GLUCOSE, CAPILLARY
Glucose-Capillary: 138 mg/dL — ABNORMAL HIGH (ref 70–99)
Glucose-Capillary: 175 mg/dL — ABNORMAL HIGH (ref 70–99)
Glucose-Capillary: 176 mg/dL — ABNORMAL HIGH (ref 70–99)
Glucose-Capillary: 245 mg/dL — ABNORMAL HIGH (ref 70–99)
Glucose-Capillary: 324 mg/dL — ABNORMAL HIGH (ref 70–99)

## 2020-05-14 LAB — TYPE AND SCREEN
ABO/RH(D): O NEG
Antibody Screen: NEGATIVE

## 2020-05-14 LAB — ABO/RH: ABO/RH(D): O NEG

## 2020-05-14 SURGERY — HYSTERECTOMY, ABDOMINAL, WITH SALPINGO-OOPHORECTOMY
Anesthesia: General | Site: Abdomen | Laterality: Bilateral

## 2020-05-14 MED ORDER — DEXAMETHASONE SODIUM PHOSPHATE 10 MG/ML IJ SOLN
INTRAMUSCULAR | Status: DC | PRN
  Administered 2020-05-14: 5 mg via INTRAVENOUS

## 2020-05-14 MED ORDER — GLYCOPYRROLATE PF 0.2 MG/ML IJ SOSY
PREFILLED_SYRINGE | INTRAMUSCULAR | Status: AC
  Filled 2020-05-14: qty 1

## 2020-05-14 MED ORDER — ACETAMINOPHEN 500 MG PO TABS
1000.0000 mg | ORAL_TABLET | Freq: Once | ORAL | Status: AC
  Administered 2020-05-14: 1000 mg via ORAL
  Filled 2020-05-14: qty 2

## 2020-05-14 MED ORDER — BUPIVACAINE HCL (PF) 0.5 % IJ SOLN
INTRAMUSCULAR | Status: AC
  Filled 2020-05-14: qty 30

## 2020-05-14 MED ORDER — KETAMINE HCL 50 MG/5ML IJ SOSY
PREFILLED_SYRINGE | INTRAMUSCULAR | Status: AC
  Filled 2020-05-14: qty 5

## 2020-05-14 MED ORDER — DEXAMETHASONE SODIUM PHOSPHATE 10 MG/ML IJ SOLN
INTRAMUSCULAR | Status: DC | PRN
Start: 2020-05-14 — End: 2020-05-14
  Administered 2020-05-14 (×2): 5 mg

## 2020-05-14 MED ORDER — LACTATED RINGERS IV SOLN: INTRAVENOUS | Status: DC

## 2020-05-14 MED ORDER — SUGAMMADEX SODIUM 200 MG/2ML IV SOLN
INTRAVENOUS | Status: DC | PRN
  Administered 2020-05-14: 200 mg via INTRAVENOUS

## 2020-05-14 MED ORDER — LIDOCAINE 2% (20 MG/ML) 5 ML SYRINGE
INTRAMUSCULAR | Status: AC
  Filled 2020-05-14: qty 5

## 2020-05-14 MED ORDER — BUPIVACAINE HCL (PF) 0.25 % IJ SOLN
INTRAMUSCULAR | Status: DC | PRN
Start: 2020-05-14 — End: 2020-05-14
  Administered 2020-05-14 (×2): 30 mL

## 2020-05-14 MED ORDER — MIDAZOLAM HCL 5 MG/5ML IJ SOLN
INTRAMUSCULAR | Status: DC | PRN
  Administered 2020-05-14: 2 mg via INTRAVENOUS

## 2020-05-14 MED ORDER — ROCURONIUM BROMIDE 10 MG/ML (PF) SYRINGE
PREFILLED_SYRINGE | INTRAVENOUS | Status: DC | PRN
  Administered 2020-05-14: 80 mg via INTRAVENOUS

## 2020-05-14 MED ORDER — LIDOCAINE IN D5W 4-5 MG/ML-% IV SOLN
INTRAVENOUS | Status: AC
  Filled 2020-05-14: qty 500

## 2020-05-14 MED ORDER — ONDANSETRON HCL 4 MG/2ML IJ SOLN: 4.0000 mg | Freq: Four times a day (QID) | INTRAMUSCULAR | Status: DC | PRN

## 2020-05-14 MED ORDER — PROPOFOL 10 MG/ML IV BOLUS
INTRAVENOUS | Status: DC | PRN
  Administered 2020-05-14: 150 mg via INTRAVENOUS

## 2020-05-14 MED ORDER — ONDANSETRON HCL 4 MG PO TABS: 4.0000 mg | ORAL_TABLET | Freq: Four times a day (QID) | ORAL | Status: DC | PRN

## 2020-05-14 MED ORDER — SODIUM CHLORIDE 0.9 % IV SOLN
INTRAVENOUS | Status: DC | PRN
  Administered 2020-05-14: 40 ug/min via INTRAVENOUS

## 2020-05-14 MED ORDER — HYDROMORPHONE 1 MG/ML IV SOLN
INTRAVENOUS | Status: AC
  Filled 2020-05-14: qty 30

## 2020-05-14 MED ORDER — GLYCOPYRROLATE PF 0.2 MG/ML IJ SOSY
PREFILLED_SYRINGE | INTRAMUSCULAR | Status: DC | PRN
  Administered 2020-05-14: .2 mg via INTRAVENOUS

## 2020-05-14 MED ORDER — EPHEDRINE 5 MG/ML INJ
INTRAVENOUS | Status: AC
  Filled 2020-05-14: qty 10

## 2020-05-14 MED ORDER — BUPIVACAINE HCL (PF) 0.5 % IJ SOLN
INTRAMUSCULAR | Status: DC | PRN
  Administered 2020-05-14: 30 mL

## 2020-05-14 MED ORDER — FENTANYL CITRATE (PF) 100 MCG/2ML IJ SOLN: 25.0000 ug | INTRAMUSCULAR | Status: DC | PRN

## 2020-05-14 MED ORDER — IBUPROFEN 800 MG PO TABS
800.0000 mg | ORAL_TABLET | Freq: Four times a day (QID) | ORAL | Status: DC
  Administered 2020-05-15 – 2020-05-16 (×3): 800 mg via ORAL
  Filled 2020-05-14 (×4): qty 1

## 2020-05-14 MED ORDER — ONDANSETRON HCL 4 MG/2ML IJ SOLN
INTRAMUSCULAR | Status: DC | PRN
  Administered 2020-05-14: 4 mg via INTRAVENOUS

## 2020-05-14 MED ORDER — ORAL CARE MOUTH RINSE: 15.0000 mL | Freq: Once | OROMUCOSAL | Status: AC

## 2020-05-14 MED ORDER — PROPOFOL 10 MG/ML IV BOLUS
INTRAVENOUS | Status: AC
  Filled 2020-05-14: qty 20

## 2020-05-14 MED ORDER — PHENYLEPHRINE HCL (PRESSORS) 10 MG/ML IV SOLN
INTRAVENOUS | Status: DC | PRN
  Administered 2020-05-14: 80 ug via INTRAVENOUS
  Administered 2020-05-14: 40 ug via INTRAVENOUS

## 2020-05-14 MED ORDER — LIDOCAINE 2% (20 MG/ML) 5 ML SYRINGE
INTRAMUSCULAR | Status: DC | PRN
  Administered 2020-05-14: 60 mg via INTRAVENOUS

## 2020-05-14 MED ORDER — HYDROMORPHONE 1 MG/ML IV SOLN
INTRAVENOUS | Status: DC
  Administered 2020-05-14: 0.4 mg via INTRAVENOUS
  Administered 2020-05-14: 30 mg via INTRAVENOUS
  Administered 2020-05-14: 0.2 mg via INTRAVENOUS
  Administered 2020-05-14: 1 mg via INTRAVENOUS
  Administered 2020-05-15: 0.2 mg via INTRAVENOUS
  Administered 2020-05-15: 0.4 mg via INTRAVENOUS

## 2020-05-14 MED ORDER — MIDAZOLAM HCL 2 MG/2ML IJ SOLN
INTRAMUSCULAR | Status: AC
  Filled 2020-05-14: qty 2

## 2020-05-14 MED ORDER — KETAMINE HCL 10 MG/ML IJ SOLN
INTRAMUSCULAR | Status: DC | PRN
  Administered 2020-05-14: 10 mg via INTRAVENOUS
  Administered 2020-05-14: 30 mg via INTRAVENOUS
  Administered 2020-05-14: 10 mg via INTRAVENOUS

## 2020-05-14 MED ORDER — INSULIN ASPART 100 UNIT/ML ~~LOC~~ SOLN
0.0000 [IU] | Freq: Three times a day (TID) | SUBCUTANEOUS | Status: DC
  Administered 2020-05-14: 3 [IU] via SUBCUTANEOUS
  Administered 2020-05-14: 5 [IU] via SUBCUTANEOUS
  Administered 2020-05-15: 8 [IU] via SUBCUTANEOUS
  Administered 2020-05-15 (×2): 5 [IU] via SUBCUTANEOUS
  Filled 2020-05-14: qty 0.15

## 2020-05-14 MED ORDER — GABAPENTIN 100 MG PO CAPS
100.0000 mg | ORAL_CAPSULE | Freq: Two times a day (BID) | ORAL | Status: DC
  Administered 2020-05-14 – 2020-05-16 (×5): 100 mg via ORAL
  Filled 2020-05-14 (×5): qty 1

## 2020-05-14 MED ORDER — KETOROLAC TROMETHAMINE 30 MG/ML IJ SOLN
30.0000 mg | Freq: Four times a day (QID) | INTRAMUSCULAR | Status: AC
  Administered 2020-05-14 – 2020-05-15 (×4): 30 mg via INTRAVENOUS
  Filled 2020-05-14 (×4): qty 1

## 2020-05-14 MED ORDER — DEXAMETHASONE SODIUM PHOSPHATE 10 MG/ML IJ SOLN: INTRAMUSCULAR | Status: DC | PRN

## 2020-05-14 MED ORDER — NALOXONE HCL 0.4 MG/ML IJ SOLN: 0.4000 mg | INTRAMUSCULAR | Status: DC | PRN

## 2020-05-14 MED ORDER — DIPHENHYDRAMINE HCL 50 MG/ML IJ SOLN: 12.5000 mg | Freq: Four times a day (QID) | INTRAMUSCULAR | Status: DC | PRN

## 2020-05-14 MED ORDER — DIPHENHYDRAMINE HCL 12.5 MG/5ML PO ELIX: 12.5000 mg | ORAL_SOLUTION | Freq: Four times a day (QID) | ORAL | Status: DC | PRN

## 2020-05-14 MED ORDER — CEFAZOLIN SODIUM-DEXTROSE 2-4 GM/100ML-% IV SOLN
2.0000 g | INTRAVENOUS | Status: AC
  Administered 2020-05-14: 2 g via INTRAVENOUS
  Filled 2020-05-14: qty 100

## 2020-05-14 MED ORDER — CHLORHEXIDINE GLUCONATE CLOTH 2 % EX PADS: 6.0000 | MEDICATED_PAD | Freq: Every day | CUTANEOUS | Status: DC

## 2020-05-14 MED ORDER — FENTANYL CITRATE (PF) 250 MCG/5ML IJ SOLN
INTRAMUSCULAR | Status: AC
  Filled 2020-05-14: qty 5

## 2020-05-14 MED ORDER — PHENYLEPHRINE 40 MCG/ML (10ML) SYRINGE FOR IV PUSH (FOR BLOOD PRESSURE SUPPORT)
PREFILLED_SYRINGE | INTRAVENOUS | Status: AC
  Filled 2020-05-14: qty 10

## 2020-05-14 MED ORDER — ONDANSETRON HCL 4 MG/2ML IJ SOLN
INTRAMUSCULAR | Status: AC
  Filled 2020-05-14: qty 2

## 2020-05-14 MED ORDER — SODIUM CHLORIDE 0.9% FLUSH: 9.0000 mL | INTRAVENOUS | Status: DC | PRN

## 2020-05-14 MED ORDER — CHLORHEXIDINE GLUCONATE 0.12 % MT SOLN
15.0000 mL | Freq: Once | OROMUCOSAL | Status: AC
  Administered 2020-05-14: 15 mL via OROMUCOSAL
  Filled 2020-05-14: qty 15

## 2020-05-14 MED ORDER — ROCURONIUM BROMIDE 10 MG/ML (PF) SYRINGE
PREFILLED_SYRINGE | INTRAVENOUS | Status: AC
  Filled 2020-05-14: qty 10

## 2020-05-14 MED ORDER — ENOXAPARIN SODIUM 40 MG/0.4ML ~~LOC~~ SOLN
40.0000 mg | SUBCUTANEOUS | Status: DC
  Administered 2020-05-15 – 2020-05-16 (×2): 40 mg via SUBCUTANEOUS
  Filled 2020-05-14 (×2): qty 0.4

## 2020-05-14 MED ORDER — EPHEDRINE SULFATE 50 MG/ML IJ SOLN
INTRAMUSCULAR | Status: DC | PRN
Start: 2020-05-14 — End: 2020-05-14
  Administered 2020-05-14: 5 mg via INTRAVENOUS

## 2020-05-14 MED ORDER — DEXAMETHASONE SODIUM PHOSPHATE 10 MG/ML IJ SOLN
INTRAMUSCULAR | Status: AC
  Filled 2020-05-14: qty 1

## 2020-05-14 MED ORDER — FENTANYL CITRATE (PF) 100 MCG/2ML IJ SOLN
INTRAMUSCULAR | Status: DC | PRN
  Administered 2020-05-14: 100 ug via INTRAVENOUS

## 2020-05-14 MED ORDER — LIDOCAINE IN D5W 4-5 MG/ML-% IV SOLN
INTRAVENOUS | Status: DC | PRN
Start: 2020-05-14 — End: 2020-05-14
  Administered 2020-05-14: 25 ug/kg/min via INTRAVENOUS

## 2020-05-14 SURGICAL SUPPLY — 34 items
CANISTER SUCT 3000ML PPV (MISCELLANEOUS) ×4 IMPLANT
COVER WAND RF STERILE (DRAPES) ×2 IMPLANT
DRAPE WARM FLUID 44X44 (DRAPES) IMPLANT
DRSG OPSITE POSTOP 4X10 (GAUZE/BANDAGES/DRESSINGS) ×2 IMPLANT
DURAPREP 26ML APPLICATOR (WOUND CARE) ×2 IMPLANT
GAUZE 4X4 16PLY RFD (DISPOSABLE) ×2 IMPLANT
GLOVE BIO SURGEON STRL SZ 6.5 (GLOVE) ×2 IMPLANT
GLOVE BIOGEL PI IND STRL 7.0 (GLOVE) ×3 IMPLANT
GLOVE BIOGEL PI INDICATOR 7.0 (GLOVE) ×3
GOWN STRL REUS W/ TWL LRG LVL3 (GOWN DISPOSABLE) ×3 IMPLANT
GOWN STRL REUS W/TWL LRG LVL3 (GOWN DISPOSABLE) ×6
HIBICLENS CHG 4% 4OZ BTL (MISCELLANEOUS) ×2 IMPLANT
KIT TURNOVER KIT B (KITS) ×2 IMPLANT
NEEDLE HYPO 22GX1.5 SAFETY (NEEDLE) ×2 IMPLANT
NS IRRIG 1000ML POUR BTL (IV SOLUTION) ×2 IMPLANT
PACK ABDOMINAL GYN (CUSTOM PROCEDURE TRAY) ×2 IMPLANT
PAD ARMBOARD 7.5X6 YLW CONV (MISCELLANEOUS) ×2 IMPLANT
PAD OB MATERNITY 4.3X12.25 (PERSONAL CARE ITEMS) ×2 IMPLANT
SPECIMEN JAR MEDIUM (MISCELLANEOUS) ×2 IMPLANT
SPONGE LAP 18X18 RF (DISPOSABLE) ×3 IMPLANT
STAPLER VISISTAT 35W (STAPLE) IMPLANT
SUT PLAIN 2 0 XLH (SUTURE) ×2 IMPLANT
SUT VIC AB 0 CT1 18XCR BRD8 (SUTURE) ×4 IMPLANT
SUT VIC AB 0 CT1 27 (SUTURE) ×2
SUT VIC AB 0 CT1 27XBRD ANBCTR (SUTURE) ×1 IMPLANT
SUT VIC AB 0 CT1 36 (SUTURE) ×4 IMPLANT
SUT VIC AB 0 CT1 8-18 (SUTURE) ×8
SUT VIC AB 2-0 CT1 27 (SUTURE) ×2
SUT VIC AB 2-0 CT1 TAPERPNT 27 (SUTURE) ×1 IMPLANT
SUT VIC AB 4-0 PS2 27 (SUTURE) ×2 IMPLANT
SUT VICRYL 0 TIES 12 18 (SUTURE) ×2 IMPLANT
SYR CONTROL 10ML LL (SYRINGE) ×2 IMPLANT
TOWEL GREEN STERILE FF (TOWEL DISPOSABLE) ×4 IMPLANT
TRAY FOLEY W/BAG SLVR 14FR (SET/KITS/TRAYS/PACK) ×2 IMPLANT

## 2020-05-14 NOTE — Anesthesia Procedure Notes (Signed)
Anesthesia Regional Block: TAP block   Pre-Anesthetic Checklist: ,, timeout performed, Correct Patient, Correct Site, Correct Laterality, Correct Procedure, Correct Position, site marked, Risks and benefits discussed, pre-op evaluation,  At surgeon's request and post-op pain management  Laterality: Left  Prep: Maximum Sterile Barrier Precautions used, chloraprep       Needles:  Injection technique: Single-shot  Needle Type: Echogenic Stimulator Needle     Needle Length: 9cm  Needle Gauge: 21     Additional Needles:   Narrative:  Start time: 05/14/2020 9:05 AM End time: 05/14/2020 9:15 AM Injection made incrementally with aspirations every 5 mL. Anesthesiologist: Lidia Collum, MD  Additional Notes: 2% Lidocaine skin wheel.

## 2020-05-14 NOTE — Op Note (Signed)
[name] PROCEDURE DATE: 05/14/2020  PREOPERATIVE DIAGNOSES:  Symptomatic fibroids, abnormal uterine bleeding POSTOPERATIVE DIAGNOSES:  The same SURGEON:  Dr. Emeterio Reeve ASSISTANT:  Dr. Lavonia Drafts.  An experienced assistant was required given the standard of surgical care given the complexity of the case.  This assistant was needed for exposure, dissection, suctioning, retraction, instrument exchange, and for overall help during the procedure. OPERATION:  Total abdominal hysterectomy,, Bilateral Salpingoohorectomy ANESTHESIA:  General endotracheal.  INDICATIONS: The patient is a 54 y.o. G1P1001 with the aforementioned diagnoses who desires definitive surgical management. On the preoperative visit, the risks, benefits, indications, and alternatives of the procedure were reviewed with the patient.  On the day of surgery, the risks of surgery were again discussed with the patient including but not limited to: bleeding which may require transfusion or reoperation; infection which may require antibiotics; injury to bowel, bladder, ureters or other surrounding organs; need for additional procedures; thromboembolic phenomenon, incisional problems and other postoperative/anesthesia complications. Written informed consent was obtained.    OPERATIVE FINDINGS: A 16 week size uterus with normal tubes and ovaries bilaterally.  ESTIMATED BLOOD LOSS: 300 ml FLUIDS:  1500 ml of Lactated Ringers URINE OUTPUT:  100 ml of clear yellow urine. SPECIMENS:  Uterus,cervix,  bilateral fallopian tubes (and ovaries) sent to pathology COMPLICATIONS:  None immediate.    DESCRIPTION OF PROCEDURE:  The patient received intravenous antibiotics and had sequential compression devices applied to her lower extremities while in the preoperative area.   She was taken to the operating room and placed under general anesthesia without difficulty.The abdomen and perineum were prepped and draped in a sterile manner,  and she was placed in a dorsal supine position.  A Foley catheter was inserted into the bladder and attached to constant drainage. After an adequate timeout was performed, a Pfannensteil skin incision was made. This incision was taken down to the fascia using electrocautery with care given to maintain good hemostasis. The fascia was incised in the midline and the fascial incision was then extended bilaterally using electrocautery without difficulty. The fascia was then dissected off the underlying rectus muscles using blunt and sharp dissection. The rectus muscles were split bluntly in the midline and the peritoneum entered sharply without complication. This peritoneal incision was then extended superiorly and inferiorly with care given to prevent bowel or bladder injury. Attention was then turned to the pelvis. The bowel was packed away with moist laparotomy sponges. The uterus at this point was noted to be mobilized and was delivered up out of the abdomen.  The round ligaments on each side were clamped, suture ligated with 0 Vicryl, and transected with electrocautery allowing entry into the broad ligament. Of note, all sutures used in this procedure are 0 Vicryl unless otherwise noted. The anterior and posterior leaves of the broad ligament were separated, and the ureters were inspected to be safely away from the area of dissection bilaterally.  A hole was created in the clear portion of the posterior broad ligament and the infundibulopelvic ligament clamped on the patient's right side. This pedicle was then clamped, cut, and doubly suture ligated with good hemostasis.  This procedure was repeated in an identical fashion on the opposite side, incorporating both ovaries and tubes in the pathology specimen, and allowing salpingoophorectomy.  A bladder flap was then created.  The bladder was then bluntly dissected off the lower uterine segment and cervix with good hemostasis noted. The uterine arteries were then  skeletonized bilaterally and then clamped, cut, and  doubly suture ligated with care given to prevent ureteral injury. The uterosacral ligaments were then clamped, cut, and ligated bilaterally.  Finally, the cardinal ligaments were clamped, cut, and ligated bilaterally.  Acutely curved clamps were placed across the vagina just under the cervix, and the specimen was amputated and sent to pathology. The vaginal cuff angles were closed with Heaney stiches with care given to incorporate the uterosacral-cardinal ligament pedicles on both sides. The middle of the vaginal cuff was closed with a series of interrupted figure-of-eight sutures with care given to incorporate the anterior pubocervical fascia and the posterior rectovaginal fascia.   The pelvis was irrigated and hemostasis was reconfirmed at all pedicles and along the pelvic sidewall.  The ureters were inspected and noted to be peristalsing bilaterally.  All laparotomy sponges and instruments were removed from the abdomen. The peritoneum was closed with a running stitch, and the fascia was also closed in a running fashion. The subcutaneous layer was reapproximated with 2-0 plain gut. The skin was closed with a 4-0 Vicryl subcuticular stitch. Sponge, lap, needle, and instrument counts were correct times two. The patient was taken to the recovery area awake, extubated and in stable condition.   Emeterio Reeve MD, [place] for [facility], [place]

## 2020-05-14 NOTE — Progress Notes (Signed)
Patient arrived to 6N31, aox4, VSS, c/o pain 7/10 on dilaudid pca. Assessed honeycomb dressing, clean, dry and intact. Oriented to room, use of call bell and placed phone within reach. Informed visitation policy. Will continue to monitor patient

## 2020-05-14 NOTE — Anesthesia Procedure Notes (Signed)
Procedure Name: Intubation Date/Time: 05/14/2020 7:46 AM Performed by: Alain Marion, CRNA Pre-anesthesia Checklist: Patient identified, Emergency Drugs available, Suction available and Patient being monitored Patient Re-evaluated:Patient Re-evaluated prior to induction Oxygen Delivery Method: Circle System Utilized Preoxygenation: Pre-oxygenation with 100% oxygen Induction Type: IV induction Ventilation: Mask ventilation without difficulty Laryngoscope Size: Mac and 3 Grade View: Grade II Tube type: Oral Tube size: 7.0 mm Number of attempts: 1 Airway Equipment and Method: Stylet Placement Confirmation: ETT inserted through vocal cords under direct vision,  positive ETCO2 and breath sounds checked- equal and bilateral Secured at: 22 cm Tube secured with: Tape Dental Injury: Teeth and Oropharynx as per pre-operative assessment  Comments: Performed by Reece Agar, SRNA under the supervision of CRNA and MDA

## 2020-05-14 NOTE — Transfer of Care (Signed)
Immediate Anesthesia Transfer of Care Note  Patient: KALIANN CORYELL  Procedure(s) Performed: HYSTERECTOMY ABDOMINAL WITH SALPINGO-OOPHORECTOMY (Bilateral Abdomen)  Patient Location: PACU  Anesthesia Type:General  Level of Consciousness: awake, alert  and oriented  Airway & Oxygen Therapy: Patient Spontanous Breathing and Patient connected to face mask oxygen  Post-op Assessment: Report given to RN and Post -op Vital signs reviewed and stable  Post vital signs: Reviewed and stable  Last Vitals:  Vitals Value Taken Time  BP 159/97 05/14/20 0929  Temp    Pulse 73 05/14/20 0932  Resp 23 05/14/20 0932  SpO2 98 % 05/14/20 0932  Vitals shown include unvalidated device data.  Last Pain:  Vitals:   05/14/20 0620  PainSc: 0-No pain      Patients Stated Pain Goal: 4 (16/10/96 0454)  Complications: No complications documented.

## 2020-05-14 NOTE — Anesthesia Procedure Notes (Signed)
Anesthesia Regional Block: TAP block   Pre-Anesthetic Checklist: ,, timeout performed, Correct Patient, Correct Site, Correct Laterality, Correct Procedure, Correct Position, site marked, Risks and benefits discussed, pre-op evaluation,  At surgeon's request and post-op pain management  Laterality: Right  Prep: Maximum Sterile Barrier Precautions used, chloraprep       Needles:  Injection technique: Single-shot  Needle Type: Echogenic Stimulator Needle     Needle Length: 9cm  Needle Gauge: 21     Additional Needles:   Narrative:  Start time: 05/14/2020 9:15 AM End time: 05/14/2020 9:25 AM Injection made incrementally with aspirations every 5 mL. Anesthesiologist: Lidia Collum, MD  Additional Notes: 2% Lidocaine skin wheel.

## 2020-05-14 NOTE — H&P (Signed)
Kristen Snyder is an 54 y.o. female. G1P1001 Patient's last menstrual period was 03/16/2016 (approximate). Patient has a large symptomatic fibroid uterus and is scheduled for TAH/BS. She had signed for no blood products but says she would accept transfusion if indicated.   Pertinent Gynecological History: Menses: post-menopausal   Last mammogram: normal Date: 2021 Last pap: normal Date: 2019    Menstrual History:  Patient's last menstrual period was 03/16/2016 (approximate).    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 at least 4 yr.   Insulin dependent diabetes mellitus    Nonischemic cardiomyopathy ([place])    Personal history of radiation therapy 2016   left   Pneumonia     Past Surgical History:  Procedure Laterality Date   BREAST LUMPECTOMY Left 2017   BREAST LUMPECTOMY WITH NEEDLE LOCALIZATION Left 02/17/2016   Procedure: LEFT BREAST LUMPECTOMY WITH BRACKETED NEEDLE LOCALIZATION;  Surgeon: Excell Seltzer, MD;  Location: [facility];  Service: General;  Laterality: Left;   CARDIAC CATHETERIZATION     EXCISION / BIOPSY BREAST / NIPPLE / DUCT Left 02/17/2016   lumpectomy 2017   KNEE ARTHROSCOPY WITH ANTERIOR CRUCIATE LIGAMENT (ACL) REPAIR Right    LEFT AND RIGHT HEART CATHETERIZATION WITH CORONARY ANGIOGRAM [name] 10/08/2014   Procedure: LEFT AND RIGHT HEART CATHETERIZATION WITH CORONARY ANGIOGRAM;  Surgeon: Jettie Booze, MD;  Location: [facility] CATH LAB;  Service: Cardiovascular;  Laterality: [name];   SHOULDER ARTHROSCOPY W/ ROTATOR CUFF REPAIR Right     Family History  Problem Relation Age of Onset    Diabetes Mother    Hypertension Mother    Cancer Mother 24       colon   Hypertension Father    Diabetes Father    Cancer Father 68       prostate cancer    Heart disease Other        No family history   Cancer Paternal Aunt        breast cancer    Cancer Paternal Aunt 2       ovarian cancer     Social History:  reports that she has never smoked. She has never used smokeless tobacco. She reports that she does not drink alcohol and does not use drugs.  Allergies:  Allergies  Allergen Reactions   Bidil [Isosorb Dinitrate-Hydralazine] Other (See Comments)    DISORIENTATION; DIZZINESS    Medications Prior to Admission  Medication Sig Dispense Refill Last Dose   APPLE CIDER VINEGAR PO Take by mouth. Drinks 1-2 cap fulls once a day   05/13/2020 at Unknown time   carvedilol (COREG) 12.5 MG tablet Take 1.5 tablets (18.75 mg total) by mouth 2 (two) times daily with a meal. 180 tablet 3 05/14/2020 at 0430   LEVEMIR FLEXTOUCH 100 UNIT/ML Pen Inject 80 Units into the skin at bedtime.   0 05/13/2020 at Unknown time   lisinopril (PRINIVIL,ZESTRIL) 40 MG tablet Take 40 mg by mouth daily.  0 05/13/2020 at Unknown time   Multiple Vitamin (MULTIVITAMIN) tablet Take 1 tablet by mouth daily.   05/07/2020   rosuvastatin (CRESTOR)  20 MG tablet Take 20 mg by mouth daily.   05/14/2020 at 0430   spironolactone (ALDACTONE) 25 MG tablet Take 1 tablet (25 mg total) by mouth daily. 90 tablet 3 05/13/2020 at Unknown time   furosemide (LASIX) 80 MG tablet Take 80 mg by mouth daily as needed for fluid. For weight gain    More than a month at Unknown time   tamoxifen (NOLVADEX) 20 MG tablet TAKE 1 TABLET BY MOUTH EVERY DAY (Patient not taking: Reported on 04/01/2020) 90 tablet 1 Not Taking at Unknown time    Review of Systems  Constitutional: Negative.   Respiratory: Negative.   Gastrointestinal: Positive for abdominal distention.  Genitourinary: Positive for pelvic pain.    Blood pressure (!)  146/96, pulse 80, temperature 97.8 F (36.6 C), resp. rate 19, height 5\' 4"  (1.626 m), weight 83.2 kg, last menstrual period 03/16/2016, SpO2 94 %. Physical Exam Constitutional:      Appearance: Normal appearance.  Cardiovascular:     Rate and Rhythm: Normal rate.  Pulmonary:     Effort: Pulmonary effort is normal.  Abdominal:     Palpations: Abdomen is soft. There is mass (lower abdominal fibroid uterus).  Skin:    General: Skin is warm and dry.  Neurological:     General: No focal deficit present.     Mental Status: She is alert.  Psychiatric:        Mood and Affect: Mood normal.        Behavior: Behavior normal.     Results for orders placed or performed during the hospital encounter of 05/14/20 (from the past 24 hour(s))  Glucose, capillary     Status: Abnormal   Collection Time: 05/14/20  6:36 AM  Result Value Ref Range   Glucose-Capillary 138 (H) 70 - 99 mg/dL   Comment 1 Notify RN    CLINICAL DATA:  Postmenopausal bleeding, enlarged uterus  EXAM: TRANSABDOMINAL AND TRANSVAGINAL ULTRASOUND OF PELVIS  TECHNIQUE: Both transabdominal and transvaginal ultrasound examinations of the pelvis were performed. Transabdominal technique was performed for global imaging of the pelvis including uterus, ovaries, adnexal regions, and pelvic cul-de-sac. It was necessary to proceed with endovaginal exam following the transabdominal exam to visualize the endometrium and ovaries.  COMPARISON:  08/01/2018  FINDINGS: Uterus  Measurements: 22.9 x 10.3 x 11.0 cm = volume: 1371 mL. Retroverted. Markedly enlarged and heterogeneous with multiple nodular areas of abnormal heterogeneous echogenicity consistent with multiple leiomyomata. Largest of these measure 8.8 x 8.0 x 9.6 cm at mid uterus and 5.8 x 5.6 x 7.0 cm at upper uterus. Smaller lesions are also present.  Endometrium  Obscured secondary to multiple uterine leiomyomata  Right ovary  Measurements: At 2.7 x 1.5  x 2.3 cm = volume: 4.8 mL. Normal morphology without mass  Left ovary  Measurements: 2.9 x 1.6 x 1.8 cm = volume: 4.4 mL. Normal morphology without mass  Other findings  Trace free pelvic fluid.  No adnexal masses.  IMPRESSION: Markedly enlarged and heterogeneous uterus containing multiple uterine leiomyomata up to 9.6 cm and 7.0 cm in greatest sizes.  Unremarkable ovaries and adnexa.  Endometrial complex is not visualized secondary to distortion of the uterus by multiple large leiomyomata.   Electronically Signed   By: Lavonia Dana M.D.   On: 02/21/2020 16:55  Assessment/Plan:  Large fibroid uterus Recent BTB after cessation of Tamoxifen H/o Diabetes poorly controlled Desires TAH BSO  Plan Patient desires surgical management with TAH BSO.  The risks of surgery  were discussed in detail with the patient including but not limited to: bleeding which may require transfusion or reoperation; infection which may require prolonged hospitalization or re-hospitalization and antibiotic therapy; injury to bowel, bladder, ureters and major vessels or other surrounding organs; need for additional procedures including laparotomy; thromboembolic phenomenon, incisional problems and other postoperative or anesthesia complications.  Patient was told that the likelihood that her condition and symptoms will be treated effectively with this surgical management was very high; the postoperative expectations were also discussed in detail. The patient also understands the alternative treatment options which were discussed in full. All questions were answered.  She will sign consent for blood products, T&S ordered.  Emeterio Reeve 05/14/2020, 7:05 AM

## 2020-05-14 NOTE — Anesthesia Postprocedure Evaluation (Signed)
Anesthesia Post Note  Patient: JASIRA ROBINSON  Procedure(s) Performed: HYSTERECTOMY ABDOMINAL WITH SALPINGO-OOPHORECTOMY (Bilateral Abdomen)     Patient location during evaluation: PACU Anesthesia Type: General and Regional Level of consciousness: awake and alert Pain management: pain level controlled Vital Signs Assessment: post-procedure vital signs reviewed and stable Respiratory status: spontaneous breathing, nonlabored ventilation, respiratory function stable and patient connected to nasal cannula oxygen Cardiovascular status: blood pressure returned to baseline and stable Postop Assessment: no apparent nausea or vomiting Anesthetic complications: no   No complications documented.  Last Vitals:  Vitals:   05/14/20 1212 05/14/20 1435  BP:  (!) 147/91  Pulse:  92  Resp: 13 16  Temp:  36.9 C  SpO2: 100% 100%    Last Pain:  Vitals:   05/14/20 1435  TempSrc: Oral  PainSc:                  Ameya Kutz L Sahalie Beth

## 2020-05-15 ENCOUNTER — Encounter (HOSPITAL_COMMUNITY): Payer: Self-pay | Admitting: Obstetrics & Gynecology

## 2020-05-15 LAB — GLUCOSE, CAPILLARY
Glucose-Capillary: 227 mg/dL — ABNORMAL HIGH (ref 70–99)
Glucose-Capillary: 215 mg/dL — ABNORMAL HIGH (ref 70–99)
Glucose-Capillary: 280 mg/dL — ABNORMAL HIGH (ref 70–99)
Glucose-Capillary: 221 mg/dL — ABNORMAL HIGH (ref 70–99)

## 2020-05-15 LAB — CBC
HCT: 36.5 % (ref 36.0–46.0)
Hemoglobin: 11.8 g/dL — ABNORMAL LOW (ref 12.0–15.0)
MCH: 25.5 pg — ABNORMAL LOW (ref 26.0–34.0)
MCHC: 32.3 g/dL (ref 30.0–36.0)
MCV: 79 fL — ABNORMAL LOW (ref 80.0–100.0)
Platelets: 216 10*3/uL (ref 150–400)
RBC: 4.62 MIL/uL (ref 3.87–5.11)
RDW: 13.8 % (ref 11.5–15.5)
WBC: 9.8 10*3/uL (ref 4.0–10.5)
nRBC: 0 % (ref 0.0–0.2)

## 2020-05-15 LAB — SURGICAL PATHOLOGY

## 2020-05-15 MED ORDER — SODIUM CHLORIDE 0.9% FLUSH
3.0000 mL | Freq: Two times a day (BID) | INTRAVENOUS | Status: DC
  Administered 2020-05-15 (×2): 3 mL via INTRAVENOUS

## 2020-05-15 MED ORDER — LISINOPRIL 40 MG PO TABS
40.0000 mg | ORAL_TABLET | Freq: Every day | ORAL | Status: DC
  Administered 2020-05-15 – 2020-05-16 (×2): 40 mg via ORAL
  Filled 2020-05-15 (×2): qty 1

## 2020-05-15 MED ORDER — SODIUM CHLORIDE 0.9% FLUSH: 3.0000 mL | INTRAVENOUS | Status: DC | PRN

## 2020-05-15 MED ORDER — OXYCODONE-ACETAMINOPHEN 5-325 MG PO TABS
1.0000 | ORAL_TABLET | ORAL | Status: DC | PRN
  Administered 2020-05-15: 1 via ORAL
  Filled 2020-05-15: qty 1

## 2020-05-15 MED ORDER — SODIUM CHLORIDE 0.9 % IV SOLN: 250.0000 mL | INTRAVENOUS | Status: DC | PRN

## 2020-05-15 MED ORDER — CARVEDILOL 6.25 MG PO TABS
18.7500 mg | ORAL_TABLET | Freq: Two times a day (BID) | ORAL | Status: DC
  Administered 2020-05-15 – 2020-05-16 (×2): 18.75 mg via ORAL
  Filled 2020-05-15 (×2): qty 1

## 2020-05-15 MED ORDER — INSULIN DETEMIR 100 UNIT/ML ~~LOC~~ SOLN
80.0000 [IU] | Freq: Every day | SUBCUTANEOUS | Status: DC
  Administered 2020-05-15: 80 [IU] via SUBCUTANEOUS
  Filled 2020-05-15 (×2): qty 0.8

## 2020-05-15 NOTE — Progress Notes (Signed)
1 Day Post-Op Procedure(s) (LRB): HYSTERECTOMY ABDOMINAL WITH SALPINGO-OOPHORECTOMY (Bilateral)  Subjective: Patient reports incisional pain and tolerating PO.    Objective: I have reviewed patient's vital signs and labs. Blood pressure 129/85, pulse 92, temperature 98.4 F (36.9 C), temperature source Oral, resp. rate 16, height 5\' 4"  (1.626 m), weight 83.2 kg, last menstrual period 03/16/2016, SpO2 97 %.  General: alert, cooperative and no distress Resp: nl effort GI: soft, non-tender; bowel sounds normal; no masses,  no organomegaly and incision: clean, dry and intact Extremities: extremities normal, atraumatic, no cyanosis or edema CBC    Component Value Date/Time   WBC 9.8 05/15/2020 0336   RBC 4.62 05/15/2020 0336   HGB 11.8 (L) 05/15/2020 0336   HGB 14.2 12/15/2016 0814   HCT 36.5 05/15/2020 0336   HCT 42.2 12/15/2016 0814   PLT 216 05/15/2020 0336   PLT 219 12/15/2016 0814   MCV 79.0 (L) 05/15/2020 0336   MCV 79.4 (L) 12/15/2016 0814   MCH 25.5 (L) 05/15/2020 0336   MCHC 32.3 05/15/2020 0336   RDW 13.8 05/15/2020 0336   RDW 14.2 12/15/2016 0814   LYMPHSABS 1.4 12/15/2019 0802   LYMPHSABS 1.1 12/15/2016 0814   MONOABS 0.4 12/15/2019 0802   MONOABS 0.3 12/15/2016 0814   EOSABS 0.1 12/15/2019 0802   EOSABS 0.1 12/15/2016 0814   BASOSABS 0.0 12/15/2019 0802   BASOSABS 0.0 12/15/2016 0814   CBG (last 3)  Recent Labs    05/14/20 1647 05/14/20 2103 05/15/20 0741  GLUCAP 245* 324* 227*    Assessment: s/p Procedure(s): HYSTERECTOMY ABDOMINAL WITH SALPINGO-OOPHORECTOMY (Bilateral): stable and progressing well  Plan: Encourage ambulation Advance to PO medication Discontinue IV fluids  LOS: 1 day    Emeterio Reeve 05/15/2020, 8:05 AM

## 2020-05-15 NOTE — Progress Notes (Addendum)
Inpatient Diabetes Program Recommendations  AACE/ADA: New Consensus Statement on Inpatient Glycemic Control (2015)  Target Ranges:  Prepandial:   less than 140 mg/dL      Peak postprandial:   less than 180 mg/dL (1-2 hours)      Critically ill patients:  140 - 180 mg/dL   Lab Results  Component Value Date   GLUCAP 227 (H) 05/15/2020   HGBA1C 11.9 (H) 05/07/2020    Review of Glycemic Control Results for Kristen Snyder, Kristen Snyder (MRN [id number]) as of 05/15/2020 11:32  Ref. Range 05/14/2020 09:30 05/14/2020 12:15 05/14/2020 16:47 05/14/2020 21:03 05/15/2020 07:41  Glucose-Capillary Latest Ref Range: 70 - 99 mg/dL 175 (H) 176 (H) 245 (H) 324 (H) 227 (H)    Diabetes history:  DM2 Outpatient Diabetes medications:  Levemir 80 units qhs Current orders for Inpatient glycemic control:  Novolog 0-15 units tid  Inpatient Diabetes Program Recommendations:     MD please consider adding Levemir 65 units qhs (80% of home dose) Carb modified diet  Had last dose of Levemir night prior to surgery (1/2 dose)  Note: Spoke with patient at bedside.  Reviewed patient's current A1c of 11.7% (average blood sugar 289 mg/dl). Explained what a A1c is and what it measures. Also reviewed goal A1c with patient, importance of good glucose control @ home, and blood sugar goals.  She admits to drinking 1-2 sodas on the weekend.  We reviewed CHO's and goals per meal.  She admits there are many areas she can improve on in regards to diet.  We discussed exercise and how 15-20 walks 4-5 x week can decrease A1C.  She is current with her PCP who manages her DM.   She verified above home medication of Levemir 80 units qhs.  She checks her CBG once a day.  She recently stopped taking Tamoxifen in January after being on it for 3+ years for breast cancer treatment.  Reviewed hypoglycemia, symptoms and treatments.    Will continue to follow while inpatient.  Thank you, Reche Dixon, RN, BSN Diabetes Coordinator Inpatient Diabetes  Program [phone] (team pager from 8a-5p)

## 2020-05-15 NOTE — Plan of Care (Signed)
  Problem: Education: Goal: Knowledge of General Education information will improve Description: Including pain rating scale, medication(s)/side effects and non-pharmacologic comfort measures Outcome: Progressing   Problem: Health Behavior/Discharge Planning: Goal: Ability to manage health-related needs will improve Outcome: Progressing   Problem: Clinical Measurements: Goal: Ability to maintain clinical measurements within normal limits will improve Outcome: Progressing Goal: Will remain free from infection Outcome: Progressing Goal: Diagnostic test results will improve Outcome: Progressing Goal: Respiratory complications will improve Outcome: Progressing   Problem: Activity: Goal: Risk for activity intolerance will decrease Outcome: Progressing   Problem: Nutrition: Goal: Adequate nutrition will be maintained Outcome: Progressing   Problem: Coping: Goal: Level of anxiety will decrease Outcome: Progressing   Problem: Pain Managment: Goal: General experience of comfort will improve Outcome: Progressing   Problem: Safety: Goal: Ability to remain free from injury will improve Outcome: Progressing

## 2020-05-16 ENCOUNTER — Telehealth: Payer: Self-pay

## 2020-05-16 LAB — GLUCOSE, CAPILLARY: Glucose-Capillary: 66 mg/dL — ABNORMAL LOW (ref 70–99)

## 2020-05-16 MED ORDER — OXYCODONE-ACETAMINOPHEN 5-325 MG PO TABS: 1.0000 | ORAL_TABLET | ORAL | 0 refills | Status: DC | PRN

## 2020-05-16 MED ORDER — IBUPROFEN 800 MG PO TABS: 800.0000 mg | ORAL_TABLET | Freq: Four times a day (QID) | ORAL | 0 refills | Status: AC

## 2020-05-16 NOTE — Telephone Encounter (Signed)
A representative from [facility] called stated that the pt requested to have medical equipment and she needs to have a prescription for it.  Their office is closed Friday and Monday for the holiday.  She can be reached at [phone] ext [phone].    Mel Almond, RN

## 2020-05-16 NOTE — Discharge Instructions (Signed)
Abdominal Hysterectomy, Care After This sheet gives you information about how to care for yourself after your procedure. Your doctor may also give you more specific instructions. If you have problems or questions, contact your doctor. Follow these instructions at home: Bathing  Do not take baths, swim, or use a hot tub until your doctor says it is okay. Ask your doctor if you can take showers. You may only be allowed to take sponge baths for bathing.  Keep the bandage (dressing) dry until your doctor says it can be taken off. Surgical cut (incision) care      Follow instructions from your doctor about how to take care of your cut from surgery. Make sure you: ? Wash your hands with soap and water before you change your bandage (dressing). If you cannot use soap and water, use hand sanitizer. ? Change your bandage as told by your doctor. ? Leave stitches (sutures), skin glue, or skin tape (adhesive) strips in place. They may need to stay in place for 2 weeks or longer. If tape strips get loose and curl up, you may trim the loose edges. Do not remove tape strips completely unless your doctor says it is okay.  Check your surgical cut area every day for signs of infection. Check for: ? Redness, swelling, or pain. ? Fluid or blood. ? Warmth. ? Pus or a bad smell. Activity  Do gentle, daily exercise as told by your doctor. You may be told to take short walks every day and go farther each time.  Do not lift anything that is heavier than 10 lb (4.5 kg), or the limit that your doctor tells you, until he or she says that it is safe.  Do not drive or use heavy machinery while taking prescription pain medicine.  Do not drive for 24 hours if you were given a medicine to help you relax (sedative).  Follow your doctor's advice about exercise, driving, and general activities. Ask your doctor what activities are safe for you. Lifestyle   Do not douche, use tampons, or have sex for at least 6 weeks  or as told by your doctor.  Do not drink alcohol until your doctor says it is okay.  Drink enough fluid to keep your pee (urine) clear or pale yellow.  Try to have someone at home with you for the first 1-2 weeks to help.  Do not use any products that contain nicotine or tobacco, such as cigarettes and e-cigarettes. These can slow down healing. If you need help quitting, ask your doctor. General instructions  Take over-the-counter and prescription medicines only as told by your doctor.  Do not take aspirin or ibuprofen. These medicines can cause bleeding.  To prevent or treat constipation while you are taking prescription pain medicine, your doctor may suggest that you: ? Drink enough fluid to keep your urine clear or pale yellow. ? Take over-the-counter or prescription medicines. ? Eat foods that are high in fiber, such as:  Fresh fruits and vegetables.  Whole grains.  Beans. ? Limit foods that are high in fat and processed sugars, such as fried and sweet foods.  Keep all follow-up visits as told by your doctor. This is important. Contact a doctor if:  You have chills or fever.  You have redness, swelling, or pain around your cut.  You have fluid or blood coming from your cut.  Your cut feels warm to the touch.  You have pus or a bad smell coming from your cut.    Your cut breaks open.  You feel dizzy or light-headed.  You have pain or bleeding when you pee.  You keep having watery poop (diarrhea).  You keep feeling sick to your stomach (nauseous) or keep throwing up (vomiting).  You have unusual fluid (discharge) coming from your vagina.  You have a rash.  You have a reaction to your medicine.  Your pain medicine does not help. Get help right away if:  You have a fever and your symptoms get worse all of a sudden.  You have very bad belly (abdominal) pain.  You are short of breath.  You pass out (faint).  You have pain, swelling, or redness of your  leg.  You bleed a lot from your vagina and notice clumps of blood (clots). Summary  Do not take baths, swim, or use a hot tub until your doctor says it is okay. Ask your doctor if you can take showers. You may only be allowed to take sponge baths for bathing.  Follow your doctor's advice about exercise, driving, and general activities. Ask your doctor what activities are safe for you.  Do not lift anything that is heavier than 10 lb (4.5 kg), or the limit that your doctor tells you, until he or she says that it is safe.  Try to have someone at home with you for the first 1-2 weeks to help. This information is not intended to replace advice given to you by your health care provider. Make sure you discuss any questions you have with your health care provider. Document Revised: 01/05/2019 Document Reviewed: 10/21/2016 Elsevier Patient Education  2020 [employer]vier Inc[employer]  

## 2020-05-16 NOTE — Discharge Summary (Signed)
Gynecology Physician Postoperative Discharge Summary  Patient ID: Kristen Snyder MRN: [id number] DOB/AGE: 55-Jul-1967 55 y.o.  Admit Date: 05/14/2020 Discharge Date: 05/16/2020  Preoperative Diagnoses: Postmenopausal bleeding, fibroid uterus  Procedures: Procedure(s) (LRB): HYSTERECTOMY ABDOMINAL WITH SALPINGO-OOPHORECTOMY (Bilateral)  Hospital Course:  Kristen Snyder is a 54 y.o. G1P1001  admitted for scheduled surgery.  She underwent the procedures as mentioned above, her operation was uncomplicated. For further details about surgery, please refer to the operative report. Patient had an uncomplicated postoperative course. By time of discharge on POD#2, her pain was controlled on oral pain medications; she was ambulating, voiding without difficulty, tolerating regular diet and passing flatus. She was deemed stable for discharge to home.   Significant Labs: CBC Latest Ref Rng & Units 05/15/2020 05/07/2020 12/15/2019  WBC 4.0 - 10.5 K/uL 9.8 3.9(L) 4.1  Hemoglobin 12.0 - 15.0 g/dL 11.8(L) 15.0 14.0  Hematocrit 36 - 46 % 36.5 46.4(H) 42.7  Platelets 150 - 400 K/uL 216 251 239    Discharge Exam: Blood pressure 137/82, pulse 73, temperature 98.4 F (36.9 C), temperature source Oral, resp. rate 16, height 5\' 4"  (1.626 m), weight 83.2 kg, last menstrual period 03/16/2016, SpO2 98 %. General appearance: alert and no distress  Resp: clear to auscultation bilaterally  Cardio: regular rate and rhythm  GI: soft, non-tender; bowel sounds normal; no masses, no organomegaly.  Incision: C/D/I, no erythema, no drainage noted Pelvic: scant blood on pad  Extremities: extremities normal, atraumatic, no cyanosis or edema and Homans sign is negative, no sign of DVT  Discharged Condition: Stable  Disposition: Discharge disposition: 01-Home or Self Care       Discharge Instructions    Discharge patient   Complete by: As directed    Discharge disposition: 01-Home or Self Care   Discharge  patient date: 05/16/2020     Allergies as of 05/16/2020      Reactions   Bidil [isosorb Dinitrate-hydralazine] Other (See Comments)   DISORIENTATION; DIZZINESS      Medication List    STOP taking these medications   tamoxifen 20 MG tablet Commonly known as: NOLVADEX     TAKE these medications   APPLE CIDER VINEGAR PO Take by mouth. Drinks 1-2 cap fulls once a day   carvedilol 12.5 MG tablet Commonly known as: COREG Take 1.5 tablets (18.75 mg total) by mouth 2 (two) times daily with a meal.   furosemide 80 MG tablet Commonly known as: LASIX Take 80 mg by mouth daily as needed for fluid. For weight gain   ibuprofen 800 MG tablet Commonly known as: ADVIL Take 1 tablet (800 mg total) by mouth every 6 (six) hours.   Levemir FlexTouch 100 UNIT/ML FlexPen Generic drug: insulin detemir Inject 80 Units into the skin at bedtime.   lisinopril 40 MG tablet Commonly known as: ZESTRIL Take 40 mg by mouth daily.   multivitamin tablet Take 1 tablet by mouth daily.   oxyCODONE-acetaminophen 5-325 MG tablet Commonly known as: PERCOCET/ROXICET Take 1-2 tablets by mouth every 4 (four) hours as needed for moderate pain or severe pain.   rosuvastatin 20 MG tablet Commonly known as: CRESTOR Take 20 mg by mouth daily.   spironolactone 25 MG tablet Commonly known as: ALDACTONE Take 1 tablet (25 mg total) by mouth daily.       Follow-up Information    [facility] Follow up in 4 week(s).   Specialty: Obstetrics and Gynecology Contact information: [address], [place]  [place] [phone]              Signed: Woodroe Mode, MD, [place], [place]

## 2020-05-21 ENCOUNTER — Telehealth: Payer: Self-pay

## 2020-05-21 NOTE — Telephone Encounter (Signed)
Returned call, no answer, left vm 

## 2020-06-12 ENCOUNTER — Ambulatory Visit (INDEPENDENT_AMBULATORY_CARE_PROVIDER_SITE_OTHER): Payer: BC Managed Care – PPO | Admitting: Obstetrics & Gynecology

## 2020-06-12 ENCOUNTER — Encounter: Payer: Self-pay | Admitting: Obstetrics & Gynecology

## 2020-06-12 ENCOUNTER — Other Ambulatory Visit: Payer: Self-pay

## 2020-06-12 VITALS — BP 128/84 | HR 85 | Wt 179.0 lb

## 2020-06-12 DIAGNOSIS — D259 Leiomyoma of uterus, unspecified: Secondary | ICD-10-CM

## 2020-06-12 DIAGNOSIS — Z9889 Other specified postprocedural states: Secondary | ICD-10-CM

## 2020-06-12 NOTE — Progress Notes (Signed)
RGYN patient presents for Post-Op visit today.   05/14/20 : Total abdominal hysterectomy, Bilateral Salpingoohorectomy.    Pt states she is doing well. Pt denies any bleeding. Pt may have days where she has pain but notes pain is bearable and will take prescribed Rx to help.   Pt wants discuss return to work dates and allowing herself time to heal before going back to work.

## 2020-06-12 NOTE — Patient Instructions (Signed)
Abdominal Hysterectomy, Care After This sheet gives you information about how to care for yourself after your procedure. Your doctor may also give you more specific instructions. If you have problems or questions, contact your doctor. Follow these instructions at home: Bathing  Do not take baths, swim, or use a hot tub until your doctor says it is okay. Ask your doctor if you can take showers. You may only be allowed to take sponge baths for bathing.  Keep the bandage (dressing) dry until your doctor says it can be taken off. Surgical cut (incision) care      Follow instructions from your doctor about how to take care of your cut from surgery. Make sure you: ? Wash your hands with soap and water before you change your bandage (dressing). If you cannot use soap and water, use hand sanitizer. ? Change your bandage as told by your doctor. ? Leave stitches (sutures), skin glue, or skin tape (adhesive) strips in place. They may need to stay in place for 2 weeks or longer. If tape strips get loose and curl up, you may trim the loose edges. Do not remove tape strips completely unless your doctor says it is okay.  Check your surgical cut area every day for signs of infection. Check for: ? Redness, swelling, or pain. ? Fluid or blood. ? Warmth. ? Pus or a bad smell. Activity  Do gentle, daily exercise as told by your doctor. You may be told to take short walks every day and go farther each time.  Do not lift anything that is heavier than 10 lb (4.5 kg), or the limit that your doctor tells you, until he or she says that it is safe.  Do not drive or use heavy machinery while taking prescription pain medicine.  Do not drive for 24 hours if you were given a medicine to help you relax (sedative).  Follow your doctor's advice about exercise, driving, and general activities. Ask your doctor what activities are safe for you. Lifestyle   Do not douche, use tampons, or have sex for at least 6 weeks  or as told by your doctor.  Do not drink alcohol until your doctor says it is okay.  Drink enough fluid to keep your pee (urine) clear or pale yellow.  Try to have someone at home with you for the first 1-2 weeks to help.  Do not use any products that contain nicotine or tobacco, such as cigarettes and e-cigarettes. These can slow down healing. If you need help quitting, ask your doctor. General instructions  Take over-the-counter and prescription medicines only as told by your doctor.  Do not take aspirin or ibuprofen. These medicines can cause bleeding.  To prevent or treat constipation while you are taking prescription pain medicine, your doctor may suggest that you: ? Drink enough fluid to keep your urine clear or pale yellow. ? Take over-the-counter or prescription medicines. ? Eat foods that are high in fiber, such as:  Fresh fruits and vegetables.  Whole grains.  Beans. ? Limit foods that are high in fat and processed sugars, such as fried and sweet foods.  Keep all follow-up visits as told by your doctor. This is important. Contact a doctor if:  You have chills or fever.  You have redness, swelling, or pain around your cut.  You have fluid or blood coming from your cut.  Your cut feels warm to the touch.  You have pus or a bad smell coming from your cut.    Your cut breaks open.  You feel dizzy or light-headed.  You have pain or bleeding when you pee.  You keep having watery poop (diarrhea).  You keep feeling sick to your stomach (nauseous) or keep throwing up (vomiting).  You have unusual fluid (discharge) coming from your vagina.  You have a rash.  You have a reaction to your medicine.  Your pain medicine does not help. Get help right away if:  You have a fever and your symptoms get worse all of a sudden.  You have very bad belly (abdominal) pain.  You are short of breath.  You pass out (faint).  You have pain, swelling, or redness of your  leg.  You bleed a lot from your vagina and notice clumps of blood (clots). Summary  Do not take baths, swim, or use a hot tub until your doctor says it is okay. Ask your doctor if you can take showers. You may only be allowed to take sponge baths for bathing.  Follow your doctor's advice about exercise, driving, and general activities. Ask your doctor what activities are safe for you.  Do not lift anything that is heavier than 10 lb (4.5 kg), or the limit that your doctor tells you, until he or she says that it is safe.  Try to have someone at home with you for the first 1-2 weeks to help. This information is not intended to replace advice given to you by your health care provider. Make sure you discuss any questions you have with your health care provider. Document Revised: 01/05/2019 Document Reviewed: 10/21/2016 Elsevier Patient Education  2020 [employer]  

## 2020-06-12 NOTE — Progress Notes (Signed)
Subjective:     THELIA TANKSLEY is a 54 y.o. female who presents to the clinic 4 weeks status post BSO/TAH normal. The patient is not having any pain.  The following portions of the patient's history were reviewed and updated as appropriate: allergies, current medications, past family history, past medical history, past social history, past surgical history and problem list.  Review of Systems Pertinent items are noted in HPI.    Objective:    BP 128/84   Pulse 85   Wt 179 lb (81.2 kg)   LMP 03/16/2016 (Approximate)   BMI 30.73 kg/m  General:  alert, cooperative and no distress  Abdomen: soft, bowel sounds active, non-tender  Incision:   healing well, no drainage, no erythema, no hernia, no seroma, no swelling, no dehiscence, incision well approximated     Assessment:    Doing well postoperatively. Operative findings again reviewed. Pathology report discussed.    Plan:    1. Continue any current medications. 2. Wound care discussed. 3. Activity restrictions: no lifting more than 25 pounds 4. Anticipated return to work: 4 weeks. 5. Follow up:prn  l. Patient ID: [name], female   DOB: 07-26-1966, 54 y.o.   MRN: [id number]

## 2020-06-27 ENCOUNTER — Telehealth: Payer: Self-pay | Admitting: Cardiology

## 2020-06-27 NOTE — Telephone Encounter (Signed)
LVM for patient to return call to get follow up scheduled with [facility] from recall list

## 2020-08-05 NOTE — Progress Notes (Signed)
Virtual Visit via Video Note   This visit type was conducted due to national recommendations for restrictions regarding the COVID-19 Pandemic (e.g. social distancing) in an effort to limit this patient's exposure and mitigate transmission in our community.  Due to her co-morbid illnesses, this patient is at least at moderate risk for complications without adequate follow up.  This format is felt to be most appropriate for this patient at this time.  All issues noted in this document were discussed and addressed.  A limited physical exam was performed with this format.  Please refer to the patient's chart for her consent to telehealth for [facility].      Date:  08/15/2020   ID:  Kristen Snyder, DOB 1966-01-15, MRN [id number]  Patient Location:Home Provider Location: Home  PCP:  Lin Landsman, MD  Cardiologist:  Dr Stanford Breed  Evaluation Performed:  Follow-Up Visit  Chief Complaint:  FU CM  History of Present Illness:    FU cardiomyopathy/CHF. Echo 11/15 showed EF 10-15, restrictive filling, mild LAE, mild MR, moderately elevated pulmonary pressure. TSH normal. Cath 11/15 showed normal cors, EF 15, LVEDP 25. Echocardiogram repeated June 2016 and showed ejection fraction 50-55% and grade 1 diastolic dysfunction. Since she was last seen, she is recovering from Covid.  She denies increased dyspnea, chest pain, palpitations or syncope.    The patient does not have symptoms concerning for COVID-19 infection (fever, chills, cough, or new shortness of breath).    Past Medical History:  Diagnosis Date  . Anxiety   . 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 Breast cancer ([place]) 2017   left  . CHF (congestive heart failure) ([place])   . Dental crown present   . 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 History of congestive heart failure 10/2014  . Hypertension    states under control with meds., has been on med. at least 16 yr.  . Insulin dependent diabetes  mellitus   . Nonischemic cardiomyopathy ([place])   . Personal history of radiation therapy 2016   left  . Pneumonia    Past Surgical History:  Procedure Laterality Date  . ABDOMINAL HYSTERECTOMY  05/14/2020  . BREAST LUMPECTOMY Left 2017  . BREAST LUMPECTOMY WITH NEEDLE LOCALIZATION Left 02/17/2016   Procedure: LEFT BREAST LUMPECTOMY WITH BRACKETED NEEDLE LOCALIZATION;  Surgeon: Excell Seltzer, MD;  Location: [place];  Service: General;  Laterality: Left;  . CARDIAC CATHETERIZATION    . EXCISION / BIOPSY BREAST / NIPPLE / DUCT Left 02/17/2016   lumpectomy 2017  . HYSTERECTOMY ABDOMINAL WITH SALPINGO-OOPHORECTOMY Bilateral 05/14/2020   Procedure: HYSTERECTOMY ABDOMINAL WITH SALPINGO-OOPHORECTOMY;  Surgeon: Woodroe Mode, MD;  Location: [place];  Service: Gynecology;  Laterality: Bilateral;  . KNEE ARTHROSCOPY WITH ANTERIOR CRUCIATE LIGAMENT (ACL) REPAIR Right   . LEFT AND RIGHT HEART CATHETERIZATION WITH CORONARY ANGIOGRAM [name] 10/08/2014   Procedure: LEFT AND RIGHT HEART CATHETERIZATION WITH CORONARY ANGIOGRAM;  Surgeon: Jettie Booze, MD;  Location: [facility] CATH LAB;  Service: Cardiovascular;  Laterality: [name];  . SHOULDER ARTHROSCOPY W/ ROTATOR CUFF REPAIR Right      Current Meds  Medication Sig  . APPLE CIDER VINEGAR PO Take by mouth. Drinks 1-2 cap fulls once a day  . carvedilol (COREG) 12.5 MG tablet Take 1.5 tablets (18.75 mg total) by mouth 2 (two) times daily with a meal.  . furosemide (LASIX) 80 MG tablet Take 80 mg by mouth daily as needed for fluid. For weight  gain   . ibuprofen (ADVIL) 800 MG tablet Take 1 tablet (800 mg total) by mouth every 6 (six) hours.  Marland Kitchen LEVEMIR FLEXTOUCH 100 UNIT/ML Pen Inject 80 Units into the skin at bedtime.   Marland Kitchen lisinopril (PRINIVIL,ZESTRIL) 40 MG tablet Take 40 mg by mouth daily.  . Multiple Vitamin (MULTIVITAMIN) tablet Take 1 tablet by mouth daily.  Marland Kitchen oxyCODONE-acetaminophen (PERCOCET/ROXICET) 5-325 MG tablet Take 1-2 tablets by  mouth every 4 (four) hours as needed for moderate pain or severe pain.  . rosuvastatin (CRESTOR) 20 MG tablet Take 20 mg by mouth daily.  Marland Kitchen spironolactone (ALDACTONE) 25 MG tablet Take 1 tablet (25 mg total) by mouth daily.     Allergies:   Bidil [isosorb dinitrate-hydralazine]   Social History   Tobacco Use  . Smoking status: Never Smoker  . Smokeless tobacco: Never Used  Vaping Use  . Vaping Use: Never used  Substance Use Topics  . Alcohol use: No    Alcohol/week: 1.0 standard drink    Types: 1 Glasses of wine per week    Comment: occasionally  . Drug use: No     Family Hx: The patient's family history includes Cancer in her paternal aunt; Cancer (age of onset: 81) in her paternal aunt; Cancer (age of onset: 61) in her mother; Cancer (age of onset: 32) in her father; Diabetes in her father and mother; Heart disease in an other family member; Hypertension in her father and mother.  ROS:   Please see the history of present illness.    No Fever, chills  or productive cough All other systems reviewed and are negative.   Recent Labs: 12/15/2019: ALT 20 05/07/2020: BUN 10; Creatinine, Ser 0.56; Potassium 4.3; Sodium 136 05/15/2020: Hemoglobin 11.8; Platelets 216   Recent Lipid Panel Lab Results  Component Value Date/Time   CHOL 161 10/05/2014 03:45 PM   TRIG 98 10/05/2014 03:45 PM   HDL 63 10/05/2014 03:45 PM   CHOLHDL 2.6 10/05/2014 03:45 PM   LDLCALC 78 10/05/2014 03:45 PM    Wt Readings from Last 3 Encounters:  06/12/20 179 lb (81.2 kg)  05/14/20 183 lb 8 oz (83.2 kg)  05/07/20 183 lb 8 oz (83.2 kg)     Objective:    Vital Signs:  BP 117/78   Pulse 98   Ht 5\' 5"  (1.651 m)   LMP 03/16/2016 (Approximate)   BMI 29.79 kg/m    VITAL SIGNS:  reviewed NAD Answers questions appropriately Normal affect Remainder of physical examination not performed (telehealth visit; coronavirus pandemic)  ASSESSMENT & PLAN:    1. Nonischemic cardiomyopathy-improved on most  recent echocardiogram.  Continue ACE inhibitor and beta-blocker.  Repeat echocardiogram. 2. Chronic combined systolic/diastolic congestive heart failure-patient is euvolemic based on history.  Continue diuretics at present dose.  Check potassium and renal function. 3. Hypertension-blood pressure is controlled.  Continue present medical regimen. 4. Hyperlipidemia-continue statin.  Check lipids and liver.  COVID-19 Education: The importance of social distancing was discussed today.  Time:   Today, I have spent 15 minutes with the patient with telehealth technology discussing the above problems.     Medication Adjustments/Labs and Tests Ordered: Current medicines are reviewed at length with the patient today.  Concerns regarding medicines are outlined above.   Tests Ordered: No orders of the defined types were placed in this encounter.   Medication Changes: No orders of the defined types were placed in this encounter.   Follow Up:  In Person in 1 year(s)  Signed,  Kirk Ruths, MD  08/15/2020 8:16 AM    [place]

## 2020-08-15 ENCOUNTER — Telehealth (INDEPENDENT_AMBULATORY_CARE_PROVIDER_SITE_OTHER): Payer: BC Managed Care – PPO | Admitting: Cardiology

## 2020-08-15 ENCOUNTER — Encounter: Payer: Self-pay | Admitting: Cardiology

## 2020-08-15 VITALS — BP 117/78 | HR 98 | Ht 65.0 in

## 2020-08-15 DIAGNOSIS — I428 Other cardiomyopathies: Secondary | ICD-10-CM

## 2020-08-15 DIAGNOSIS — E7800 Pure hypercholesterolemia, unspecified: Secondary | ICD-10-CM | POA: Diagnosis not present

## 2020-08-15 DIAGNOSIS — I10 Essential (primary) hypertension: Secondary | ICD-10-CM | POA: Diagnosis not present

## 2020-08-15 NOTE — Patient Instructions (Signed)
Lab Work:  Your physician recommends that you return for lab work FASTING  If you have labs (blood work) drawn today and your tests are completely normal, you will receive your results only by: Marland Kitchen MyChart Message (if you have MyChart) OR . A paper copy in the mail If you have any lab test that is abnormal or we need to change your treatment, we will call you to review the results.   Testing/Procedures:  Your physician has requested that you have an echocardiogram. Echocardiography is a painless test that uses sound waves to create images of your heart. It provides your doctor with information about the size and shape of your heart and how well your heart's chambers and valves are working. This procedure takes approximately one hour. There are no restrictions for this procedure.[place]     Follow-Up: At [facility], you and your health needs are our priority.  As part of our continuing mission to provide you with exceptional heart care, we have created designated Provider Care Teams.  These Care Teams include your primary Cardiologist (physician) and Advanced Practice Providers (APPs -  Physician Assistants and Nurse Practitioners) who all work together to provide you with the care you need, when you need it.  We recommend signing up for the patient portal called "MyChart".  Sign up information is provided on this After Visit Summary.  MyChart is used to connect with patients for Virtual Visits (Telemedicine).  Patients are able to view lab/test results, encounter notes, upcoming appointments, etc.  Non-urgent messages can be sent to your provider as well.   To learn more about what you can do with MyChart, go to [email or web address].    Your next appointment:   12 month(s)  The format for your next appointment:   In Person  Provider:   You may see Kirk Ruths, MD or one of the following Advanced Practice Providers on your designated Care Team:    Kerin Ransom,  PA-C  [place], [place]  Coletta Memos, [place]

## 2020-08-27 DIAGNOSIS — E119 Type 2 diabetes mellitus without complications: Secondary | ICD-10-CM | POA: Diagnosis not present

## 2020-08-27 DIAGNOSIS — E782 Mixed hyperlipidemia: Secondary | ICD-10-CM | POA: Diagnosis not present

## 2020-08-27 DIAGNOSIS — I10 Essential (primary) hypertension: Secondary | ICD-10-CM | POA: Diagnosis not present

## 2020-09-06 ENCOUNTER — Ambulatory Visit (HOSPITAL_COMMUNITY): Payer: BC Managed Care – PPO | Attending: Cardiology

## 2020-09-06 ENCOUNTER — Other Ambulatory Visit: Payer: Self-pay

## 2020-09-06 DIAGNOSIS — I428 Other cardiomyopathies: Secondary | ICD-10-CM | POA: Insufficient documentation

## 2020-09-06 LAB — ECHOCARDIOGRAM COMPLETE
Area-P 1/2: 4.15 cm2
S' Lateral: 3.4 cm

## 2020-09-09 ENCOUNTER — Encounter: Payer: Self-pay | Admitting: *Deleted

## 2020-09-11 ENCOUNTER — Encounter: Payer: Self-pay | Admitting: *Deleted

## 2020-09-20 ENCOUNTER — Telehealth: Payer: Self-pay | Admitting: Cardiology

## 2020-09-20 NOTE — Telephone Encounter (Signed)
Spoke with patient. No results in mailbox currently but primary nurse or physician could have them for review. Will send message to primary RN. Informed patient nurse and MD are out of the office today, understanding verbalized. No further questions at this time.

## 2020-09-20 NOTE — Telephone Encounter (Signed)
Patient states she had lab work drawn at [facility] (Dr. Lin Landsman) on 09/05/20. She states their office faxed the results to [place] and she would like to know if we received them. Please advise.

## 2020-09-23 NOTE — Telephone Encounter (Signed)
Left message for dr Ayesha Rumpf office to please fax lab results.

## 2020-09-25 NOTE — Telephone Encounter (Signed)
Left message for pt,  labs reviewed by dr Stanford Breed and no changes at this time.

## 2020-12-02 ENCOUNTER — Other Ambulatory Visit: Payer: Self-pay | Admitting: Cardiology

## 2020-12-06 ENCOUNTER — Other Ambulatory Visit: Payer: Self-pay | Admitting: Hematology

## 2020-12-06 DIAGNOSIS — Z853 Personal history of malignant neoplasm of breast: Secondary | ICD-10-CM

## 2020-12-11 NOTE — Progress Notes (Signed)
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  SUMMARY OF ONCOLOGIC HISTORY: Oncology History Overview Note  Breast cancer of lower-outer quadrant of left female breast [facility])   Staging form: Breast, AJCC 7th Edition     Clinical: Stage 0 (Tis (DCIS), N0, M0) - Unsigned     Pathologic stage from 02/17/2016: Stage 0 (Tis (DCIS), N0, cM0) - Signed by Truitt Merle, MD on 03/08/2016     Breast cancer of lower-outer quadrant of left female breast ([place])  09/12/2015 Mammogram   Screening mammogram showed calcification in left breast    10/09/2015 Mammogram   diagnostoc mammo and US showed 3.0cm  loosely grouped calcification in the lower inner left breast are suspicious    10/23/2015 Initial Diagnosis   Breast cancer of lower-outer quadrant of left female breast ([place])   10/23/2015 Initial Biopsy   left breast biopsy showed DCIS and comedonecrosis    10/23/2015 Receptors her2   ER 100%+, PR 5%+   11/13/2015 Imaging   Breast MRI showed known masslike enhancement in the left breast in 2 locations, measuring 4.3 x 1.6 x 1.4 cm, and 2.6 x 1.0 x 1.0 cm. Normal appearance of the right breast. No evidence of adenopathy.   01/24/2016 Imaging   MR breast b/l w wo contrast 01/24/2016 IMPRESSION: 1. Significantly less non mass enhancement in the 2 areas of the left breast previously identified. There is residual stippled non mass enhancement between the tissue marker clips in the lower inner quadrant and slight lower outer quadrant spanning approximately 3.8 cm (between the 2 coil shaped tissue marker clips, distance between the clips on the MRI approximating 2.6 cm, approximately 5 cm apart on the CC mammogram with compression). This corresponds to  the biopsy-proven foci of DCIS and ADH. 2. Residual non mass enhancement in the lower inner quadrant, posterior depth (biopsy-proven ALH) spanning approximately 2.4 cm. 3. No new abnormalities involving either breast. 4. No pathologic lymphadenopathy.   02/17/2016 Surgery   left breast lumpectomy   02/17/2016 Pathology Results   left breast lumpectomy showed low grade DCIS, less than 0.1cm, (+) ALH, DCIS focally 0.1cm to the inferior margin   03/23/2016 - 05/07/2016 Radiation Therapy   Left breast radiation   05/30/2016 - 12/15/2019 Anti-estrogen oral therapy   Tamoxifen $RemoveBe'20mg'[id number]$  daily started on 05/30/2016.Stopped 12/15/19 due to poorly controlled DM.    01/04/2017 Mammogram   IMPRESSION: No evidence of malignancy in either breast.   01/05/2018 Mammogram   01/05/2018 Mammogram IMPRESSION: No evidence of malignancy within either breast. Stable postsurgical changes within the left breast.      CURRENT THERAPY:  Surveillance   INTERVAL HISTORY:  Kristen Snyder is here for a follow up of left breast DCIS. She was last seen by me 1 year ago. She presents to the clinic alone. Her menstrual period came back after she stopped tamoxifen a year ago.  Due to the large fibroid, had hysterectomy in September 2021.  She has recovered well from surgery.  She is still struggling with her diabetes control, she has been seeing her primary care physician Dr. Ayesha Rumpf every 3 months she is on long-acting insulin only.  She overall feels well, denies any, or other  All other systems were reviewed  with the patient and are negative.  MEDICAL HISTORY:  Past Medical History:  Diagnosis Date  . Anxiety   . 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 Breast cancer ([place]) 2017   left  . CHF (congestive heart failure) ([place])   . Dental crown present   . 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 History of congestive heart failure 10/2014  . Hypertension    states under control with  meds., has been on med. at least 89 yr.  . Insulin dependent diabetes mellitus   . Nonischemic cardiomyopathy ([place])   . Personal history of radiation therapy 2016   left  . Pneumonia     SURGICAL HISTORY: Past Surgical History:  Procedure Laterality Date  . ABDOMINAL HYSTERECTOMY  05/14/2020  . BREAST LUMPECTOMY Left 2017  . BREAST LUMPECTOMY WITH NEEDLE LOCALIZATION Left 02/17/2016   Procedure: LEFT BREAST LUMPECTOMY WITH BRACKETED NEEDLE LOCALIZATION;  Surgeon: Excell Seltzer, MD;  Location: [place];  Service: General;  Laterality: Left;  . CARDIAC CATHETERIZATION    . EXCISION / BIOPSY BREAST / NIPPLE / DUCT Left 02/17/2016   lumpectomy 2017  . HYSTERECTOMY ABDOMINAL WITH SALPINGO-OOPHORECTOMY Bilateral 05/14/2020   Procedure: HYSTERECTOMY ABDOMINAL WITH SALPINGO-OOPHORECTOMY;  Surgeon: Woodroe Mode, MD;  Location: [place];  Service: Gynecology;  Laterality: Bilateral;  . KNEE ARTHROSCOPY WITH ANTERIOR CRUCIATE LIGAMENT (ACL) REPAIR Right   . LEFT AND RIGHT HEART CATHETERIZATION WITH CORONARY ANGIOGRAM [name] 10/08/2014   Procedure: LEFT AND RIGHT HEART CATHETERIZATION WITH CORONARY ANGIOGRAM;  Surgeon: Jettie Booze, MD;  Location: [facility] CATH LAB;  Service: Cardiovascular;  Laterality: [name];  . SHOULDER ARTHROSCOPY W/ ROTATOR CUFF REPAIR Right     I have reviewed the social history and family history with the patient and they are unchanged from previous note.  ALLERGIES:  is allergic to bidil [isosorb dinitrate-hydralazine].  MEDICATIONS:  Current Outpatient Medications  Medication Sig Dispense Refill  . APPLE CIDER VINEGAR PO Take by mouth. Drinks 1-2 cap fulls once a day    . carvedilol (COREG) 12.5 MG tablet TAKE 1 TABLET(12.5 MG) BY MOUTH TWICE DAILY WITH A MEAL 180 tablet 3  . furosemide (LASIX) 80 MG tablet Take 80 mg by mouth daily as needed for fluid. For weight gain     . ibuprofen (ADVIL) 800 MG tablet Take 1 tablet (800 mg total) by mouth every 6  (six) hours. 30 tablet 0  . LEVEMIR FLEXTOUCH 100 UNIT/ML Pen Inject 80 Units into the skin at bedtime.   0  . lisinopril (PRINIVIL,ZESTRIL) 40 MG tablet Take 40 mg by mouth daily.  0  . Multiple Vitamin (MULTIVITAMIN) tablet Take 1 tablet by mouth daily.    . rosuvastatin (CRESTOR) 20 MG tablet Take 20 mg by mouth daily.    Marland Kitchen spironolactone (ALDACTONE) 25 MG tablet Take 1 tablet (25 mg total) by mouth daily. 90 tablet 3   No current facility-administered medications for this visit.    PHYSICAL EXAMINATION: ECOG PERFORMANCE STATUS: 0 - Asymptomatic  Vitals:   12/13/20 0814  BP: (!) 141/85  Pulse: 91  Resp: 18  Temp: 98.3 F (36.8 C)  SpO2: 99%   Filed Weights   12/13/20 0814  Weight: 180 lb (81.6 kg)    GENERAL:alert, no distress and comfortable SKIN: skin color, texture, turgor are normal, no rashes or significant lesions EYES: normal, Conjunctiva are pink and non-injected, sclera clear NECK: supple, thyroid normal size, non-tender, without nodularity LYMPH:  no palpable lymphadenopathy in the cervical, axillary  LUNGS: clear to auscultation and percussion with normal breathing effort HEART: regular rate & rhythm and no murmurs and no lower extremity edema ABDOMEN:abdomen soft, non-tender and normal bowel sounds Musculoskeletal:no cyanosis of digits and no clubbing  NEURO: alert & oriented x 3 with fluent speech, no focal motor/sensory deficits Breasts: Breast inspection showed them to be symmetrical with no nipple discharge.  Surgical incision in the left breast has minimum scar tissue.  Palpation of the breasts and axilla revealed no obvious mass that I could appreciate.  LABORATORY DATA:  I have reviewed the data as listed CBC Latest Ref Rng & Units 12/13/2020 05/15/2020 05/07/2020  WBC 4.0 - 10.5 K/uL 4.5 9.8 3.9(L)  Hemoglobin 12.0 - 15.0 g/dL 14.8 11.8(L) 15.0  Hematocrit 36.0 - 46.0 % 45.3 36.5 46.4(H)  Platelets 150 - 400 K/uL 268 216 251     CMP Latest Ref Rng  & Units 12/13/2020 05/07/2020 12/15/2019  Glucose 70 - 99 mg/dL 234(H) 276(H) 231(H)  BUN 6 - 20 mg/dL $Remove'11 10 13  'jeoSOhN$ Creatinine 0.44 - 1.00 mg/dL 0.89 0.56 0.80  Sodium 135 - 145 mmol/L 139 136 140  Potassium 3.5 - 5.1 mmol/L 4.0 4.3 4.2  Chloride 98 - 111 mmol/L 101 101 105  CO2 22 - 32 mmol/L $RemoveB'29 25 26  'xZfmFXcu$ Calcium 8.9 - 10.3 mg/dL 9.5 9.7 8.8(L)  Total Protein 6.5 - 8.1 g/dL 7.9 - 6.9  Total Bilirubin 0.3 - 1.2 mg/dL 0.6 - 0.4  Alkaline Phos 38 - 126 U/L 95 - 62  AST 15 - 41 U/L 14(L) - 17  ALT 0 - 44 U/L 18 - 20      RADIOGRAPHIC STUDIES: I have personally reviewed the radiological images as listed and agreed with the findings in the report. No results found.   ASSESSMENT & PLAN:  Kristen Snyder is a 55 y.o. female with    1. Breast cancer of lower-outer quadrant of left female breast, DCIS, intermediate to high grade, ER+ /PR+ -Diagnosed in 08/2015. Treated with left breast lumpectomy, radiation -she was on Tamoxifen from 05/2016 to 11/2019, stopped due to poorly controlled diabetes.  -From a breast cancer standpoint She is clinically doing well. Lab reviewed, her CBC and CMP are within normal limits except BG 234. Her physical exam and her 12/2019 mammogram were unremarkable. There is no clinical concern for recurrence. --Continue surveillance. Next mammogram on 3/4.2022  -f/u in one year   2. HTN, DM, CHF,poorly controlled hyperglycemia -f/u with PCPand continue medications -Her last A1c was 12 in 08/2020 -I advised her to better control her diet, increase exercise and to f/u with PCP more frequently for additional medication.   3. Bilateral Knee pain -She plans to have knee replacement after she controls herhyperglycemia -will monitor  4. Bone health  -Her 01/12/20 DEXA was normal with lowest T-score at -0.4 ar forearm radius.    Plan -Continue breast cancer surveillance -Mammogram on January 21, 2019. -Lab and F/u in 1 year with NP Lacie    No problem-specific  Assessment & Plan notes found for this encounter.   No orders of the defined types were placed in this encounter.  All questions were answered. The patient knows to call the clinic with any problems, questions or concerns. No barriers to learning was detected. The total time spent in the appointment was 25 minutes.     Truitt Merle, MD 12/13/2020   I, Joslyn Devon, am acting as scribe for  Truitt Merle, MD.   I have reviewed the above documentation for accuracy and completeness, and I agree with the above.

## 2020-12-13 ENCOUNTER — Other Ambulatory Visit: Payer: Self-pay

## 2020-12-13 ENCOUNTER — Inpatient Hospital Stay: Payer: BC Managed Care – PPO | Attending: Hematology

## 2020-12-13 ENCOUNTER — Encounter: Payer: Self-pay | Admitting: Hematology

## 2020-12-13 ENCOUNTER — Inpatient Hospital Stay: Payer: BC Managed Care – PPO | Admitting: Hematology

## 2020-12-13 VITALS — BP 141/85 | HR 91 | Temp 98.3°F | Resp 18 | Ht 65.0 in | Wt 180.0 lb

## 2020-12-13 DIAGNOSIS — M25562 Pain in left knee: Secondary | ICD-10-CM | POA: Diagnosis not present

## 2020-12-13 DIAGNOSIS — I509 Heart failure, unspecified: Secondary | ICD-10-CM | POA: Diagnosis not present

## 2020-12-13 DIAGNOSIS — Z794 Long term (current) use of insulin: Secondary | ICD-10-CM | POA: Insufficient documentation

## 2020-12-13 DIAGNOSIS — Z86000 Personal history of in-situ neoplasm of breast: Secondary | ICD-10-CM | POA: Diagnosis not present

## 2020-12-13 DIAGNOSIS — E1165 Type 2 diabetes mellitus with hyperglycemia: Secondary | ICD-10-CM | POA: Diagnosis not present

## 2020-12-13 DIAGNOSIS — Z79899 Other long term (current) drug therapy: Secondary | ICD-10-CM | POA: Insufficient documentation

## 2020-12-13 DIAGNOSIS — I110 Hypertensive heart disease with heart failure: Secondary | ICD-10-CM | POA: Insufficient documentation

## 2020-12-13 DIAGNOSIS — Z170 Estrogen receptor positive status [ER+]: Secondary | ICD-10-CM

## 2020-12-13 DIAGNOSIS — C50512 Malignant neoplasm of lower-outer quadrant of left female breast: Secondary | ICD-10-CM

## 2020-12-13 DIAGNOSIS — M25561 Pain in right knee: Secondary | ICD-10-CM | POA: Diagnosis not present

## 2020-12-13 LAB — CBC WITH DIFFERENTIAL/PLATELET
Abs Immature Granulocytes: 0 10*3/uL (ref 0.00–0.07)
Basophils Absolute: 0 10*3/uL (ref 0.0–0.1)
Basophils Relative: 1 %
Eosinophils Absolute: 0.1 10*3/uL (ref 0.0–0.5)
Eosinophils Relative: 3 %
HCT: 45.3 % (ref 36.0–46.0)
Hemoglobin: 14.8 g/dL (ref 12.0–15.0)
Immature Granulocytes: 0 %
Lymphocytes Relative: 32 %
Lymphs Abs: 1.5 10*3/uL (ref 0.7–4.0)
MCH: 26 pg (ref 26.0–34.0)
MCHC: 32.7 g/dL (ref 30.0–36.0)
MCV: 79.5 fL — ABNORMAL LOW (ref 80.0–100.0)
Monocytes Absolute: 0.4 10*3/uL (ref 0.1–1.0)
Monocytes Relative: 8 %
Neutro Abs: 2.5 10*3/uL (ref 1.7–7.7)
Neutrophils Relative %: 56 %
Platelets: 268 10*3/uL (ref 150–400)
RBC: 5.7 MIL/uL — ABNORMAL HIGH (ref 3.87–5.11)
RDW: 14 % (ref 11.5–15.5)
WBC: 4.5 10*3/uL (ref 4.0–10.5)
nRBC: 0 % (ref 0.0–0.2)

## 2020-12-13 LAB — COMPREHENSIVE METABOLIC PANEL
ALT: 18 U/L (ref 0–44)
AST: 14 U/L — ABNORMAL LOW (ref 15–41)
Albumin: 4.2 g/dL (ref 3.5–5.0)
Alkaline Phosphatase: 95 U/L (ref 38–126)
Anion gap: 9 (ref 5–15)
BUN: 11 mg/dL (ref 6–20)
CO2: 29 mmol/L (ref 22–32)
Calcium: 9.5 mg/dL (ref 8.9–10.3)
Chloride: 101 mmol/L (ref 98–111)
Creatinine, Ser: 0.89 mg/dL (ref 0.44–1.00)
GFR, Estimated: >60 mL/min (ref >60)
Glucose, Bld: 234 mg/dL — ABNORMAL HIGH (ref 70–99)
Potassium: 4 mmol/L (ref 3.5–5.1)
Sodium: 139 mmol/L (ref 135–145)
Total Bilirubin: 0.6 mg/dL (ref 0.3–1.2)
Total Protein: 7.9 g/dL (ref 6.5–8.1)

## 2020-12-16 ENCOUNTER — Telehealth: Payer: Self-pay | Admitting: Hematology

## 2020-12-16 NOTE — Telephone Encounter (Signed)
Scheduled appointments per 12/8 los. Mailed updated calendar to patient.  

## 2020-12-19 DIAGNOSIS — I10 Essential (primary) hypertension: Secondary | ICD-10-CM | POA: Diagnosis not present

## 2020-12-19 DIAGNOSIS — E119 Type 2 diabetes mellitus without complications: Secondary | ICD-10-CM | POA: Diagnosis not present

## 2020-12-19 DIAGNOSIS — E782 Mixed hyperlipidemia: Secondary | ICD-10-CM | POA: Diagnosis not present

## 2020-12-26 ENCOUNTER — Telehealth: Payer: Self-pay | Admitting: Cardiology

## 2020-12-26 MED ORDER — CARVEDILOL 12.5 MG PO TABS: 18.7500 mg | ORAL_TABLET | Freq: Two times a day (BID) | ORAL | 1 refills | Status: DC

## 2020-12-26 NOTE — Telephone Encounter (Signed)
Pt c/o medication issue:  1. Name of Medication: carvedilol (COREG) 12.5 MG tablet  2. How are you currently taking this medication (dosage and times per day)? 1 and a half tablets twice a day  3. Are you having a reaction (difficulty breathing--STAT)? no  4. What is your medication issue? Patient states the prescription was changed to 1 and a half tablets twice a day and needs to make sure it is also updated to 90 day refills.

## 2020-12-26 NOTE — Telephone Encounter (Signed)
Spoke with patient of Dr. Stanford Breed She states she is taking carvedilol 12.5mg  tablets - 1.5 tabs BID Last refill was only for 1 tablet BID Per review of last MD note, she is to be on 1.5 tabs BID Refilled med per request

## 2021-01-06 ENCOUNTER — Telehealth: Payer: Self-pay | Admitting: Nurse Practitioner

## 2021-01-06 NOTE — Telephone Encounter (Signed)
Left message that upcoming appointment was changed to virtual and moved per provider's template. Gave option to call back to reschedule if needed.

## 2021-01-17 ENCOUNTER — Other Ambulatory Visit: Payer: Self-pay

## 2021-01-17 ENCOUNTER — Ambulatory Visit
Admission: RE | Admit: 2021-01-17 | Discharge: 2021-01-17 | Disposition: A | Discharge status: Home / Self Care | Payer: BC Managed Care – PPO | Source: Ambulatory Visit | Attending: Hematology | Admitting: Hematology

## 2021-01-17 DIAGNOSIS — Z853 Personal history of malignant neoplasm of breast: Secondary | ICD-10-CM

## 2021-01-17 DIAGNOSIS — R922 Inconclusive mammogram: Secondary | ICD-10-CM | POA: Diagnosis not present

## 2021-03-22 ENCOUNTER — Other Ambulatory Visit: Payer: Self-pay | Admitting: Cardiology

## 2021-04-25 ENCOUNTER — Other Ambulatory Visit: Payer: Self-pay | Admitting: Cardiology

## 2021-05-05 DIAGNOSIS — E782 Mixed hyperlipidemia: Secondary | ICD-10-CM | POA: Diagnosis not present

## 2021-05-05 DIAGNOSIS — I10 Essential (primary) hypertension: Secondary | ICD-10-CM | POA: Diagnosis not present

## 2021-05-05 DIAGNOSIS — E119 Type 2 diabetes mellitus without complications: Secondary | ICD-10-CM | POA: Diagnosis not present

## 2021-05-27 DIAGNOSIS — E119 Type 2 diabetes mellitus without complications: Secondary | ICD-10-CM | POA: Diagnosis not present

## 2021-05-27 DIAGNOSIS — E782 Mixed hyperlipidemia: Secondary | ICD-10-CM | POA: Diagnosis not present

## 2021-05-27 DIAGNOSIS — I10 Essential (primary) hypertension: Secondary | ICD-10-CM | POA: Diagnosis not present

## 2021-06-24 ENCOUNTER — Other Ambulatory Visit: Payer: Self-pay | Admitting: Cardiology

## 2021-06-24 DIAGNOSIS — I5023 Acute on chronic systolic (congestive) heart failure: Secondary | ICD-10-CM

## 2021-08-14 DIAGNOSIS — E119 Type 2 diabetes mellitus without complications: Secondary | ICD-10-CM | POA: Diagnosis not present

## 2021-08-29 NOTE — Progress Notes (Deleted)
HPI: FU cardiomyopathy/CHF. Echo 11/15 showed EF 10-15, restrictive filling, mild LAE, mild MR, moderately elevated pulmonary pressure. TSH normal. Cath 11/15 showed normal cors, EF 15, LVEDP 25. Echocardiogram 10/21 showed ejection fraction 50-55% and grade 1 diastolic dysfunction. Since she was last seen,   Current Outpatient Medications  Medication Sig Dispense Refill   carvedilol (COREG) 12.5 MG tablet TAKE 1 AND 1/2 TABLETS TWICE DAILY WITH MEALS 270 tablet 3   APPLE CIDER VINEGAR PO Take by mouth. Drinks 1-2 cap fulls once a day     furosemide (LASIX) 80 MG tablet Take 80 mg by mouth daily as needed for fluid. For weight gain      ibuprofen (ADVIL) 800 MG tablet Take 1 tablet (800 mg total) by mouth every 6 (six) hours. 30 tablet 0   LEVEMIR FLEXTOUCH 100 UNIT/ML Pen Inject 80 Units into the skin at bedtime.   0   lisinopril (PRINIVIL,ZESTRIL) 40 MG tablet Take 40 mg by mouth daily.  0   Multiple Vitamin (MULTIVITAMIN) tablet Take 1 tablet by mouth daily.     rosuvastatin (CRESTOR) 20 MG tablet Take 20 mg by mouth daily.     spironolactone (ALDACTONE) 25 MG tablet TAKE 1 TABLET(25 MG) BY MOUTH DAILY 90 tablet 1   No current facility-administered medications for this visit.     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 at least 34 yr.   Insulin dependent diabetes mellitus    Nonischemic cardiomyopathy ([place])    Personal history of radiation therapy 2016   left   Pneumonia     Past Surgical History:  Procedure Laterality Date   ABDOMINAL HYSTERECTOMY  05/14/2020   BREAST LUMPECTOMY Left 2017   BREAST LUMPECTOMY WITH NEEDLE LOCALIZATION Left  02/17/2016   Procedure: LEFT BREAST LUMPECTOMY WITH BRACKETED NEEDLE LOCALIZATION;  Surgeon: Excell Seltzer, MD;  Location: [place];  Service: General;  Laterality: Left;   [place] / BIOPSY BREAST / NIPPLE / DUCT Left 02/17/2016   lumpectomy 2017   HYSTERECTOMY ABDOMINAL WITH SALPINGO-OOPHORECTOMY Bilateral 05/14/2020   Procedure: HYSTERECTOMY ABDOMINAL WITH SALPINGO-OOPHORECTOMY;  Surgeon: Woodroe Mode, MD;  Location: [place];  Service: Gynecology;  Laterality: Bilateral;   KNEE ARTHROSCOPY WITH ANTERIOR CRUCIATE LIGAMENT (ACL) REPAIR Right    LEFT AND RIGHT HEART CATHETERIZATION WITH CORONARY ANGIOGRAM [name] 10/08/2014   Procedure: LEFT AND RIGHT HEART CATHETERIZATION WITH CORONARY ANGIOGRAM;  Surgeon: Jettie Booze, MD;  Location: [facility] CATH LAB;  Service: Cardiovascular;  Laterality: [name];   SHOULDER ARTHROSCOPY W/ ROTATOR CUFF REPAIR Right     Social History   Socioeconomic History   Marital status: Married    Spouse name: Not on file   Number of children: 1   Years of education: Not on file   Highest education level: Not on file  Occupational History    Comment: [employer]  Tobacco Use   Smoking status: Never   Smokeless tobacco: Never  Vaping Use   Vaping Use: Never used  Substance and Sexual Activity   Alcohol use:  No    Alcohol/week: 1.0 standard drink    Types: 1 Glasses of wine per week    Comment: occasionally   Drug use: No   Sexual activity: Yes    Birth control/protection: None  Other Topics Concern   Not on file  Social History Narrative   Not on file   Social Determinants of Health   Financial Resource Strain: Not on file  Food Insecurity: Not on file  Transportation Needs: Not on file  Physical Activity: Not on file  Stress: Not on file  Social Connections: Not on file  Intimate Partner Violence: Not on file    Family History  Problem Relation Age of Onset   Diabetes Mother    Hypertension  Mother    Cancer Mother 18       colon   Hypertension Father    Diabetes Father    Cancer Father 80       prostate cancer    Heart disease Other        No family history   Cancer Paternal Aunt        breast cancer    Cancer Paternal Aunt 46       ovarian cancer     ROS: no fevers or chills, productive cough, hemoptysis, dysphasia, odynophagia, melena, hematochezia, dysuria, hematuria, rash, seizure activity, orthopnea, PND, pedal edema, claudication. Remaining systems are negative.  Physical Exam: Well-developed well-nourished in no acute distress.  Skin is warm and dry.  HEENT is normal.  Neck is supple.  Chest is clear to auscultation with normal expansion.  Cardiovascular exam is regular rate and rhythm.  Abdominal exam nontender or distended. No masses palpated. Extremities show no edema. neuro grossly intact  ECG- personally reviewed  A/P  Nonischemic cardiomyopathy-improved on most recent echocardiogram.  Continue medical therapy with ACE inhibitor and beta-blocker. Chronic combined systolic/diastolic congestive heart failure-euvolemic on exam.  Continue present medical regimen. Hypertension-BP controlled; continue present meds and follow. Hyperlipidemia-continue statin.  Check lipids and liver.  Kirk Ruths, MD

## 2021-09-05 ENCOUNTER — Ambulatory Visit: Payer: BC Managed Care – PPO | Admitting: Cardiology

## 2021-09-11 NOTE — Progress Notes (Signed)
Cardiology Office Note   Date:  09/16/2021   ID:  Kristen Snyder, DOB 09/03/1966, MRN [id number]  PCP:  Lin Landsman, MD  Cardiologist:  Kirk Ruths, MD EP: None  Chief Complaint  Patient presents with   Follow-up    CHF, CAD      History of Present Illness: Kristen Snyder is a 55 y.o. 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  She was last evaluated by cardiology at an outpatient visit with Dr. Stanford Breed 07/2020 at which time she was doing well from a cardiac standpoint without complaints. She was recommended to continue her current medications and repeat an echocardiogram. Echo 08/2020 showed EF 50-55%, G1DD, no RWMA, normal RV size/function, and trivial MR. Her cardiomyopathy history dates back to 2015 when she was found to have EF 10-15% on echo. She underwent a LHC which showed mild pLAD stenosis, otherwise normal coronaries. Repeat echocardiogram 04/2015 showed EF improved to 50-55%.  She presents today for follow-up.  She has been doing quite well over the past year.  She reports transitioning into a new role at [employer] which has been much less stressful.  She continues to exercise several days per week lifting weights.  She continues to work with her PCP to improve her blood sugars which she states have been difficult to control following her hysterectomy last year.  She was recently started on Ozempic and Farxiga with plans to repeat A1c 10/2021.  She has no complaints of chest pain, shortness of breath, DOE, palpitations, dizziness, lightheadedness, syncope, orthopnea, PND, lower extremity edema.     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 at least 54 yr.   Insulin dependent diabetes mellitus    Nonischemic cardiomyopathy ([place])    Personal history of radiation therapy 2016   left   Pneumonia     Past Surgical History:  Procedure Laterality Date   ABDOMINAL HYSTERECTOMY  05/14/2020   BREAST LUMPECTOMY Left 2017   BREAST LUMPECTOMY WITH NEEDLE LOCALIZATION Left 02/17/2016   Procedure: LEFT BREAST LUMPECTOMY WITH BRACKETED NEEDLE LOCALIZATION;  Surgeon: Excell Seltzer, MD;  Location: [facility];  Service: General;  Laterality: Left;   [place] / BIOPSY BREAST / NIPPLE / DUCT Left 02/17/2016   lumpectomy 2017   HYSTERECTOMY ABDOMINAL WITH SALPINGO-OOPHORECTOMY Bilateral 05/14/2020   Procedure: HYSTERECTOMY ABDOMINAL WITH SALPINGO-OOPHORECTOMY;  Surgeon: Woodroe Mode, MD;  Location: [facility];  Service: Gynecology;  Laterality: Bilateral;   KNEE ARTHROSCOPY WITH ANTERIOR CRUCIATE LIGAMENT (ACL) REPAIR Right    LEFT AND RIGHT HEART CATHETERIZATION WITH CORONARY ANGIOGRAM [name] 10/08/2014   Procedure: LEFT AND RIGHT HEART CATHETERIZATION WITH CORONARY ANGIOGRAM;  Surgeon: Jettie Booze, MD;  Location[facility] C[facility] LAB;  Service: Cardiovascular;  Laterality: [name];   SHOULDER ARTHROSCOPY W/ ROTATOR CUFF REPAIR Right      Current Outpatient Medications  Medication Sig Dispense Refill   APPLE CIDER VINEGAR  PO Take by mouth. Drinks 1-2 cap fulls once a day     carvedilol (COREG) 12.5 MG tablet TAKE 1 AND 1/2 TABLETS TWICE DAILY WITH MEALS 270 tablet 3   Continuous Blood Gluc Sensor (DEXCOM G6 SENSOR) MISC See admin instructions.     Continuous Blood Gluc Transmit (DEXCOM G6 TRANSMITTER) MISC REPLACE THE TRANSMITTER EVERY THREE MONTHS     FARXIGA 10 MG TABS tablet Take 10 mg by mouth daily.     ibuprofen (ADVIL) 800 MG tablet Take  1 tablet (800 mg total) by mouth every 6 (six) hours. 30 tablet 0   LEVEMIR FLEXTOUCH 100 UNIT/ML Pen Inject 80 Units into the skin at bedtime.   0   lisinopril (PRINIVIL,ZESTRIL) 40 MG tablet Take 40 mg by mouth daily.  0   Multiple Vitamin (MULTIVITAMIN) tablet Take 1 tablet by mouth daily.     OZEMPIC, 0.25 OR 0.5 MG/DOSE, 2 MG/1.5ML SOPN SMARTSIG:0.25 Milligram(s) SUB-Q Once a Week     rosuvastatin (CRESTOR) 20 MG tablet Take 20 mg by mouth daily.     spironolactone (ALDACTONE) 25 MG tablet TAKE 1 TABLET(25 MG) BY MOUTH DAILY 90 tablet 1   No current facility-administered medications for this visit.    Allergies:   Bidil [isosorb dinitrate-hydralazine]    Social History:  The patient  reports that she has never smoked. She has never used smokeless tobacco. She reports that she does not drink alcohol and does not use drugs.   Family History:  The patient's family history includes Cancer in her paternal aunt; Cancer (age of onset: 74) in her paternal aunt; Cancer (age of onset: 34) in her mother; Cancer (age of onset: 44) in her father; Diabetes in her father and mother; Heart disease in an other family member; Hypertension in her father and mother.    ROS:  Please see the history of present illness.   Otherwise, review of systems are positive for none.   All other systems are reviewed and negative.    PHYSICAL EXAM: VS:  BP 134/86 (BP Location: Right Arm, Patient Position: Sitting, Cuff Size: Normal)   Pulse 96   Ht 5\' 5"  (1.651 m)   Wt 180 lb 6.4 oz (81.8 kg)   LMP 03/16/2016 (Approximate)   SpO2 97%   BMI 30.02 kg/m  , BMI Body mass index is 30.02 kg/m. GEN: Well nourished, well developed, in no acute distress HEENT: Sclera anicteric Neck: no JVD, carotid bruits, or masses Cardiac: RRR; no murmurs, rubs, or gallops, no edema  Respiratory:  clear to auscultation bilaterally, normal work of breathing GI: soft, nontender, nondistended, + BS MS: no deformity or atrophy Skin:  warm and dry, no rash Neuro:  Strength and sensation are intact Psych: euthymic mood, full affect   EKG:  EKG is ordered today. The ekg ordered today demonstrates sinus rhythm, rate 96 bpm, PVC, no STE/D, no significant change from previous   Recent Labs: 12/13/2020: ALT 18; BUN 11; Creatinine, Ser 0.89; Hemoglobin 14.8; Platelets 268; Potassium 4.0; Sodium 139    Lipid Panel    Component Value Date/Time   CHOL 161 10/05/2014 1545   TRIG 98 10/05/2014 1545   HDL 63 10/05/2014 1545   CHOLHDL 2.6 10/05/2014 1545   VLDL 20 10/05/2014 1545   LDLCALC 78 10/05/2014 1545      Wt Readings from Last 3 Encounters:  09/16/21 180 lb 6.4 oz (81.8 kg)  12/13/20 180 lb (81.6 kg)  06/12/20 179 lb (81.2 kg)  Other studies Reviewed: Additional studies/ records that were reviewed today include:   Echocardiogram 2015: - Left ventricle: The cavity size was mildly dilated. Systolic    function was severely reduced. The estimated ejection fraction    was in the range of 10% to 15%. Severe diffuse hypokinesis with    no identifiable regional variations. Doppler parameters are    consistent with restrictive physiology, indicative of decreased    left ventricular diastolic compliance and/or increased left    atrial pressure.  - Mitral valve: There was mild regurgitation directed centrally.  - Left atrium: The atrium was mildly dilated.  - Atrial septum: No defect or patent foramen ovale was identified.  - Pulmonary arteries: Systolic pressure was moderately increased.    PA peak pressure: 49 mm Hg (S).   Echocardiogram 2016: - Left ventricle: Difficult apical windows. LVEF is approximately    50 to 55% with basal inferior hypokinesis. The cavity size was    normal. Systolic function was normal. The estimated ejection    fraction was in the range of 50% to 55%. Doppler parameters are    consistent with abnormal left ventricular relaxation (grade 1    diastolic dysfunction).   R/LHC  2015: ANGIOGRAPHIC DATA:   The left main coronary artery is widely patent.   The left anterior descending artery is a large vessel which wraps around the apex. The first diagonal vessel is medium-sized and patent. In the LAD after the origin of the first diagonal, there is some mild, eccentric atherosclerotic plaque. The mid to distal LAD is widely patent. The first diagonal vessel is widely patent.   The left circumflex artery is a large vessel. There is a large ramus vessel which is widely patent the first obtuse marginal is widely patent. The second obtuse marginal is widely patent as well.   The right coronary artery is a medium sized dominant vessel.  The posterior descending artery and posterior lateral arteries are small but patent.   LEFT VENTRICULOGRAM:  Left ventricular angiogram was done in the 30 RAO projection and revealed normal left ventricular wall motion and systolic function with an estimated ejection fraction of 15%.  LVEDP was 25 mmHg.  right atrial pressure 8/0, mean right atrial pressure 7 mmHg; RV pressure 40/ 5, RVEDP 9 mmHg; PA pressure 43/23; mean PA pressure 32 mmHg, unable to properly wedge the PA catheter. PA saturation 62%, aortic saturation 95%, cardiac output 4.9 L/m. Cardiac index 2.69.   IMPRESSIONS:   Normal left main coronary artery. Mild plaque in the mid left anterior descending artery. Widely patent left circumflex artery and its branches. Widely patent right coronary artery. Severely decreased global left ventricular systolic function.  LVEDP 25 mmHg.  Ejection fraction 15%. 6.   PA saturation 62%. Cardiac output 4.9 L/m. Cardiac index 2.69. Mild pulmonary hypertension.   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 She does have some mild atherosclerosis in the LAD but this does not explain her profound cardiomyopathy. Continue follow-up with Dr. Stanford Breed.  Echocardiogram 08/2020: 1. Left ventricular ejection  fraction, by estimation, is 50 to 55%. Left  ventricular ejection fraction by 3D volume is 50 %. The left ventricle has  low normal function. The left ventricle has no regional wall motion  abnormalities. Left ventricular  diastolic parameters are consistent with Grade I diastolic dysfunction  (impaired relaxation). The average left ventricular global longitudinal  strain is -19.4 %. The global longitudinal strain is normal.   2. Right ventricular systolic  function is normal. The right ventricular  size is normal. There is normal pulmonary artery systolic pressure.   3. The mitral valve is normal in structure. Trivial mitral valve  regurgitation. No evidence of mitral stenosis.   4. The aortic valve is normal in structure. Aortic valve regurgitation is  not visualized. No aortic stenosis is present.   5. The inferior vena cava is normal in size with greater than 50%  respiratory variability, suggesting right atrial pressure of 3 mmHg.   Comparison(s): 04/19/15 EF 50-55%.     ASSESSMENT AND PLAN:  1. Chronic combined CHF/NICM: subsequent recovery of EF. She has no volume overload complaints and appears euvolemic on exam.  She has not needed any as needed Lasix. - Continue carvedilol, lisinopril, spironolactone, and lasix  2. Non-obstructive CAD: mild LAD disease noted on R/LHC in 2015. No anginal complaints - Continue aggressive risk factor modifications to promote goal BP <130/80, LDL <70, and A1C <7  3. HTN: BP 134/86 today.  She thinks blood pressure readings at home have been similar if not a little bit better. - Continue carvedilol, lisinopril, and spironolactone - Encouraged to maintain a low-salt diet and continue monitoring BP at home.  She was asked to notify the office if BP is consistently > 130/80.  4. HLD: No recent lipids on file - Continue crestor  5. DM type 2: A1C 11.9 04/2020, subsequently started on Farxiga and [place], Ozempic, and insulin per  PCP - Requested she have her PCP fax a copy of her lab work in December to our office to review.   Current medicines are reviewed at length with the patient today.  The patient does not have concerns regarding medicines.  The following changes have been made: As above  Labs/ tests ordered today include:   Orders Placed This Encounter  Procedures   EKG 12-Lead     Disposition:   FU with Dr. Stanford Breed in 1 year or sooner if needed.  Signed, Abigail Butts, PA-C  09/16/2021 8:43 AM

## 2021-09-16 ENCOUNTER — Encounter: Payer: Self-pay | Admitting: Medical

## 2021-09-16 ENCOUNTER — Other Ambulatory Visit: Payer: Self-pay

## 2021-09-16 ENCOUNTER — Ambulatory Visit: Payer: BC Managed Care – PPO | Admitting: Medical

## 2021-09-16 VITALS — BP 134/86 | HR 96 | Ht 65.0 in | Wt 180.4 lb

## 2021-09-16 DIAGNOSIS — I428 Other cardiomyopathies: Secondary | ICD-10-CM

## 2021-09-16 DIAGNOSIS — I2510 Atherosclerotic heart disease of native coronary artery without angina pectoris: Secondary | ICD-10-CM | POA: Diagnosis not present

## 2021-09-16 DIAGNOSIS — I5042 Chronic combined systolic (congestive) and diastolic (congestive) heart failure: Secondary | ICD-10-CM

## 2021-09-16 DIAGNOSIS — I10 Essential (primary) hypertension: Secondary | ICD-10-CM | POA: Diagnosis not present

## 2021-09-16 DIAGNOSIS — E785 Hyperlipidemia, unspecified: Secondary | ICD-10-CM

## 2021-09-16 DIAGNOSIS — E119 Type 2 diabetes mellitus without complications: Secondary | ICD-10-CM

## 2021-09-16 NOTE — Patient Instructions (Signed)
Medication Instructions:  Your physician recommends that you continue on your current medications as directed. Please refer to the Current Medication list given to you today.  *If you need a refill on your cardiac medications before your next appointment, please call your pharmacy*   Lab Work: None If you have labs (blood work) drawn today and your tests are completely normal, you will receive your results only by: Kristen Snyder (if you have MyChart) OR A paper copy in the mail If you have any lab test that is abnormal or we need to change your treatment, we will call you to review the results.   Testing/Procedures: None   Follow-Up: At [facility], you and your health needs are our priority.  As part of our continuing mission to provide you with exceptional heart care, we have created designated Provider Care Teams.  These Care Teams include your primary Cardiologist (physician) and Advanced Practice Providers (APPs -  Physician Assistants and Nurse Practitioners) who all work together to provide you with the care you need, when you need it.  We recommend signing up for the patient portal called "MyChart".  Sign up information is provided on this After Visit Summary.  MyChart is used to connect with patients for Virtual Visits (Telemedicine).  Patients are able to view lab/test results, encounter notes, upcoming appointments, etc.  Non-urgent messages can be sent to your provider as well.   To learn more about what you can do with MyChart, go to [email or web address].    Your next appointment:   1 year(s)  The format for your next appointment:   In Person  Provider:   Kirk Ruths, MD   Other Instructions Please have your PCP send [place] the blood work in December.  Continue to monitor your blood pressure at home, contact us if it is greater the 130/80.  We encourage a low sodium diet.    Heart-Healthy Eating Plan Heart-healthy meal planning includes: Eating less  unhealthy fats. Eating more healthy fats. Making other changes in your diet. Talk with your doctor or a diet specialist (dietitian) to create an eating plan that is right for you. What are tips for following this plan? Cooking Avoid frying your food. Try to bake, boil, grill, or broil it instead. You can also reduce fat by: Removing the skin from poultry. Removing all visible fats from meats. Steaming vegetables in water or broth. Meal planning  At meals, divide your plate into four equal parts: Fill one-half of your plate with vegetables and green salads. Fill one-fourth of your plate with whole grains. Fill one-fourth of your plate with lean protein foods. Eat 4-5 servings of vegetables per day. A serving of vegetables is: 1 cup of raw or cooked vegetables. 2 cups of raw leafy greens. Eat 4-5 servings of fruit per day. A serving of fruit is: 1 medium whole fruit.  cup of dried fruit.  cup of fresh, frozen, or canned fruit.  cup of 100% fruit juice. Eat more foods that have soluble fiber. These are apples, broccoli, carrots, beans, peas, and barley. Try to get 20-30 g of fiber per day. Eat 4-5 servings of nuts, legumes, and seeds per week: 1 serving of dried beans or legumes equals  cup after being cooked. 1 serving of nuts is  cup. 1 serving of seeds equals 1 tablespoon. General information Eat more home-cooked food. Eat less restaurant, buffet, and fast food. Limit or avoid alcohol. Limit foods that are high in starch and sugar. Avoid fried  foods. Lose weight if you are overweight. Keep track of how much salt (sodium) you eat. This is important if you have high blood pressure. Ask your doctor to tell you more about this. Try to add vegetarian meals each week. Fats Choose healthy fats. These include olive oil and canola oil, flaxseeds, walnuts, almonds, and seeds. Eat more omega-3 fats. These include salmon, mackerel, sardines, tuna, flaxseed oil, and ground flaxseeds.  Try to eat fish at least 2 times each week. Check food labels. Avoid foods with trans fats or high amounts of saturated fat. Limit saturated fats. These are often found in animal products, such as meats, butter, and cream. These are also found in plant foods, such as palm oil, palm kernel oil, and coconut oil. Avoid foods with partially hydrogenated oils in them. These have trans fats. Examples are stick margarine, some tub margarines, cookies, crackers, and other baked goods. What foods can I eat? Fruits All fresh, canned (in natural juice), or frozen fruits. Vegetables Fresh or frozen vegetables (raw, steamed, roasted, or grilled). Green salads. Grains Most grains. Choose whole wheat and whole grains most of the time. Rice and pasta, including brown rice and pastas made with whole wheat. Meats and other proteins Lean, well-trimmed beef, veal, pork, and lamb. Chicken and [place] without skin. All fish and shellfish. Wild duck, rabbit, pheasant, and venison. Egg whites or low-cholesterol egg substitutes. Dried beans, peas, lentils, and tofu. Seeds and most nuts. Dairy Low-fat or nonfat cheeses, including ricotta and mozzarella. Skim or 1% milk that is liquid, powdered, or evaporated. Buttermilk that is made with low-fat milk. Nonfat or low-fat yogurt. Fats and oils Non-hydrogenated (trans-free) margarines. Vegetable oils, including soybean, sesame, sunflower, olive, peanut, safflower, corn, canola, and cottonseed. Salad dressings or mayonnaise made with a vegetable oil. Beverages Mineral water. Coffee and tea. Diet carbonated beverages. Sweets and desserts Sherbet, gelatin, and fruit ice. Small amounts of dark chocolate. Limit all sweets and desserts. Seasonings and condiments All seasonings and condiments. The items listed above may not be a complete list of foods and drinks you can eat. Contact a dietitian for more options. What foods should I avoid? Fruits Canned fruit in heavy syrup.  Fruit in cream or butter sauce. Fried fruit. Limit coconut. Vegetables Vegetables cooked in cheese, cream, or butter sauce. Fried vegetables. Grains Breads that are made with saturated or trans fats, oils, or whole milk. Croissants. Sweet rolls. Donuts. High-fat crackers, such as cheese crackers. Meats and other proteins Fatty meats, such as hot dogs, ribs, sausage, bacon, rib-eye roast or steak. High-fat deli meats, such as salami and bologna. Caviar. Domestic duck and goose. Organ meats, such as liver. Dairy Cream, sour cream, cream cheese, and creamed cottage cheese. Whole-milk cheeses. Whole or 2% milk that is liquid, evaporated, or condensed. Whole buttermilk. Cream sauce or high-fat cheese sauce. Yogurt that is made from whole milk. Fats and oils Meat fat, or shortening. Cocoa butter, hydrogenated oils, palm oil, coconut oil, palm kernel oil. Solid fats and shortenings, including bacon fat, salt pork, lard, and butter. Nondairy cream substitutes. Salad dressings with cheese or sour cream. Beverages Regular sodas and juice drinks with added sugar. Sweets and desserts Frosting. Pudding. Cookies. Cakes. Pies. Milk chocolate or white chocolate. Buttered syrups. Full-fat ice cream or ice cream drinks. The items listed above may not be a complete list of foods and drinks to avoid. Contact a dietitian for more information. Summary Heart-healthy meal planning includes eating less unhealthy fats, eating more healthy fats, and  making other changes in your diet. Eat a balanced diet. This includes fruits and vegetables, low-fat or nonfat dairy, lean protein, nuts and legumes, whole grains, and heart-healthy oils and fats. This information is not intended to replace advice given to you by your health care provider. Make sure you discuss any questions you have with your health care provider. Document Revised: 03/13/2021 Document Reviewed: 03/13/2021 Elsevier Patient Education  2022 [employer].

## 2021-10-28 DIAGNOSIS — E119 Type 2 diabetes mellitus without complications: Secondary | ICD-10-CM | POA: Diagnosis not present

## 2021-10-28 DIAGNOSIS — R635 Abnormal weight gain: Secondary | ICD-10-CM | POA: Diagnosis not present

## 2021-11-28 ENCOUNTER — Telehealth: Payer: Self-pay

## 2021-11-28 NOTE — Telephone Encounter (Signed)
Spoke with patient regarding updated appts on 12/22/2021, instead of a phone visit with Cira Rue, NP, it will be in person per pt request.

## 2021-12-02 ENCOUNTER — Other Ambulatory Visit: Payer: Self-pay | Admitting: Hematology

## 2021-12-02 DIAGNOSIS — Z1231 Encounter for screening mammogram for malignant neoplasm of breast: Secondary | ICD-10-CM

## 2021-12-15 ENCOUNTER — Ambulatory Visit: Payer: BC Managed Care – PPO | Admitting: Nurse Practitioner

## 2021-12-15 ENCOUNTER — Other Ambulatory Visit: Payer: BC Managed Care – PPO

## 2021-12-19 ENCOUNTER — Other Ambulatory Visit: Payer: Self-pay | Admitting: *Deleted

## 2021-12-19 DIAGNOSIS — C50512 Malignant neoplasm of lower-outer quadrant of left female breast: Secondary | ICD-10-CM

## 2021-12-21 NOTE — Progress Notes (Signed)
[place]   [phone] [phone]   Clinic Follow up Note   Patient Care Team: Lin Landsman, MD as PCP - General (Family Medicine) Lelon Perla, MD as PCP - Cardiology (Cardiology) 12/22/2021  CHIEF COMPLAINT: Follow up L breast DCIS  SUMMARY OF ONCOLOGIC HISTORY: Oncology History Overview Note  Breast cancer of lower-outer quadrant of left female breast [facility])   Staging form: Breast, AJCC 7th Edition     Clinical: Stage 0 (Tis (DCIS), N0, M0) - Unsigned     Pathologic stage from 02/17/2016: Stage 0 (Tis (DCIS), N0, cM0) - Signed by Truitt Merle, MD on 03/08/2016     Breast cancer of lower-outer quadrant of left female breast ([place])  09/12/2015 Mammogram   Screening mammogram showed calcification in left breast    10/09/2015 Mammogram   diagnostoc mammo and US showed 3.0cm  loosely grouped calcification in the lower inner left breast are suspicious    10/23/2015 Initial Diagnosis   Breast cancer of lower-outer quadrant of left female breast ([place])   10/23/2015 Initial Biopsy   left breast biopsy showed DCIS and comedonecrosis    10/23/2015 Receptors her2   ER 100%+, PR 5%+   11/13/2015 Imaging   Breast MRI showed known masslike enhancement in the left breast in 2 locations, measuring 4.3 x 1.6 x 1.4 cm, and 2.6 x 1.0 x 1.0 cm. Normal appearance of the right breast. No evidence of adenopathy.   01/24/2016 Imaging   MR breast b/l w wo contrast 01/24/2016 IMPRESSION: 1. Significantly less non mass enhancement in the 2 areas of the left breast previously identified. There is residual stippled non mass enhancement between the tissue marker clips in the lower inner quadrant and slight lower outer quadrant spanning approximately 3.8 cm (between the 2 coil shaped tissue marker clips, distance between the clips on the MRI approximating 2.6 cm, approximately 5 cm apart on the CC mammogram with compression). This corresponds to the biopsy-proven foci  of DCIS and ADH. 2. Residual non mass enhancement in the lower inner quadrant, posterior depth (biopsy-proven ALH) spanning approximately 2.4 cm. 3. No new abnormalities involving either breast. 4. No pathologic lymphadenopathy.   02/17/2016 Surgery   left breast lumpectomy   02/17/2016 Pathology Results   left breast lumpectomy showed low grade DCIS, less than 0.1cm, (+) ALH, DCIS focally 0.1cm to the inferior margin   03/23/2016 - 05/07/2016 Radiation Therapy   Left breast radiation   05/30/2016 - 12/15/2019 Anti-estrogen oral therapy   Tamoxifen 50m daily started on 05/30/2016.Stopped 12/15/19 due to poorly controlled DM.    01/04/2017 Mammogram   IMPRESSION: No evidence of malignancy in either breast.   01/05/2018 Mammogram   01/05/2018 Mammogram IMPRESSION: No evidence of malignancy within either breast. Stable postsurgical changes within the left breast.     CURRENT THERAPY: Completed Tamoxifen 05/2016 - 11/2019; on surveillance   INTERVAL HISTORY: Ms. Kristen Snyder for follow up as scheduled. Last seen by Dr. FBurr Medico1/28/22. Mammogram 01/17/21 showed breast density cat C, benign fat necrosis, stable calcifications in the right breast, and overall negative.  She reports feeling well overall.  She is sometimes forgetful of things she attributes to her hysterectomy and menopause.  Working hard to control her blood sugar.  She took BP med this morning then ran out the door for this appointment.  She has a slight headache.  Denies breast concerns such as new lump/mass, nipple discharge or inversion, or skin change.  Denies recent infection, new pain,  or changes in bowel habits.  Last colonoscopy was 2002 or 2003.   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 at least 42 yr.   Insulin dependent diabetes mellitus    Nonischemic cardiomyopathy ([place])    Personal history of radiation therapy 2016   left   Pneumonia     SURGICAL HISTORY: Past Surgical History:  Procedure Laterality Date   ABDOMINAL HYSTERECTOMY  05/14/2020   BREAST LUMPECTOMY Left 2017   BREAST LUMPECTOMY WITH NEEDLE LOCALIZATION Left 02/17/2016   Procedure: LEFT BREAST LUMPECTOMY WITH BRACKETED NEEDLE LOCALIZATION;  Surgeon: Excell Seltzer, MD;  Location: [place];  Service: General;  Laterality: Left;   CARDIAC CATHETERIZATION     EXCISION / BIOPSY BREAST / NIPPLE / DUCT Left 02/17/2016   lumpectomy 2017   HYSTERECTOMY ABDOMINAL WITH SALPINGO-OOPHORECTOMY Bilateral 05/14/2020   Procedure: HYSTERECTOMY ABDOMINAL WITH SALPINGO-OOPHORECTOMY;  Surgeon: Woodroe Mode, MD;  Location: [place];  Service: Gynecology;  Laterality: Bilateral;   KNEE ARTHROSCOPY WITH ANTERIOR CRUCIATE LIGAMENT (ACL) REPAIR Right    LEFT AND RIGHT HEART CATHETERIZATION WITH CORONARY ANGIOGRAM [name] 10/08/2014   Procedure: LEFT AND RIGHT HEART CATHETERIZATION WITH CORONARY ANGIOGRAM;  Surgeon: Jettie Booze, MD;  Location[facility];  Service: Cardiovascular;  Laterality: [name];   SHOULDER ARTHROSCOPY W/ ROTATOR CUFF REPAIR Right     I have reviewed the social history and family history with the patient and they are unchanged from previous note.  ALLERGIES:  is allergic to bidil [isosorb dinitrate-hydralazine].  MEDICATIONS:  Current Outpatient Medications  Medication Sig Dispense Refill   APPLE CIDER VINEGAR PO Take by mouth. Drinks 1-2 cap fulls once a day     carvedilol (COREG) 12.5 MG tablet TAKE 1 AND 1/2 TABLETS TWICE DAILY WITH MEALS 270 tablet 3   Continuous Blood Gluc Sensor (DEXCOM G6 SENSOR) MISC See admin instructions.     Continuous Blood Gluc Transmit  (DEXCOM G6 TRANSMITTER) MISC REPLACE THE TRANSMITTER EVERY THREE MONTHS     FARXIGA 10 MG TABS tablet Take 10 mg by mouth daily.     ibuprofen (ADVIL) 800 MG tablet Take 1 tablet (800 mg total) by mouth every 6 (six) hours. 30 tablet 0   LEVEMIR FLEXTOUCH 100 UNIT/ML Pen Inject 80 Units into the skin at bedtime.   0   lisinopril (PRINIVIL,ZESTRIL) 40 MG tablet Take 40 mg by mouth daily.  0   Multiple Vitamin (MULTIVITAMIN) tablet Take 1 tablet by mouth daily.     OZEMPIC, 0.25 OR 0.5 MG/DOSE, 2 MG/1.5ML SOPN SMARTSIG:0.25 Milligram(s) SUB-Q Once a Week     rosuvastatin (CRESTOR) 20 MG tablet Take 20 mg by mouth daily.     spironolactone (ALDACTONE) 25 MG tablet TAKE 1 TABLET(25 MG) BY MOUTH DAILY 90 tablet 1   No current facility-administered medications for this visit.    PHYSICAL EXAMINATION: ECOG PERFORMANCE STATUS: 0 - Asymptomatic  Vitals:   12/22/21 0838  BP: (!) 146/99  Pulse: 92  Resp: 19  Temp: 97.6 F (36.4 C)  SpO2: 99%   Filed Weights   12/22/21  0838  Weight: 182 lb 4.8 oz (82.7 kg)    GENERAL:alert, no distress and comfortable SKIN: No rash  EYES:  sclera clear OROPHARYNX:no exudate, no erythema and lips, buccal mucosa, and tongue normal  NECK: Without mass LYMPH:  no palpable cervical or supraclavicular lymphadenopathy  LUNGS: normal breathing effort HEART:  no lower extremity edema ABDOMEN:abdomen soft, non-tender and normal bowel sounds Musculoskeletal: No focal spinal tenderness NEURO: alert & oriented x 3 with fluent speech, no focal motor/sensory deficits Breast exam: Breasts are symmetrical without nipple discharge or inversion.  S/p left lumpectomy and radiation, residual mild hyperpigmentation to the left breast.  Incision/scar completely healed with minimal scar tissue.  No palpable mass or nodularity in either breast or axilla that I could appreciate.  LABORATORY DATA:  I have reviewed the data as listed CBC Latest Ref Rng & Units 12/22/2021  12/13/2020 05/15/2020  WBC 4.0 - 10.5 K/uL 3.3(L) 4.5 9.8  Hemoglobin 12.0 - 15.0 g/dL 14.6 14.8 11.8(L)  Hematocrit 36.0 - 46.0 % 44.4 45.3 36.5  Platelets 150 - 400 K/uL 243 268 216     CMP Latest Ref Rng & Units 12/22/2021 12/13/2020 05/07/2020  Glucose 70 - 99 mg/dL 128(H) 234(H) 276(H)  BUN 6 - 20 mg/dL '11 11 10  ' Creatinine 0.44 - 1.00 mg/dL 0.64 0.89 0.56  Sodium 135 - 145 mmol/L 139 139 136  Potassium 3.5 - 5.1 mmol/L 3.9 4.0 4.3  Chloride 98 - 111 mmol/L 104 101 101  CO2 22 - 32 mmol/L '28 29 25  ' Calcium 8.9 - 10.3 mg/dL 9.3 9.5 9.7  Total Protein 6.5 - 8.1 g/dL 7.5 7.9 -  Total Bilirubin 0.3 - 1.2 mg/dL 0.5 0.6 -  Alkaline Phos 38 - 126 U/L 70 95 -  AST 15 - 41 U/L 14(L) 14(L) -  ALT 0 - 44 U/L 15 18 -      RADIOGRAPHIC STUDIES: I have personally reviewed the radiological images as listed and agreed with the findings in the report. No results found.   ASSESSMENT & PLAN: Kristen Snyder is a 56 y.o. female with      1. Breast cancer of lower-outer quadrant of left female breast, DCIS, intermediate to high grade, ER+ /PR+ -Diagnosed in 08/2015.  S/p left lumpectomy, adjuvant radiation, and tamoxifen from 06/04/2016 - 12/05/2020 stopped due to poorly controlled DM -01/17/2021 mammogram showed breast density category C, negative for malignancy -Ms. Cowell is clinically doing well.  Breast exam is benign.  Labs show mild leukopenia, otherwise unremarkable.  There is no clinical concern for breast cancer recurrence or new malignancy.  -Screening mammogram scheduled 01/19/2022  -continue annual surveillance   2. HTN, DM, CHF, poorly controlled hyperglycemia -f/u with PCP and continue medications  -Her last A1c was 12 in 08/2020 in our system -BP today 146/99, recently took meds.  We will repeat today in clinic -Continue follow-up PCP Dr. Lin Landsman    3. Bone health  -Her 01/12/20 DEXA was normal with lowest T-score at -0.4 ar forearm radius.  -She takes a multivitamin -I  encouraged her to engage in weightbearing exercise -Repeat in 2023 or 2024  4.  Age-appropriate health maintenance -She is up-to-date on vaccines and boosters -She is overdue for colonoscopy, last done in 2002 or 2003.  Her mother had colon cancer.  I strongly encouraged her to schedule her colonoscopy this year. -She is not sure who did her last colonoscopy, she will contact her PCP  Plan: -Labs reviewed -No clinical concern  for breast cancer recurrence -Continue surveillance -Mammogram 01/19/22 -continue age appropriate health maintenance, colonoscopy this year -repeat BP in clinic  -continue f/up with PCP   All questions were answered. The patient knows to call the clinic with any problems, questions or concerns. No barriers to learning were detected.     Alla Feeling, NP 12/22/21

## 2021-12-22 ENCOUNTER — Other Ambulatory Visit: Payer: Self-pay

## 2021-12-22 ENCOUNTER — Ambulatory Visit: Payer: BC Managed Care – PPO | Admitting: Nurse Practitioner

## 2021-12-22 ENCOUNTER — Encounter: Payer: Self-pay | Admitting: Nurse Practitioner

## 2021-12-22 ENCOUNTER — Inpatient Hospital Stay (HOSPITAL_BASED_OUTPATIENT_CLINIC_OR_DEPARTMENT_OTHER): Payer: BC Managed Care – PPO | Admitting: Nurse Practitioner

## 2021-12-22 ENCOUNTER — Inpatient Hospital Stay: Payer: BC Managed Care – PPO | Attending: Nurse Practitioner

## 2021-12-22 VITALS — BP 131/88 | HR 92 | Temp 97.6°F | Resp 19 | Ht 65.0 in | Wt 182.3 lb

## 2021-12-22 DIAGNOSIS — Z79899 Other long term (current) drug therapy: Secondary | ICD-10-CM | POA: Insufficient documentation

## 2021-12-22 DIAGNOSIS — C50512 Malignant neoplasm of lower-outer quadrant of left female breast: Secondary | ICD-10-CM | POA: Diagnosis not present

## 2021-12-22 DIAGNOSIS — Z923 Personal history of irradiation: Secondary | ICD-10-CM | POA: Insufficient documentation

## 2021-12-22 DIAGNOSIS — Z170 Estrogen receptor positive status [ER+]: Secondary | ICD-10-CM | POA: Insufficient documentation

## 2021-12-22 LAB — CMP (CANCER CENTER ONLY)
ALT: 15 U/L (ref 0–44)
AST: 14 U/L — ABNORMAL LOW (ref 15–41)
Albumin: 4.4 g/dL (ref 3.5–5.0)
Alkaline Phosphatase: 70 U/L (ref 38–126)
Anion gap: 7 (ref 5–15)
BUN: 11 mg/dL (ref 6–20)
CO2: 28 mmol/L (ref 22–32)
Calcium: 9.3 mg/dL (ref 8.9–10.3)
Chloride: 104 mmol/L (ref 98–111)
Creatinine: 0.64 mg/dL (ref 0.44–1.00)
GFR, Estimated: >60 mL/min (ref >60)
Glucose, Bld: 128 mg/dL — ABNORMAL HIGH (ref 70–99)
Potassium: 3.9 mmol/L (ref 3.5–5.1)
Sodium: 139 mmol/L (ref 135–145)
Total Bilirubin: 0.5 mg/dL (ref 0.3–1.2)
Total Protein: 7.5 g/dL (ref 6.5–8.1)

## 2021-12-22 LAB — CBC WITH DIFFERENTIAL (CANCER CENTER ONLY)
Abs Immature Granulocytes: 0.01 10*3/uL (ref 0.00–0.07)
Basophils Absolute: 0 10*3/uL (ref 0.0–0.1)
Basophils Relative: 1 %
Eosinophils Absolute: 0.1 10*3/uL (ref 0.0–0.5)
Eosinophils Relative: 3 %
HCT: 44.4 % (ref 36.0–46.0)
Hemoglobin: 14.6 g/dL (ref 12.0–15.0)
Immature Granulocytes: 0 %
Lymphocytes Relative: 36 %
Lymphs Abs: 1.2 10*3/uL (ref 0.7–4.0)
MCH: 25.7 pg — ABNORMAL LOW (ref 26.0–34.0)
MCHC: 32.9 g/dL (ref 30.0–36.0)
MCV: 78.2 fL — ABNORMAL LOW (ref 80.0–100.0)
Monocytes Absolute: 0.3 10*3/uL (ref 0.1–1.0)
Monocytes Relative: 9 %
Neutro Abs: 1.7 10*3/uL (ref 1.7–7.7)
Neutrophils Relative %: 51 %
Platelet Count: 243 10*3/uL (ref 150–400)
RBC: 5.68 MIL/uL — ABNORMAL HIGH (ref 3.87–5.11)
RDW: 14.8 % (ref 11.5–15.5)
WBC Count: 3.3 10*3/uL — ABNORMAL LOW (ref 4.0–10.5)
nRBC: 0 % (ref 0.0–0.2)

## 2022-01-19 ENCOUNTER — Ambulatory Visit
Admission: RE | Admit: 2022-01-19 | Discharge: 2022-01-19 | Disposition: A | Discharge status: Home / Self Care | Payer: BC Managed Care – PPO | Source: Ambulatory Visit | Attending: Hematology | Admitting: Hematology

## 2022-01-19 ENCOUNTER — Other Ambulatory Visit: Payer: Self-pay

## 2022-01-19 DIAGNOSIS — Z1231 Encounter for screening mammogram for malignant neoplasm of breast: Secondary | ICD-10-CM

## 2022-01-26 DIAGNOSIS — I10 Essential (primary) hypertension: Secondary | ICD-10-CM | POA: Diagnosis not present

## 2022-01-26 DIAGNOSIS — R5383 Other fatigue: Secondary | ICD-10-CM | POA: Diagnosis not present

## 2022-01-26 DIAGNOSIS — E782 Mixed hyperlipidemia: Secondary | ICD-10-CM | POA: Diagnosis not present

## 2022-01-26 DIAGNOSIS — E119 Type 2 diabetes mellitus without complications: Secondary | ICD-10-CM | POA: Diagnosis not present

## 2022-02-08 IMAGING — US US PELVIS COMPLETE WITH TRANSVAGINAL
1 series · 15 of 25 positions shown · non-contrast
Comparison: 08/01/2018

CLINICAL DATA: Postmenopausal bleeding, enlarged uterus

EXAM:
TRANSABDOMINAL AND TRANSVAGINAL ULTRASOUND OF PELVIS
TECHNIQUE: Both transabdominal and transvaginal ultrasound examinations of the
pelvis were performed. Transabdominal technique was performed for
global imaging of the pelvis including uterus, ovaries, adnexal
regions, and pelvic cul-de-sac. It was necessary to proceed with
endovaginal exam following the transabdominal exam to visualize the
endometrium and ovaries.

[Series 1: us pelvis complete with transvaginal · 15 of 89 slices shown]
[im 1/89]
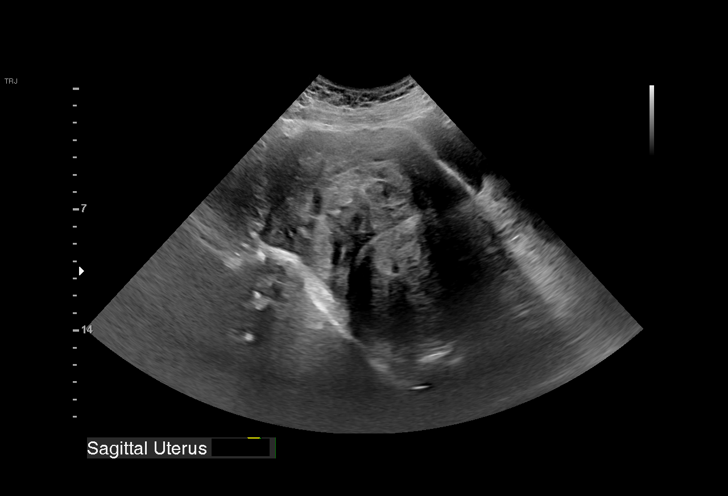
[im 8/89]
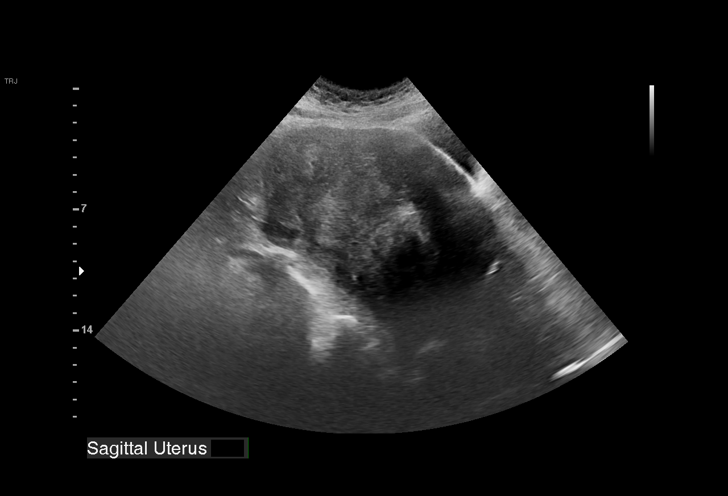
[im 15/89]
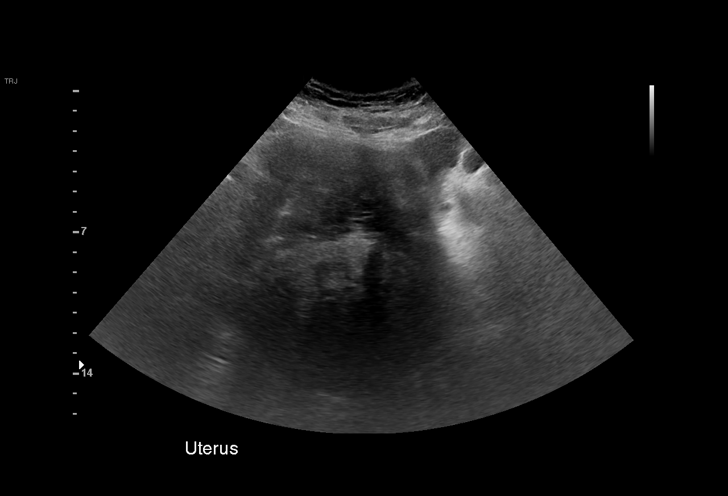
[im 19/89]
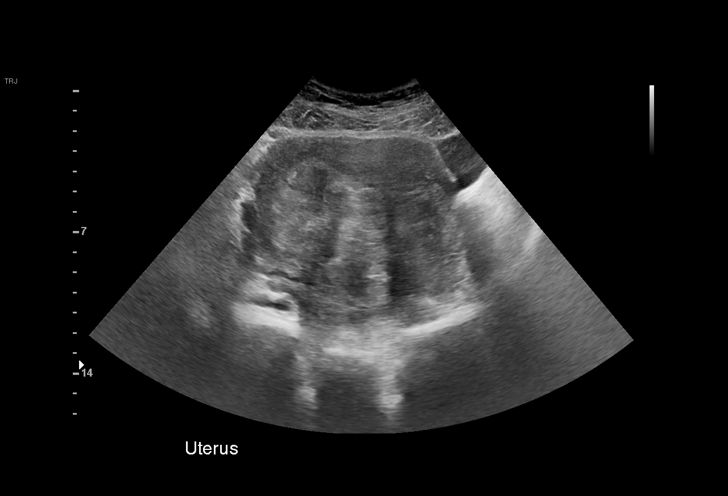
[im 26/89]
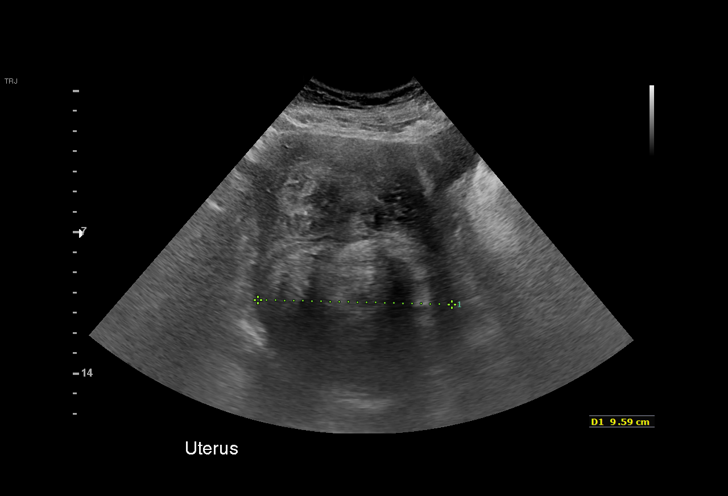
[im 34/89]
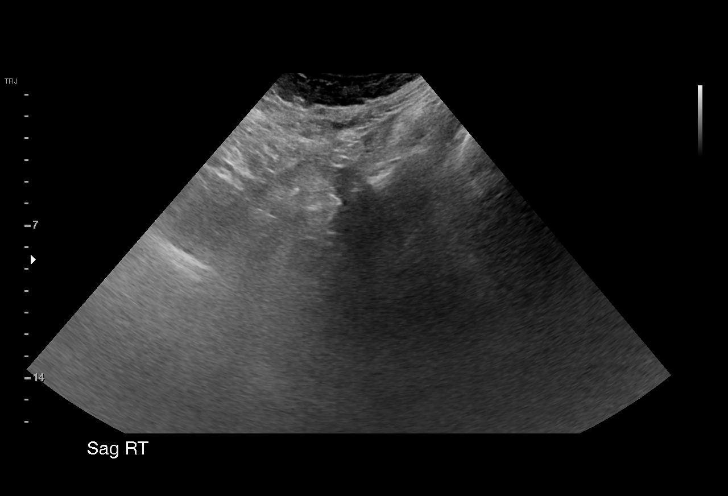
[im 37/89]
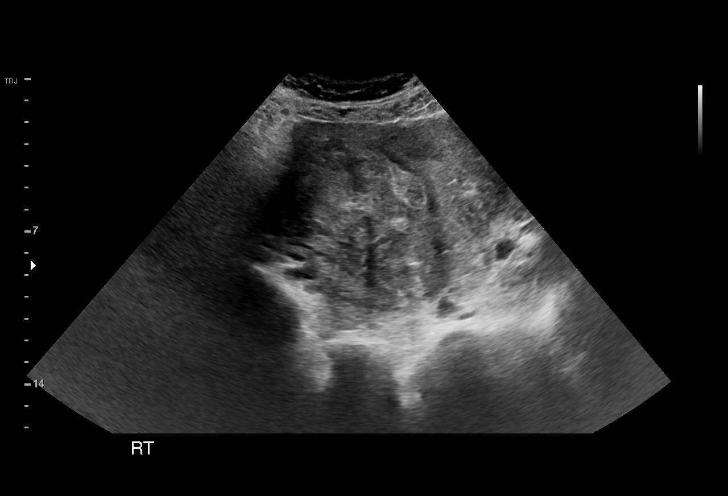
[im 45/89]
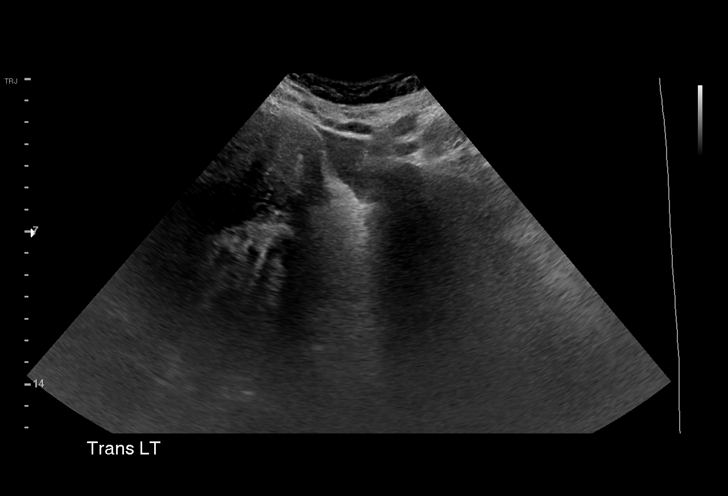
[im 52/89]
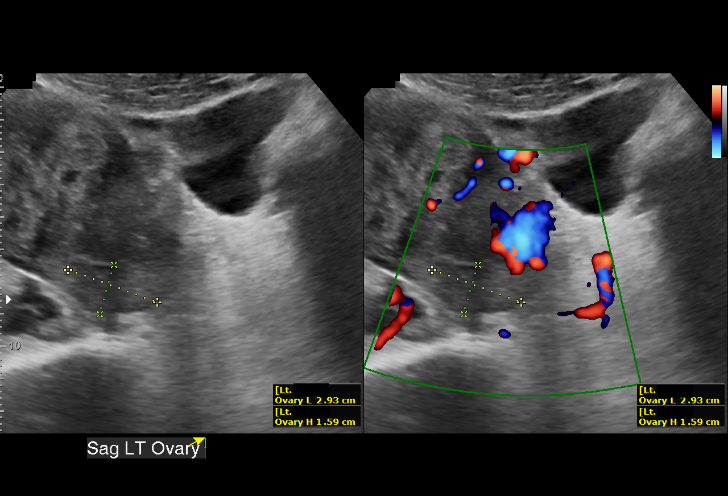
[im 56/89]
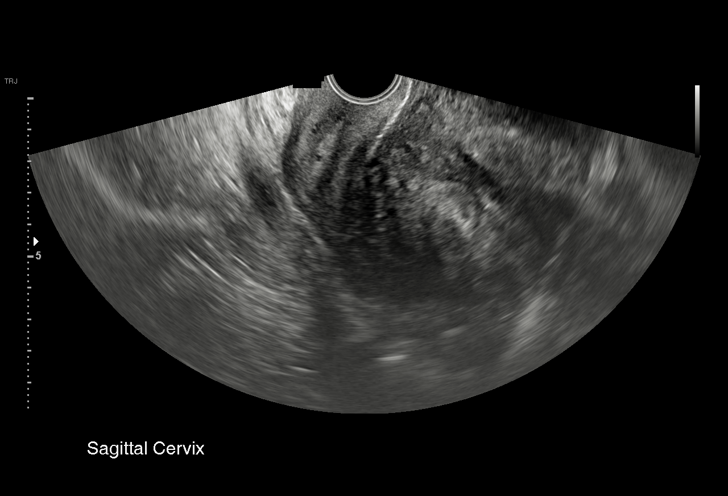
[im 63/89]
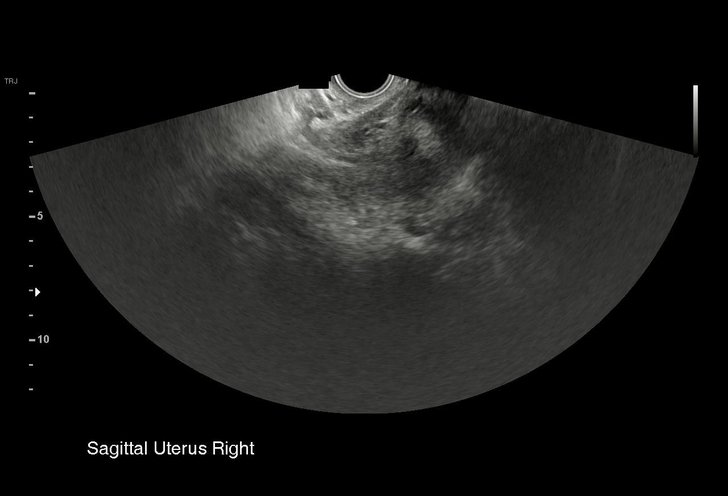
[im 70/89]
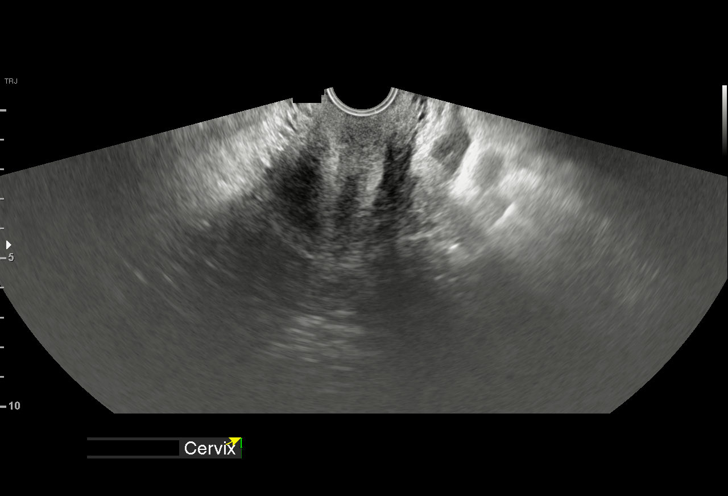
[im 74/89]
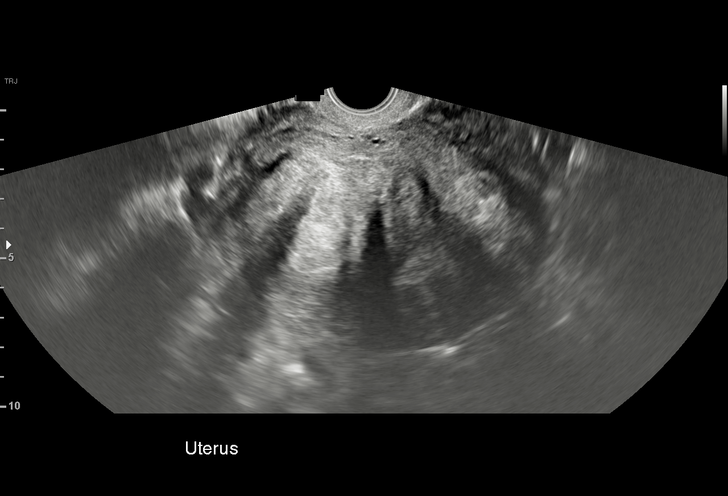
[im 81/89]
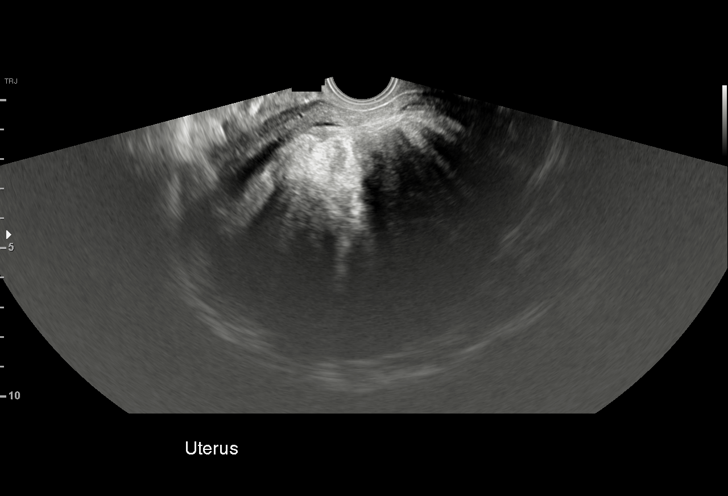
[im 89/89]
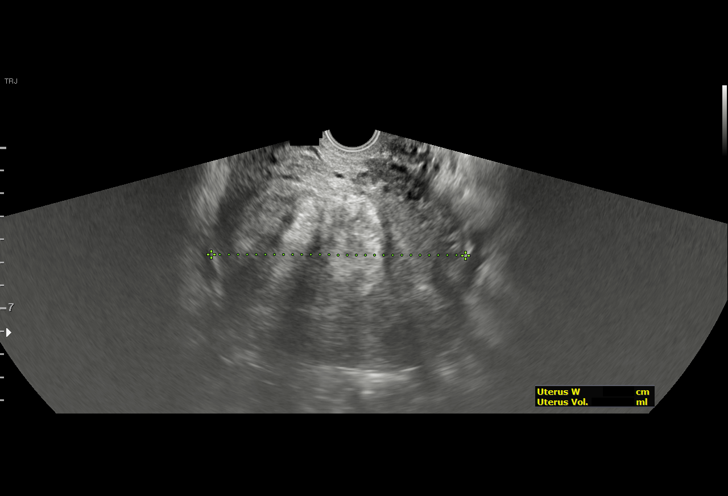

[15 of 25 positions shown; findings below may reference images not displayed]

FINDINGS: Uterus

Measurements: 22.9 x 10.3 x 11.0 cm = volume: 4854 mL. Retroverted.
Markedly enlarged and heterogeneous with multiple nodular areas of
abnormal heterogeneous echogenicity consistent with multiple
leiomyomata. Largest of these measure 8.8 x 8.0 x 9.6 cm at mid
uterus and 5.8 x 5.6 x 7.0 cm at upper uterus. Smaller lesions are
also present.

Endometrium

Obscured secondary to multiple uterine leiomyomata

Right ovary

Measurements: At 2.7 x 1.5 x 2.3 cm = volume: 4.8 mL. Normal
morphology without mass

Left ovary

Measurements: 2.9 x 1.6 x 1.8 cm = volume: 4.4 mL. Normal morphology
without mass

Other findings

Trace free pelvic fluid.  No adnexal masses.
IMPRESSION: Markedly enlarged and heterogeneous uterus containing multiple
uterine leiomyomata up to 9.6 cm and 7.0 cm in greatest sizes.

Unremarkable ovaries and adnexa.

Endometrial complex is not visualized secondary to distortion of the
uterus by multiple large leiomyomata.

## 2022-02-11 ENCOUNTER — Encounter: Payer: Self-pay | Admitting: Internal Medicine

## 2022-02-17 ENCOUNTER — Ambulatory Visit (AMBULATORY_SURGERY_CENTER): Payer: BC Managed Care – PPO | Admitting: *Deleted

## 2022-02-17 VITALS — Ht 65.0 in | Wt 178.0 lb

## 2022-02-17 DIAGNOSIS — Z800 Family history of malignant neoplasm of digestive organs: Secondary | ICD-10-CM

## 2022-02-17 MED ORDER — NA SULFATE-K SULFATE-MG SULF 17.5-3.13-1.6 GM/177ML PO SOLN: 1.0000 | Freq: Once | ORAL | 0 refills | Status: AC

## 2022-02-17 NOTE — Progress Notes (Signed)
No egg or soy allergy known to patient  ?No issues known to pt with past sedation with any surgeries or procedures ?Patient denies ever being told they had issues or difficulty with intubation  ?No FH of Malignant Hyperthermia ?Pt is not on diet pills ?Pt is not on  home 02  ?Pt is not on blood thinners  ?Pt denies issues with constipation  ?No A fib or A flutter ? ?NO PA's for preps discussed with pt In PV today  ?Discussed with pt there will be an out-of-pocket cost for prep and that varies from $0 to 70 +  dollars - pt verbalized understanding  ? ?Due to the COVID-19 pandemic we are asking patients to follow certain guidelines in PV and the [place]   ?Pt aware of COVID protocols and [facility] guidelines  ? ?PV completed over the phone. Pt verified name, DOB, address and insurance during PV today.  ?Pt mailed instruction packet with copy of consent form to read and not return, and instructions.  ?Pt encouraged to call with questions or issues.  ?

## 2022-02-26 ENCOUNTER — Encounter: Payer: Self-pay | Admitting: Internal Medicine

## 2022-03-02 ENCOUNTER — Telehealth: Payer: Self-pay | Admitting: Internal Medicine

## 2022-03-02 NOTE — Telephone Encounter (Signed)
Patient called.  She had to reschedule her procedure from an 8 a.m. appt to 2:30 p.m. this Friday.  She would like a phone call to adjust the times she should be doing her prep to reflect the afternoon time change.  Please call patient and advise. ?

## 2022-03-02 NOTE — Telephone Encounter (Signed)
Spoke with the patient, went over new prep times with the patient.  ?

## 2022-03-06 ENCOUNTER — Encounter: Payer: Self-pay | Admitting: Internal Medicine

## 2022-03-06 ENCOUNTER — Encounter: Payer: BC Managed Care – PPO | Admitting: Internal Medicine

## 2022-03-06 ENCOUNTER — Other Ambulatory Visit: Payer: Self-pay | Admitting: Cardiology

## 2022-03-06 ENCOUNTER — Ambulatory Visit (AMBULATORY_SURGERY_CENTER): Payer: BC Managed Care – PPO | Admitting: Internal Medicine

## 2022-03-06 VITALS — BP 124/85 | HR 91 | Temp 98.4°F | Resp 21 | Ht 65.0 in | Wt 178.0 lb

## 2022-03-06 DIAGNOSIS — K621 Rectal polyp: Secondary | ICD-10-CM | POA: Diagnosis not present

## 2022-03-06 DIAGNOSIS — Z800 Family history of malignant neoplasm of digestive organs: Secondary | ICD-10-CM

## 2022-03-06 DIAGNOSIS — K635 Polyp of colon: Secondary | ICD-10-CM

## 2022-03-06 DIAGNOSIS — Z1211 Encounter for screening for malignant neoplasm of colon: Secondary | ICD-10-CM

## 2022-03-06 DIAGNOSIS — D128 Benign neoplasm of rectum: Secondary | ICD-10-CM

## 2022-03-06 DIAGNOSIS — D122 Benign neoplasm of ascending colon: Secondary | ICD-10-CM

## 2022-03-06 MED ORDER — SODIUM CHLORIDE 0.9 % IV SOLN: 500.0000 mL | Freq: Once | INTRAVENOUS | Status: DC

## 2022-03-06 NOTE — Progress Notes (Signed)
No problems noted in the recovery room. maw 

## 2022-03-06 NOTE — Progress Notes (Signed)
? ?GASTROENTEROLOGY PROCEDURE H&P NOTE  ? ?Primary Care Physician: ?Lin Landsman, MD ? ? ? ?Reason for Procedure:  Colon cancer screening and patient with family history of colon cancer in her mother ? ?Plan:    Colonoscopy ? ?Patient is appropriate for endoscopic procedure(s) in the ambulatory ([place]) setting. ? ?The nature of the procedure, as well as the risks, benefits, and alternatives were carefully and thoroughly reviewed with the patient. Ample time for discussion and questions allowed. The patient understood, was satisfied, and agreed to proceed.  ? ? ? ?HPI: ?Kristen Snyder is a 56 y.o. female who presents for colonoscopy.  Medical history as below.  Tolerated the prep.  No recent chest pain or shortness of breath.  No abdominal pain today. ? ?Past Medical History:  ?Diagnosis Date  ? Anxiety   ? 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 Breast cancer ([place]) 2017  ? left  ? CHF (congestive heart failure) ([place])   ? Dental crown present   ? 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 History of congestive heart failure 10/2014  ? pt states was not CHF was pneumonia after testing  ? Hypertension   ? states under control with meds., has been on med. at least 44 yr.  ? Insulin dependent diabetes mellitus   ? Nonischemic cardiomyopathy ([place])   ? Personal history of radiation therapy 2016  ? left  ? Pneumonia   ? ? ?Past Surgical History:  ?Procedure Laterality Date  ? ABDOMINAL HYSTERECTOMY  05/14/2020  ? BREAST LUMPECTOMY Left 2017  ? BREAST LUMPECTOMY WITH NEEDLE LOCALIZATION Left 02/17/2016  ? Procedure: LEFT BREAST LUMPECTOMY WITH BRACKETED NEEDLE LOCALIZATION;  Surgeon: Excell Seltzer, MD;  Location: [place];  Service: General;  Laterality: Left;  ? CARDIAC CATHETERIZATION    ? COLONOSCOPY    ? ~ 20 yrs ago after mom dx'd with colon cancer  ? EXCISION / BIOPSY BREAST / NIPPLE / DUCT Left 02/17/2016  ? lumpectomy 2017  ? HYSTERECTOMY ABDOMINAL WITH  SALPINGO-OOPHORECTOMY Bilateral 05/14/2020  ? Procedure: HYSTERECTOMY ABDOMINAL WITH SALPINGO-OOPHORECTOMY;  Surgeon: Woodroe Mode, MD;  Location: [place];  Service: Gynecology;  Laterality: Bilateral;  ? KNEE ARTHROSCOPY WITH ANTERIOR CRUCIATE LIGAMENT (ACL) REPAIR Right   ? LEFT AND RIGHT HEART CATHETERIZATION WITH CORONARY ANGIOGRAM [name] 10/08/2014  ? Procedure: LEFT AND RIGHT HEART CATHETERIZATION WITH CORONARY ANGIOGRAM;  Surgeon: Jettie Booze, MD;  Location[facility] C[facility] LAB;  Service: Cardiovascular;  Laterality: [name];  ? SHOULDER ARTHROSCOPY W/ ROTATOR CUFF REPAIR Right   ? ? ?Prior to Admission medications   ?Medication Sig Start Date End Date Taking? Authorizing Provider  ?APPLE CIDER VINEGAR PO Take by mouth. Drinks 1-2 cap fulls once a day   Yes [provider]  ?carvedilol (COREG) 12.5 MG tablet TAKE 1 AND 1/2 TABLETS TWICE DAILY WITH MEALS 04/28/21  Yes Crenshaw, Denice Bors, MD  ?Continuous Blood Gluc Receiver (DEXCOM G6 RECEIVER) DEVI REPLACE THE METER EVERY 3 MONTHS 01/08/22  Yes [provider]  ?Continuous Blood Gluc Sensor (DEXCOM G6 SENSOR) MISC See admin instructions. 08/22/21  Yes [provider]  ?FARXIGA 10 MG TABS tablet Take 10 mg by mouth daily. 08/29/21  Yes [provider]  ?ibuprofen (ADVIL) 800 MG tablet Take 1 tablet (800 mg total) by mouth every 6 (six) hours. 05/16/20  Yes Woodroe Mode, MD  ?LEVEMIR FLEXTOUCH 100 UNIT/ML Pen Inject 80 Units into the skin at bedtime.  12/08/16  Yes [provider]  ?lisinopril (PRINIVIL,ZESTRIL) 40 MG tablet Take 40 mg by mouth daily. 04/04/16  Yes [provider]  ?Multiple Vitamin (MULTIVITAMIN) tablet Take 1 tablet by mouth daily.   Yes [provider]  ?OZEMPIC, 0.25 OR 0.5 MG/DOSE, 2 MG/1.5ML SOPN SMARTSIG:0.25 Milligram(s) SUB-Q Once a Week 07/08/21  Yes [provider]  ?rosuvastatin (CRESTOR) 20 MG tablet Take 20 mg by mouth daily.   Yes [provider]  ?spironolactone  (ALDACTONE) 25 MG tablet TAKE 1 TABLET(25 MG) BY MOUTH DAILY 06/24/21  Yes Crenshaw, Denice Bors, MD  ?Continuous Blood Gluc Transmit (DEXCOM G6 TRANSMITTER) MISC REPLACE THE TRANSMITTER EVERY THREE MONTHS 08/01/21   [provider]  ? ? ?Current Outpatient Medications  ?Medication Sig Dispense Refill  ? APPLE CIDER VINEGAR PO Take by mouth. Drinks 1-2 cap fulls once a day    ? carvedilol (COREG) 12.5 MG tablet TAKE 1 AND 1/2 TABLETS TWICE DAILY WITH MEALS 270 tablet 3  ? Continuous Blood Gluc Receiver (DEXCOM G6 RECEIVER) DEVI REPLACE THE METER EVERY 3 MONTHS    ? Continuous Blood Gluc Sensor (DEXCOM G6 SENSOR) MISC See admin instructions.    ? FARXIGA 10 MG TABS tablet Take 10 mg by mouth daily.    ? ibuprofen (ADVIL) 800 MG tablet Take 1 tablet (800 mg total) by mouth every 6 (six) hours. 30 tablet 0  ? LEVEMIR FLEXTOUCH 100 UNIT/ML Pen Inject 80 Units into the skin at bedtime.   0  ? lisinopril (PRINIVIL,ZESTRIL) 40 MG tablet Take 40 mg by mouth daily.  0  ? Multiple Vitamin (MULTIVITAMIN) tablet Take 1 tablet by mouth daily.    ? OZEMPIC, 0.25 OR 0.5 MG/DOSE, 2 MG/1.5ML SOPN SMARTSIG:0.25 Milligram(s) SUB-Q Once a Week    ? rosuvastatin (CRESTOR) 20 MG tablet Take 20 mg by mouth daily.    ? spironolactone (ALDACTONE) 25 MG tablet TAKE 1 TABLET(25 MG) BY MOUTH DAILY 90 tablet 1  ? Continuous Blood Gluc Transmit (DEXCOM G6 TRANSMITTER) MISC REPLACE THE TRANSMITTER EVERY THREE MONTHS    ? ?Current Facility-Administered Medications  ?Medication Dose Route Frequency Provider Last Rate Last Admin  ? 0.9 %  sodium chloride infusion  500 mL Intravenous Once Pami Wool, Lajuan Lines, MD      ? ? ?Allergies as of 03/06/2022 - Review Complete 03/06/2022  ?Allergen Reaction Noted  ? Bidil [isosorb dinitrate-hydralazine] Other (See Comments) 11/02/2014  ? ? ?Family History  ?Problem Relation Age of Onset  ? Colon cancer Mother   ?     dx;d early 32's  ? Diabetes Mother   ? Hypertension Mother   ? Cancer Mother 110  ?     colon  ?  Hypertension Father   ? Diabetes Father   ? Cancer Father 72  ?     prostate cancer   ? Cancer Paternal Aunt   ?     breast cancer   ? Cancer Paternal Aunt 63  ?     ovarian cancer   ? Heart disease Other   ?     No family history  ? Colon polyps Neg Hx   ? Esophageal cancer Neg Hx   ? Rectal cancer Neg Hx   ? Stomach cancer Neg Hx   ? ? ?Social History  ? ?Socioeconomic History  ? Marital status: Married  ?  Spouse name: Not on file  ? Number of children: 1  ? Years of education: Not on file  ? Highest education level:  Not on file  ?Occupational History  ?  Comment: [employer]  ?Tobacco Use  ? Smoking status: Never  ? Smokeless tobacco: Never  ?Vaping Use  ? Vaping Use: Never used  ?Substance and Sexual Activity  ? Alcohol use: No  ?  Alcohol/week: 1.0 standard drink  ?  Types: 1 Glasses of wine per week  ?  Comment: occasionally  ? Drug use: No  ? Sexual activity: Yes  ?  Birth control/protection: None  ?Other Topics Concern  ? Not on file  ?Social History Narrative  ? Not on file  ? ?Social Determinants of Health  ? ?Financial Resource Strain: Not on file  ?Food Insecurity: Not on file  ?Transportation Needs: Not on file  ?Physical Activity: Not on file  ?Stress: Not on file  ?Social Connections: Not on file  ?Intimate Partner Violence: Not on file  ? ? ?Physical Exam: ?Vital signs in last 24 hours: ?'@BP'$  127/75   Pulse 90   Temp 98.4 ?F (36.9 ?C)   Resp (!) 22   Ht '5\' 5"'$  (1.651 m)   Wt 178 lb (80.7 kg)   LMP 03/16/2016 (Approximate)   SpO2 98%   BMI 29.62 kg/m?  ?GEN: NAD ?EYE: Sclerae anicteric ?ENT: MMM ?CV: Non-tachycardic ?Pulm: CTA b/l ?GI: Soft, NT/ND ?NEURO:  Alert & Oriented x 3 ? ? ?Zenovia Jarred, MD ?[facility] ? ?03/06/2022 2:45 PM ? ?

## 2022-03-06 NOTE — Patient Instructions (Signed)
Handouts were given to your care partner on polyps and diverticulosis. ?Your sugar was 119 in the recovery room. ?You may resume your current medications today. ?Await biopsy results.  May take 1-3 weeks to receive pathology results. ?Please call if any questions or concerns. ? ? ? ? ?YOU HAD AN ENDOSCOPIC PROCEDURE TODAY AT [facility]:   Refer to the procedure report that was given to you for any specific questions about what was found during the examination.  If the procedure report does not answer your questions, please call your gastroenterologist to clarify.  If you requested that your care partner not be given the details of your procedure findings, then the procedure report has been included in a sealed envelope for you to review at your convenience later. ? ?YOU SHOULD EXPECT: Some feelings of bloating in the abdomen. Passage of more gas than usual.  Walking can help get rid of the air that was put into your GI tract during the procedure and reduce the bloating. If you had a lower endoscopy (such as a colonoscopy or flexible sigmoidoscopy) you may notice spotting of blood in your stool or on the toilet paper. If you underwent a bowel prep for your procedure, you may not have a normal bowel movement for a few days. ? ?Please Note:  You might notice some irritation and congestion in your nose or some drainage.  This is from the oxygen used during your procedure.  There is no need for concern and it should clear up in a day or so. ? ?SYMPTOMS TO REPORT IMMEDIATELY: ? ?Following lower endoscopy (colonoscopy or flexible sigmoidoscopy): ? Excessive amounts of blood in the stool ? Significant tenderness or worsening of abdominal pains ? Swelling of the abdomen that is new, acute ? Fever of 100?F or higher ? ? ?For urgent or emergent issues, a gastroenterologist can be reached at any hour by calling [phone]. ?Do not use MyChart messaging for urgent concerns.  ? ? ?DIET:  We do recommend a  small meal at first, but then you may proceed to your regular diet.  Drink plenty of fluids but you should avoid alcoholic beverages for 24 hours. ? ?ACTIVITY:  You should plan to take it easy for the rest of today and you should NOT DRIVE or use heavy machinery until tomorrow (because of the sedation medicines used during the test).   ? ?FOLLOW UP: ?Our staff will call the number listed on your records 48-72 hours following your procedure to check on you and address any questions or concerns that you may have regarding the information given to you following your procedure. If we do not reach you, we will leave a message.  We will attempt to reach you two times.  During this call, we will ask if you have developed any symptoms of COVID 19. If you develop any symptoms (ie: fever, flu-like symptoms, shortness of breath, cough etc.) before then, please call [phone].  If you test positive for Covid 19 in the 2 weeks post procedure, please call and report this information to [place].   ? ?If any biopsies were taken you will be contacted by phone or by letter within the next 1-3 weeks.  Please call us at [phone] if you have not heard about the biopsies in 3 weeks.  ? ? ?SIGNATURES/CONFIDENTIALITY: ?You and/or your care partner have signed paperwork which will be entered into your electronic medical record.  These signatures attest to the fact that that  the information above on your After Visit Summary has been reviewed and is understood.  Full responsibility of the confidentiality of this discharge information lies with you and/or your care-partner.  ?  ?

## 2022-03-06 NOTE — Progress Notes (Signed)
Pt's states no medical or surgical changes since previsit or office visit. 

## 2022-03-06 NOTE — Progress Notes (Signed)
Called to room to assist during endoscopic procedure.  Patient ID and intended procedure confirmed with present staff. Received instructions for my participation in the procedure from the performing physician.  

## 2022-03-06 NOTE — Op Note (Signed)
[facility] ?Patient Name: Kristen Snyder ?Procedure Date: 03/06/2022 2:37 PM ?MRN: [id number] ?Endoscopist: Jerene Bears , MD ?Age: 56 ?Referring MD:  ?Date of Birth: Jul 23, 1966 ?Gender: Female ?Account #: [id number] ?Procedure:                Colonoscopy ?Indications:              Screening in patient at increased risk: Colorectal  ?                          cancer in mother at age 64 ?Medicines:                Monitored Anesthesia Care ?Procedure:                Pre-Anesthesia Assessment: ?                          - Prior to the procedure, a History and Physical  ?                          was performed, and patient medications and  ?                          allergies were reviewed. The patient's tolerance of  ?                          previous anesthesia was also reviewed. The risks  ?                          and benefits of the procedure and the sedation  ?                          options and risks were discussed with the patient.  ?                          All questions were answered, and informed consent  ?                          was obtained. Prior Anticoagulants: The patient has  ?                          taken no previous anticoagulant or antiplatelet  ?                          agents. ASA Grade Assessment: II - A patient with  ?                          mild systemic disease. After reviewing the risks  ?                          and benefits, the patient was deemed in  ?                          satisfactory condition to undergo the procedure. ?  After obtaining informed consent, the colonoscope  ?                          was passed under direct vision. Throughout the  ?                          procedure, the patient's blood pressure, pulse, and  ?                          oxygen saturations were monitored continuously. The  ?                          Olympus CF-[id number] (#[id number]) Colonoscope was  ?                          introduced through the anus and  advanced to the  ?                          cecum, identified by appendiceal orifice and  ?                          ileocecal valve. The colonoscopy was performed  ?                          without difficulty. The patient tolerated the  ?                          procedure well. The quality of the bowel  ?                          preparation was good. The ileocecal valve,  ?                          appendiceal orifice, and rectum were photographed. ?Scope In: 2:50:21 PM ?Scope Out: 3:04:06 PM ?Scope Withdrawal Time: 0 hours 11 minutes 27 seconds  ?Total Procedure Duration: 0 hours 13 minutes 45 seconds  ?Findings:                 The digital rectal exam was normal. ?                          A 5 mm polyp was found in the ascending colon. The  ?                          polyp was sessile. The polyp was removed with a  ?                          cold snare. Resection and retrieval were complete. ?                          A 5 mm polyp was found in the rectum. The polyp was  ?                          sessile. The polyp  was removed with a cold snare.  ?                          Resection and retrieval were complete. ?                          Multiple small and large-mouthed diverticula were  ?                          found in the transverse colon and ascending colon. ?                          The retroflexed view of the distal rectum and anal  ?                          verge was normal and showed no anal or rectal  ?                          abnormalities. ?Complications:            No immediate complications. ?Estimated Blood Loss:     Estimated blood loss was minimal. ?Impression:               - One 5 mm polyp in the ascending colon, removed  ?                          with a cold snare. Resected and retrieved. ?                          - One 5 mm polyp in the rectum, removed with a cold  ?                          snare. Resected and retrieved. ?                          - Diverticulosis in the transverse  colon and in the  ?                          ascending colon. ?                          - The distal rectum and anal verge are normal on  ?                          retroflexion view. ?Recommendation:           - Patient has a contact number available for  ?                          emergencies. The signs and symptoms of potential  ?                          delayed complications were discussed with the  ?  patient. Return to normal activities tomorrow.  ?                          Written discharge instructions were provided to the  ?                          patient. ?                          - Resume previous diet. ?                          - Continue present medications. ?                          - Await pathology results. ?                          - Repeat colonoscopy in 5 years. ?Jerene Bears, MD ?03/06/2022 3:06:58 PM ?This report has been signed electronically. ?

## 2022-03-06 NOTE — Progress Notes (Signed)
Report to PACU RN; VSS. ?

## 2022-03-10 ENCOUNTER — Telehealth: Payer: Self-pay

## 2022-03-10 NOTE — Telephone Encounter (Signed)
Attempted to reach patient for post-procedure f/u call. No answer. Left message for her to please not hesitate to call us if she has any questions/concerns regarding her care. 

## 2022-03-10 NOTE — Telephone Encounter (Signed)
No answer, left message to call back later today, B.Rmoni Keplinger RN. 

## 2022-03-18 ENCOUNTER — Encounter: Payer: Self-pay | Admitting: Internal Medicine

## 2022-04-28 DIAGNOSIS — E119 Type 2 diabetes mellitus without complications: Secondary | ICD-10-CM | POA: Diagnosis not present

## 2022-04-28 DIAGNOSIS — E663 Overweight: Secondary | ICD-10-CM | POA: Diagnosis not present

## 2022-04-28 DIAGNOSIS — E782 Mixed hyperlipidemia: Secondary | ICD-10-CM | POA: Diagnosis not present

## 2022-04-28 DIAGNOSIS — I10 Essential (primary) hypertension: Secondary | ICD-10-CM | POA: Diagnosis not present

## 2022-06-29 DIAGNOSIS — M9905 Segmental and somatic dysfunction of pelvic region: Secondary | ICD-10-CM | POA: Diagnosis not present

## 2022-06-29 DIAGNOSIS — M9902 Segmental and somatic dysfunction of thoracic region: Secondary | ICD-10-CM | POA: Diagnosis not present

## 2022-06-29 DIAGNOSIS — M9903 Segmental and somatic dysfunction of lumbar region: Secondary | ICD-10-CM | POA: Diagnosis not present

## 2022-06-29 DIAGNOSIS — M5386 Other specified dorsopathies, lumbar region: Secondary | ICD-10-CM | POA: Diagnosis not present

## 2022-06-29 DIAGNOSIS — M9904 Segmental and somatic dysfunction of sacral region: Secondary | ICD-10-CM | POA: Diagnosis not present

## 2022-09-17 ENCOUNTER — Ambulatory Visit: Payer: BC Managed Care – PPO | Admitting: Student

## 2022-09-21 NOTE — Progress Notes (Unsigned)
Cardiology Office Note:    Date:  09/25/2022   ID:  Kristen Snyder, DOB 06/13/1966, MRN [id number]  PCP:  Lin Landsman, MD  Cardiologist:  Kirk Ruths, MD  Electrophysiologist:  None   Referring MD: Lin Landsman, MD   Chief Complaint: routine follow-up of non-ischemic cardiomyopathy  History of Present Illness:    Kristen Snyder is a 56 y.o. 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 Stanford Breed and presents today for routine follow-up.  Patient has a history of non-ischemic cardiomyopathy dating back to 2015  when an Echo showed a severely reduced EF of 10-15%. She underwent a R/LHC at that time which showed mild proximal LAD but otherwise normal coronaries. Repeat Echo in 04/2015 showed significant improvement of EF to 50-55%. Last Echo in 08/2020 showed LVEF of LVEF of 50-55% with no regional wall motion abnormalities and grade 1 diastolic dysfunction, normal RV, and no significant valvular disease.  Patient was last seen by Roby Lofts, PA-C, in 09/2021 at which time she was doing well from a cardiac standpoint.  Patient presents today for routine follow-up. She is doing very well from a cardiac standpoint. She denies any chest pain, shortness of breath, orthopnea, PND, lower extremity edema, palpitations, lightheadedness, dizziness, or syncope. She states weights are stable at home. She usually stays active using a stationary bike at home. She has not been using this as much lately as she has been very busy at her job but plans to restart this.   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 at least 54 yr.   Insulin dependent diabetes mellitus    Nonischemic cardiomyopathy (HCC)    Personal history of radiation therapy 2016   left   Pneumonia     Past Surgical History:  Procedure Laterality Date   ABDOMINAL HYSTERECTOMY  05/14/2020   BREAST LUMPECTOMY Left 2017   BREAST LUMPECTOMY WITH NEEDLE LOCALIZATION Left 02/17/2016   Procedure: LEFT BREAST LUMPECTOMY WITH BRACKETED NEEDLE LOCALIZATION;  Surgeon: Excell Seltzer, MD;  Location: [facility];  Service: General;  Laterality: Left;   CARDIAC CATHETERIZATION     COLONOSCOPY     ~ 20 yrs ago after mom dx'd with colon cancer   EXCISION / BIOPSY BREAST / NIPPLE / DUCT Left 02/17/2016   lumpectomy 2017   HYSTERECTOMY ABDOMINAL WITH SALPINGO-OOPHORECTOMY Bilateral 05/14/2020   Procedure: HYSTERECTOMY ABDOMINAL WITH SALPINGO-OOPHORECTOMY;  Surgeon: Woodroe Mode, MD;  Location: [facility];  Service: Gynecology;  Laterality: Bilateral;   KNEE ARTHROSCOPY WITH ANTERIOR CRUCIATE LIGAMENT (ACL) REPAIR Right    LEFT AND RIGHT HEART CATHETERIZATION WITH CORONARY ANGIOGRAM [name] 10/08/2014   Procedure: LEFT AND RIGHT HEART CATHETERIZATION WITH CORONARY ANGIOGRAM;  Surgeon: Jettie Booze, MD;  Location: [facility] CATH LAB;  Service: Cardiovascular;  Laterality: [name];   SHOULDER ARTHROSCOPY W/ ROTATOR CUFF REPAIR Right     Current Medications: Current Meds  Medication Sig   APPLE CIDER VINEGAR PO Take by mouth. Drinks 1-2 cap fulls once a day   carvedilol (COREG) 12.5 MG tablet TAKE 1 AND 1/2 TABLETS TWICE DAILY WITH MEALS   Continuous Blood Gluc Receiver (DEXCOM G6 RECEIVER) DEVI REPLACE THE METER EVERY 3 MONTHS    Continuous Blood Gluc Sensor (DEXCOM G6 SENSOR) MISC See admin instructions.   Continuous Blood Gluc Transmit (DEXCOM G6 TRANSMITTER) MISC REPLACE THE TRANSMITTER EVERY THREE MONTHS   FARXIGA 10 MG TABS tablet Take 10 mg by mouth daily.   ibuprofen (ADVIL) 800 MG tablet Take 1 tablet (800 mg total) by mouth every 6 (six) hours.   LEVEMIR FLEXTOUCH 100 UNIT/ML Pen Inject 80 Units into the skin at bedtime.    lisinopril (PRINIVIL,ZESTRIL) 40 MG tablet Take 40 mg by mouth daily.   Multiple Vitamin (MULTIVITAMIN) tablet Take 1 tablet by mouth daily.   OZEMPIC, 0.25 OR 0.5 MG/DOSE, 2 MG/1.5ML SOPN SMARTSIG:0.25 Milligram(s) SUB-Q Once a Week   rosuvastatin (CRESTOR) 20 MG tablet Take 20 mg by mouth daily.   spironolactone (ALDACTONE) 25 MG tablet TAKE 1 TABLET(25 MG) BY MOUTH DAILY     Allergies:   Bidil [isosorb dinitrate-hydralazine]   Social History   Socioeconomic History   Marital status: Married    Spouse name: Not on file   Number of children: 1   Years of education: Not on file   Highest education level: Not on file  Occupational History    Comment: [employer]  Tobacco Use   Smoking status: Never   Smokeless tobacco: Never  Vaping Use   Vaping Use: Never used  Substance and Sexual Activity   Alcohol use: No    Alcohol/week: 1.0 standard drink of alcohol    Types: 1 Glasses of wine per week    Comment: occasionally   Drug use: No   Sexual activity: Yes    Birth control/protection: None  Other Topics Concern   Not on file  Social History Narrative   Not on file   Social Determinants of Health   Financial Resource Strain: Not on file  Food Insecurity: Not on file  Transportation Needs: Not on file  Physical Activity: Not on file  Stress: Not on file  Social Connections: Not on file     Family History: The patient's family history includes Cancer in her paternal aunt; Cancer (age of onset: 6) in her paternal aunt; Cancer (age of onset: 32) in her mother;  Cancer (age of onset: 43) in her father; Colon cancer in her mother; Diabetes in her father and mother; Heart disease in an other family member; Hypertension in her father and mother. There is no history of Colon polyps, Esophageal cancer, Rectal cancer, or Stomach cancer.  ROS:   Please see the history of present illness.     EKGs/Labs/Other Studies Reviewed:    The following studies were reviewed:  Echocardiogram 09/06/2020: Impressions:  1. Left ventricular ejection fraction, by estimation, is 50 to 55%. Left  ventricular ejection fraction by 3D volume is 50 %. The left ventricle has  low normal function. The left ventricle has no regional wall motion  abnormalities. Left ventricular  diastolic parameters are consistent with Grade I diastolic dysfunction  (impaired relaxation). The average left ventricular global longitudinal  strain is -19.4 %. The global longitudinal strain is normal.  2. Right ventricular systolic function is normal. The right ventricular  size is normal. There is normal pulmonary artery systolic pressure.   3. The mitral valve is normal in structure. Trivial mitral valve  regurgitation. No evidence of mitral stenosis.   4. The aortic valve is normal in structure. Aortic valve regurgitation is  not visualized. No aortic stenosis is present.   5. The inferior vena cava is normal in size with greater than 50%  respiratory variability, suggesting right atrial pressure of 3 mmHg.   Comparison(s): 04/19/15 EF 50-55%.    EKG:  EKG ordered today. EKG personally reviewed and demonstrates normal sinus rhythm, rate 99 bpm, with possible left atrial enlargement and no acute ST/T changes. Normal axis. Normal PR and QRS intervals. QTc 454 ms.  Recent Labs: 12/22/2021: ALT 15; BUN 11; Creatinine 0.64; Hemoglobin 14.6; Platelet Count 243; Potassium 3.9; Sodium 139  Recent Lipid Panel    Component Value Date/Time   CHOL 161 10/05/2014 1545   TRIG 98 10/05/2014 1545   HDL 63  10/05/2014 1545   CHOLHDL 2.6 10/05/2014 1545   VLDL 20 10/05/2014 1545   LDLCALC 78 10/05/2014 1545    Physical Exam:    Vital Signs: BP 124/84   Pulse 99   Ht '5\' 4"'$  (1.626 m)   Wt 184 lb (83.5 kg)   LMP 03/16/2016 (Approximate)   SpO2 97%   BMI 31.58 kg/m     Wt Readings from Last 3 Encounters:  09/25/22 184 lb (83.5 kg)  03/06/22 178 lb (80.7 kg)  02/17/22 178 lb (80.7 kg)     General: 56 y.o. African-American female in no acute distress. HEENT: Normocephalic and atraumatic. Sclera clear.  Neck: Supple. No carotid bruits. No JVD. Heart: RRR. Distinct S1 and S2. No murmurs, gallops, or rubs. Radial pulses 2+ and equal bilaterally. Lungs: No increased work of breathing. Clear to ausculation bilaterally. No wheezes, rhonchi, or rales.  Abdomen: Soft, non-distended, and non-tender to palpation.  Extremities: No lower extremity edema.    Skin: Warm and dry. Neuro: Alert and oriented x3. No focal deficits. Psych: Normal affect. Responds appropriately.  Assessment:    1. Coronary artery disease involving native coronary artery of native heart without angina pectoris   2. Non-ischemic cardiomyopathy ([place])   3. Chronic combined systolic and diastolic CHF (congestive heart failure) ([place])   4. Primary hypertension   5. Hyperlipidemia, unspecified hyperlipidemia type   6. Type 2 diabetes mellitus with complication, with long-term current use of insulin (HCC)     Plan:    Mild Non-Obstructive CAD R/LHC in 2015 showed mild disease of proximal LAD but otherwise normal coronaries. - No angina. - She states she was previously on Aspirin but this was stopped. - Continue statin therapy.  Non-Ischemic Cardiomyopathy Chronic Combined CHF LVEF as low as 10-15% on Echo in 2015. However, LVEF has since normalized. Last Echo in 08/2020 showed LVEF of LVEF of 50-55% with no regional wall motion abnormalities and grade 1 diastolic dysfunction, normal RV, and no significant valvular  disease. - Euvolemic on exam. - Continue Lisinopril '40mg'$  daily. - Continue Coreg 18.'75mg'$  twice daily. - Continue Spironolactone 12.'5mg'$  daily. - Continue Farxiga '10mg'$  daily. - Discussed importance of closely monitoring weights and limiting sodium intake.  Hypertension BP borderline elevated at 124/84. BP goal <130/80. HR also in the high 90s. - Continue medications for CHF as above. - Asked patient to keep a BP/HR log for me for 2 weeks and then send this to me.  Will plan to increase Coreg to '25mg'$  twice daily if we need more BP or heart rate control.  Hyperlipidemia - Continue Crestor '20mg'$  daily. - Patient states labs are followed by PCP. Will see if we can get most recent labs faxed to [place].  Type 2 Diabetes Mellitus - On Farxiga, Ozempic, and Insulin (Levemir). - Management per PCP.  Disposition: Follow up in 1 year.   Medication Adjustments/Labs and Tests Ordered: Current medicines are reviewed at length with the patient today.  Concerns regarding medicines are outlined above.  Orders Placed This Encounter  Procedures   EKG 12-Lead   No orders of the defined types were placed in this encounter.   Patient Instructions  Medication Instructions:  Your physician recommends that you continue on your current medications as directed. Please refer to the Current Medication list given to you today.  *If you need a refill on your cardiac medications before your next appointment, please call your pharmacy*   Lab Work: None ordered  If you have labs (blood work) drawn today and your tests are completely normal, you will receive your results only by: [place] (if you have MyChart) OR A paper copy in the mail If you have any lab test that is abnormal or we need to change your treatment, we will call you to review the results.   Testing/Procedures: None ordered   Follow-Up: At [facility], you and your health needs are our priority.  As part of our continuing  mission to provide you with exceptional heart care, we have created designated Provider Care Teams.  These Care Teams include your primary Cardiologist (physician) and Advanced Practice Providers (APPs -  Physician Assistants and Nurse Practitioners) who all work together to provide you with the care you need, when you need it.  We recommend signing up for the patient portal called "MyChart".  Sign up information is provided on this After Visit Summary.  MyChart is used to connect with patients for Virtual Visits (Telemedicine).  Patients are able to view lab/test results, encounter notes, upcoming appointments, etc.  Non-urgent messages can be sent to your provider as well.   To learn more about what you can do with MyChart, go to [email or web address].    Your next appointment:   1 year(s)  The format for your next appointment:   In Person  Provider:   Kirk Ruths, MD     Other Instructions   Important Information About Sugar         Signed, Darreld Mclean, PA-C  09/26/2022 12:57 AM    [place]

## 2022-09-25 ENCOUNTER — Ambulatory Visit: Payer: BC Managed Care – PPO | Attending: Student | Admitting: Student

## 2022-09-25 ENCOUNTER — Encounter: Payer: Self-pay | Admitting: Student

## 2022-09-25 ENCOUNTER — Telehealth: Payer: Self-pay | Admitting: *Deleted

## 2022-09-25 VITALS — BP 124/84 | HR 99 | Ht 64.0 in | Wt 184.0 lb

## 2022-09-25 DIAGNOSIS — I2510 Atherosclerotic heart disease of native coronary artery without angina pectoris: Secondary | ICD-10-CM

## 2022-09-25 DIAGNOSIS — I5042 Chronic combined systolic (congestive) and diastolic (congestive) heart failure: Secondary | ICD-10-CM | POA: Diagnosis not present

## 2022-09-25 DIAGNOSIS — E785 Hyperlipidemia, unspecified: Secondary | ICD-10-CM

## 2022-09-25 DIAGNOSIS — Z794 Long term (current) use of insulin: Secondary | ICD-10-CM

## 2022-09-25 DIAGNOSIS — I10 Essential (primary) hypertension: Secondary | ICD-10-CM

## 2022-09-25 DIAGNOSIS — E118 Type 2 diabetes mellitus with unspecified complications: Secondary | ICD-10-CM

## 2022-09-25 DIAGNOSIS — I428 Other cardiomyopathies: Secondary | ICD-10-CM

## 2022-09-25 NOTE — Patient Instructions (Signed)
Medication Instructions:  Your physician recommends that you continue on your current medications as directed. Please refer to the Current Medication list given to you today.  *If you need a refill on your cardiac medications before your next appointment, please call your pharmacy*   Lab Work: None ordered  If you have labs (blood work) drawn today and your tests are completely normal, you will receive your results only by: [place] (if you have MyChart) OR A paper copy in the mail If you have any lab test that is abnormal or we need to change your treatment, we will call you to review the results.   Testing/Procedures: None ordered   Follow-Up: At [facility], you and your health needs are our priority.  As part of our continuing mission to provide you with exceptional heart care, we have created designated Provider Care Teams.  These Care Teams include your primary Cardiologist (physician) and Advanced Practice Providers (APPs -  Physician Assistants and Nurse Practitioners) who all work together to provide you with the care you need, when you need it.  We recommend signing up for the patient portal called "MyChart".  Sign up information is provided on this After Visit Summary.  MyChart is used to connect with patients for Virtual Visits (Telemedicine).  Patients are able to view lab/test results, encounter notes, upcoming appointments, etc.  Non-urgent messages can be sent to your provider as well.   To learn more about what you can do with MyChart, go to [email or web address].    Your next appointment:   1 year(s)  The format for your next appointment:   In Person  Provider:   Kirk Ruths, MD     Other Instructions   Important Information About Sugar

## 2022-09-25 NOTE — Telephone Encounter (Signed)
Call placed to [facility] to get a copy of most recent lipid/bmp/cmp/ whichever they have.  Office is closed on Fridays.  Will send to [facility] LPN, Veronda Prude, to call on Monday when she is back in the office.

## 2022-09-26 ENCOUNTER — Encounter: Payer: Self-pay | Admitting: Student

## 2022-09-29 NOTE — Telephone Encounter (Signed)
[place] nurse is not in today. Will fax request over, [phone]

## 2022-11-05 DIAGNOSIS — E782 Mixed hyperlipidemia: Secondary | ICD-10-CM | POA: Diagnosis not present

## 2022-11-05 DIAGNOSIS — E119 Type 2 diabetes mellitus without complications: Secondary | ICD-10-CM | POA: Diagnosis not present

## 2022-11-05 DIAGNOSIS — I10 Essential (primary) hypertension: Secondary | ICD-10-CM | POA: Diagnosis not present

## 2022-11-05 DIAGNOSIS — E663 Overweight: Secondary | ICD-10-CM | POA: Diagnosis not present

## 2022-12-18 ENCOUNTER — Other Ambulatory Visit: Payer: Self-pay | Admitting: Hematology

## 2022-12-18 DIAGNOSIS — Z1231 Encounter for screening mammogram for malignant neoplasm of breast: Secondary | ICD-10-CM

## 2022-12-20 NOTE — Assessment & Plan Note (Deleted)
-  Diagnosed in 08/2015. Treated with left breast lumpectomy, radiation -she was on Tamoxifen from 05/2016 to 11/2019, stopped due to poorly controlled diabetes.  -she is clinically doing well, breast exam unremarkable, will continue monitoring

## 2022-12-21 ENCOUNTER — Inpatient Hospital Stay: Payer: BC Managed Care – PPO | Admitting: Hematology

## 2022-12-21 ENCOUNTER — Inpatient Hospital Stay: Payer: BC Managed Care – PPO | Attending: Hematology

## 2022-12-21 DIAGNOSIS — Z170 Estrogen receptor positive status [ER+]: Secondary | ICD-10-CM

## 2022-12-21 NOTE — Progress Notes (Deleted)
[place]   [phone] [phone]   Clinic Follow up Note   Patient Care Team: Lin Landsman, MD as PCP - General (Family Medicine) Stanford Breed Denice Bors, MD as PCP - Cardiology (Cardiology)  Date of Service:  12/21/2022  CHIEF COMPLAINT: f/u of Left breast DCIS   CURRENT THERAPY:  Completed Tamoxifen 05/2016 - 11/2019; on surveillance    ASSESSMENT: *** Kristen Snyder is a 57 y.o. female with   No problem-specific Assessment & Plan notes found for this encounter.  ***   PLAN:   SUMMARY OF ONCOLOGIC HISTORY: Oncology History Overview Note  Breast cancer of lower-outer quadrant of left female breast ([place])   Staging form: Breast, AJCC 7th Edition     Clinical: Stage 0 (Tis (DCIS), N0, M0) - Unsigned     Pathologic stage from 02/17/2016: Stage 0 (Tis (DCIS), N0, cM0) - Signed by Truitt Merle, MD on 03/08/2016     Breast cancer of lower-outer quadrant of left female breast ([place])  09/12/2015 Mammogram   Screening mammogram showed calcification in left breast    10/09/2015 Mammogram   diagnostoc mammo and US showed 3.0cm  loosely grouped calcification in the lower inner left breast are suspicious    10/23/2015 Initial Diagnosis   Breast cancer of lower-outer quadrant of left female breast ([place])   10/23/2015 Initial Biopsy   left breast biopsy showed DCIS and comedonecrosis    10/23/2015 Receptors her2   ER 100%+, PR 5%+   11/13/2015 Imaging   Breast MRI showed known masslike enhancement in the left breast in 2 locations, measuring 4.3 x 1.6 x 1.4 cm, and 2.6 x 1.0 x 1.0 cm. Normal appearance of the right breast. No evidence of adenopathy.   01/24/2016 Imaging   MR breast b/l w wo contrast 01/24/2016 IMPRESSION: 1. Significantly less non mass enhancement in the 2 areas of the left breast previously identified. There is residual stippled non mass enhancement between the tissue marker clips in the lower inner quadrant and slight lower outer  quadrant spanning approximately 3.8 cm (between the 2 coil shaped tissue marker clips, distance between the clips on the MRI approximating 2.6 cm, approximately 5 cm apart on the CC mammogram with compression). This corresponds to the biopsy-proven foci of DCIS and ADH. 2. Residual non mass enhancement in the lower inner quadrant, posterior depth (biopsy-proven ALH) spanning approximately 2.4 cm. 3. No new abnormalities involving either breast. 4. No pathologic lymphadenopathy.   02/17/2016 Surgery   left breast lumpectomy   02/17/2016 Pathology Results   left breast lumpectomy showed low grade DCIS, less than 0.1cm, (+) ALH, DCIS focally 0.1cm to the inferior margin   03/23/2016 - 05/07/2016 Radiation Therapy   Left breast radiation   05/30/2016 - 12/15/2019 Anti-estrogen oral therapy   Tamoxifen '20mg'$  daily started on 05/30/2016.Stopped 12/15/19 due to poorly controlled DM.    01/04/2017 Mammogram   IMPRESSION: No evidence of malignancy in either breast.   01/05/2018 Mammogram   01/05/2018 Mammogram IMPRESSION: No evidence of malignancy within either breast. Stable postsurgical changes within the left breast.      INTERVAL HISTORY: *** Kristen Snyder is here for a follow up of Left breast DCIS  She was last seen by  NP Lacie on 12/22/2021 She presents to the clinic     All other systems were reviewed with the patient and are negative.  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 at least 26 yr.   Insulin dependent diabetes mellitus    Nonischemic cardiomyopathy ([place])    Personal  history of radiation therapy 2016   left   Pneumonia     SURGICAL HISTORY: Past Surgical History:  Procedure Laterality Date   ABDOMINAL HYSTERECTOMY  05/14/2020   BREAST LUMPECTOMY Left 2017   BREAST LUMPECTOMY WITH NEEDLE LOCALIZATION Left 02/17/2016   Procedure: LEFT BREAST LUMPECTOMY WITH BRACKETED NEEDLE LOCALIZATION;  Surgeon: Excell Seltzer, MD;  Location: [facility];  Service: General;  Laterality: Left;   CARDIAC CATHETERIZATION     COLONOSCOPY     ~ 20 yrs ago after mom dx'd with colon cancer   EXCISION / BIOPSY BREAST / NIPPLE / DUCT Left 02/17/2016   lumpectomy 2017   HYSTERECTOMY ABDOMINAL WITH SALPINGO-OOPHORECTOMY Bilateral 05/14/2020   Procedure: HYSTERECTOMY ABDOMINAL WITH SALPINGO-OOPHORECTOMY;  Surgeon: Woodroe Mode, MD;  Location: [facility];  Service: Gynecology;  Laterality: Bilateral;   KNEE ARTHROSCOPY WITH ANTERIOR CRUCIATE LIGAMENT (ACL) REPAIR Right    LEFT AND RIGHT HEART CATHETERIZATION WITH CORONARY ANGIOGRAM [name] 10/08/2014   Procedure: LEFT AND RIGHT HEART CATHETERIZATION WITH CORONARY ANGIOGRAM;  Surgeon: Jettie Booze, MD;  Location: [facility] CATH LAB;  Service: Cardiovascular;  Laterality: [name];   SHOULDER ARTHROSCOPY W/ ROTATOR CUFF REPAIR Right     I have reviewed the social history and family history with the patient and they are unchanged from previous note.  ALLERGIES:  is allergic to bidil [isosorb dinitrate-hydralazine].  MEDICATIONS:  Current Outpatient Medications  Medication Sig Dispense Refill   APPLE CIDER VINEGAR PO Take by mouth. Drinks 1-2 cap fulls once a day     carvedilol (COREG) 12.5 MG tablet TAKE 1 AND 1/2 TABLETS TWICE DAILY WITH MEALS 270 tablet 3   Continuous Blood Gluc Receiver (DEXCOM G6 RECEIVER) DEVI REPLACE THE METER EVERY 3 MONTHS     Continuous Blood Gluc Sensor (DEXCOM G6 SENSOR) MISC See admin instructions.     Continuous Blood Gluc Transmit (DEXCOM G6 TRANSMITTER) MISC REPLACE THE TRANSMITTER EVERY  THREE MONTHS     FARXIGA 10 MG TABS tablet Take 10 mg by mouth daily.     ibuprofen (ADVIL) 800 MG tablet Take 1 tablet (800 mg total) by mouth every 6 (six) hours. 30 tablet 0   LEVEMIR FLEXTOUCH 100 UNIT/ML Pen Inject 80 Units into the skin at bedtime.   0   lisinopril (PRINIVIL,ZESTRIL) 40 MG tablet Take 40 mg by mouth daily.  0   Multiple Vitamin (MULTIVITAMIN) tablet Take 1 tablet by mouth daily.     OZEMPIC, 0.25 OR 0.5 MG/DOSE, 2 MG/1.5ML SOPN SMARTSIG:0.25 Milligram(s) SUB-Q Once a Week     rosuvastatin (CRESTOR) 20 MG tablet Take 20 mg by mouth daily.     spironolactone (ALDACTONE) 25 MG tablet TAKE 1 TABLET(25 MG) BY MOUTH DAILY 90 tablet 1   No current facility-administered medications for this visit.    PHYSICAL EXAMINATION: ECOG PERFORMANCE STATUS: {CHL ONC ECOG [phone]}  There were no vitals filed for this visit. Wt Readings from Last 3 Encounters:  09/25/22  184 lb (83.5 kg)  03/06/22 178 lb (80.7 kg)  02/17/22 178 lb (80.7 kg)    {Only keep what was examined. If exam not performed, can use .CEXAM } GENERAL:alert, no distress and comfortable SKIN: skin color, texture, turgor are normal, no rashes or significant lesions EYES: normal, Conjunctiva are pink and non-injected, sclera clear {OROPHARYNX:no exudate, no erythema and lips, buccal mucosa, and tongue normal}  NECK: supple, thyroid normal size, non-tender, without nodularity LYMPH:  no palpable lymphadenopathy in the cervical, axillary {or inguinal} LUNGS: clear to auscultation and percussion with normal breathing effort HEART: regular rate & rhythm and no murmurs and no lower extremity edema ABDOMEN:abdomen soft, non-tender and normal bowel sounds Musculoskeletal:no cyanosis of digits and no clubbing  NEURO: alert & oriented x 3 with fluent speech, no focal motor/sensory deficits  LABORATORY DATA:  I have reviewed the data as listed    Latest Ref Rng & Units 12/22/2021    8:25 AM 12/13/2020    7:57 AM  05/15/2020    3:36 AM  CBC  WBC 4.0 - 10.5 K/uL 3.3  4.5  9.8   Hemoglobin 12.0 - 15.0 g/dL 14.6  14.8  11.8   Hematocrit 36.0 - 46.0 % 44.4  45.3  36.5   Platelets 150 - 400 K/uL 243  268  216         Latest Ref Rng & Units 12/22/2021    8:25 AM 12/13/2020    7:57 AM 05/07/2020    9:51 AM  CMP  Glucose 70 - 99 mg/dL 128  234  276   BUN 6 - 20 mg/dL '11  11  10   '$ Creatinine 0.44 - 1.00 mg/dL 0.64  0.89  0.56   Sodium 135 - 145 mmol/L 139  139  136   Potassium 3.5 - 5.1 mmol/L 3.9  4.0  4.3   Chloride 98 - 111 mmol/L 104  101  101   CO2 22 - 32 mmol/L '28  29  25   '$ Calcium 8.9 - 10.3 mg/dL 9.3  9.5  9.7   Total Protein 6.5 - 8.1 g/dL 7.5  7.9    Total Bilirubin 0.3 - 1.2 mg/dL 0.5  0.6    Alkaline Phos 38 - 126 U/L 70  95    AST 15 - 41 U/L 14  14    ALT 0 - 44 U/L 15  18        RADIOGRAPHIC STUDIES: I have personally reviewed the radiological images as listed and agreed with the findings in the report. No results found.    No orders of the defined types were placed in this encounter.  All questions were answered. The patient knows to call the clinic with any problems, questions or concerns. No barriers to learning was detected. The total time spent in the appointment was {CHL ONC TIME VISIT - ZX:[id number].     Baldemar Friday, CMA 12/21/2022   I, Audry Riles, CMA, am acting as scribe for Truitt Merle, MD.   {Add scribe attestation statement}

## 2023-01-29 ENCOUNTER — Ambulatory Visit
Admission: RE | Admit: 2023-01-29 | Discharge: 2023-01-29 | Disposition: A | Discharge status: Home / Self Care | Payer: BC Managed Care – PPO | Source: Ambulatory Visit | Attending: Hematology | Admitting: Hematology

## 2023-01-29 DIAGNOSIS — Z1231 Encounter for screening mammogram for malignant neoplasm of breast: Secondary | ICD-10-CM

## 2023-01-31 ENCOUNTER — Other Ambulatory Visit: Payer: Self-pay | Admitting: Cardiology

## 2023-03-19 ENCOUNTER — Other Ambulatory Visit: Payer: Self-pay

## 2023-03-19 ENCOUNTER — Telehealth: Payer: Self-pay | Admitting: Nurse Practitioner

## 2023-03-19 NOTE — Telephone Encounter (Signed)
Same number is listed for every family member as well as home and cell. Number rings a few times, then beeps, then hangs up. Multiple attempts made. Mailing patient reminders

## 2023-03-29 ENCOUNTER — Other Ambulatory Visit: Payer: Self-pay

## 2023-03-29 DIAGNOSIS — C50512 Malignant neoplasm of lower-outer quadrant of left female breast: Secondary | ICD-10-CM

## 2023-03-29 NOTE — Progress Notes (Unsigned)
Patient Care Team: Leilani Able, MD as PCP - General (Family Medicine) Lewayne Bunting, MD as PCP - Cardiology (Cardiology) Pollyann Samples, NP as Nurse Practitioner (Hematology and Oncology)   CHIEF COMPLAINT: Follow-up left breast DCIS  Oncology History Overview Note  Breast cancer of lower-outer quadrant of left female breast [facility])   Staging form: Breast, AJCC 7th Edition     Clinical: Stage 0 (Tis (DCIS), N0, M0) - Unsigned     Pathologic stage from 02/17/2016: Stage 0 (Tis (DCIS), N0, cM0) - Signed by Malachy Mood, MD on 03/08/2016     Breast cancer of lower-outer quadrant of left female breast ([facility])  09/12/2015 Mammogram   Screening mammogram showed calcification in left breast    10/09/2015 Mammogram   diagnostoc mammo and US showed 3.0cm  loosely grouped calcification in the lower inner left breast are suspicious    10/23/2015 Initial Diagnosis   Breast cancer of lower-outer quadrant of left female breast ([facility])   10/23/2015 Initial Biopsy   left breast biopsy showed DCIS and comedonecrosis    10/23/2015 Receptors her2   ER 100%+, PR 5%+   11/13/2015 Imaging   Breast MRI showed known masslike enhancement in the left breast in 2 locations, measuring 4.3 x 1.6 x 1.4 cm, and 2.6 x 1.0 x 1.0 cm. Normal appearance of the right breast. No evidence of adenopathy.   01/24/2016 Imaging   MR breast b/l w wo contrast 01/24/2016 IMPRESSION: 1. Significantly less non mass enhancement in the 2 areas of the left breast previously identified. There is residual stippled non mass enhancement between the tissue marker clips in the lower inner quadrant and slight lower outer quadrant spanning approximately 3.8 cm (between the 2 coil shaped tissue marker clips, distance between the clips on the MRI approximating 2.6 cm, approximately 5 cm apart on the CC mammogram with compression). This corresponds to the biopsy-proven foci of DCIS and ADH. 2. Residual non mass enhancement in the lower  inner quadrant, posterior depth (biopsy-proven ALH) spanning approximately 2.4 cm. 3. No new abnormalities involving either breast. 4. No pathologic lymphadenopathy.   02/17/2016 Surgery   left breast lumpectomy   02/17/2016 Pathology Results   left breast lumpectomy showed low grade DCIS, less than 0.1cm, (+) ALH, DCIS focally 0.1cm to the inferior margin   03/23/2016 - 05/07/2016 Radiation Therapy   Left breast radiation   05/30/2016 - 12/15/2019 Anti-estrogen oral therapy   Tamoxifen 20mg  daily started on 05/30/2016.Stopped 12/15/19 due to poorly controlled DM.    01/04/2017 Mammogram   IMPRESSION: No evidence of malignancy in either breast.   01/05/2018 Mammogram   01/05/2018 Mammogram IMPRESSION: No evidence of malignancy within either breast. Stable postsurgical changes within the left breast.      CURRENT THERAPY: Surveillance  INTERVAL HISTORY Ms. Sitze returns for follow-up as scheduled, last seen by me 12/22/2021 and discharged back to PCP for follow-up.  Was not sure if she needed to follow-up here.  A friend recently passed from breast cancer and she wanted to come and check in.  She has occasional bilateral breast aches, denies new lump/mass, nipple discharge or inversion, or skin change.  Last mammogram 01/29/2023 was benign.  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 at least 17 yr.   Insulin dependent diabetes mellitus    Nonischemic cardiomyopathy ([facility])    Personal history of radiation therapy 2016   left   Pneumonia      Past Surgical  History:  Procedure Laterality Date   ABDOMINAL HYSTERECTOMY  05/14/2020   BREAST LUMPECTOMY Left 2017   BREAST LUMPECTOMY WITH NEEDLE LOCALIZATION Left 02/17/2016   Procedure: LEFT BREAST LUMPECTOMY WITH BRACKETED NEEDLE LOCALIZATION;  Surgeon: Glenna Fellows, MD;  Location: [facility];  Service: General;  Laterality: Left;   CARDIAC CATHETERIZATION     COLONOSCOPY     ~ 20 yrs ago after mom dx'd with colon cancer   EXCISION / BIOPSY BREAST / NIPPLE / DUCT Left 02/17/2016   lumpectomy 2017   HYSTERECTOMY ABDOMINAL WITH SALPINGO-OOPHORECTOMY Bilateral 05/14/2020   Procedure: HYSTERECTOMY ABDOMINAL WITH SALPINGO-OOPHORECTOMY;  Surgeon: Adam Phenix, MD;  Location: [facility] OR;  Service: Gynecology;  Laterality: Bilateral;   KNEE ARTHROSCOPY WITH ANTERIOR CRUCIATE LIGAMENT (ACL) REPAIR Right    LEFT AND RIGHT HEART CATHETERIZATION WITH CORONARY ANGIOGRAM [name] 10/08/2014   Procedure: LEFT AND RIGHT HEART CATHETERIZATION WITH CORONARY ANGIOGRAM;  Surgeon: Corky Crafts, MD;  Location: [facility] CATH LAB;  Service: Cardiovascular;  Laterality: [name];   SHOULDER ARTHROSCOPY W/ ROTATOR CUFF REPAIR Right      Outpatient Encounter Medications as of 03/30/2023  Medication Sig   APPLE CIDER VINEGAR PO Take by mouth. Drinks 1-2 cap fulls once a day   carvedilol (COREG) 12.5 MG tablet TAKE 1 AND 1/2 TABLETS TWICE DAILY WITH MEALS   Continuous Glucose Sensor (DEXCOM G7 SENSOR) MISC by Does not apply route.   FARXIGA 10 MG TABS tablet Take 10 mg by mouth daily.   ibuprofen (ADVIL) 800 MG tablet Take 1 tablet (800 mg total) by mouth every 6 (six) hours.   LEVEMIR FLEXTOUCH 100 UNIT/ML Pen Inject 80 Units into the skin at bedtime.    lisinopril (PRINIVIL,ZESTRIL) 40 MG tablet Take 40 mg by mouth daily.   Multiple Vitamin (MULTIVITAMIN) tablet Take 1 tablet by mouth daily.   OZEMPIC, 0.25 OR 0.5 MG/DOSE, 2 MG/1.5ML SOPN SMARTSIG:0.25 Milligram(s) SUB-Q Once a Week   rosuvastatin (CRESTOR) 20 MG  tablet Take 20 mg by mouth daily.   spironolactone (ALDACTONE) 25 MG tablet TAKE 1 TABLET(25 MG) BY MOUTH DAILY   Continuous Blood Gluc Receiver (DEXCOM G6 RECEIVER) DEVI REPLACE THE METER EVERY 3 MONTHS (Patient not taking: Reported on 03/30/2023)   Continuous Blood Gluc Transmit (DEXCOM G6 TRANSMITTER) MISC REPLACE THE TRANSMITTER EVERY THREE MONTHS (Patient not taking: Reported on 03/30/2023)   [DISCONTINUED] Continuous Blood Gluc Sensor (DEXCOM G6 SENSOR) MISC See admin instructions.   No facility-administered encounter medications on file as of 03/30/2023.     Today's Vitals   03/30/23 1128  BP: (!) 155/93  Pulse: 94  Resp: 17  Temp: 98.8 F (37.1 C)  TempSrc: Oral  SpO2: 97%  Weight: 183 lb 8 oz (83.2 kg)   Body mass index is 31.5 kg/m.   PHYSICAL EXAM GENERAL:alert, no distress and comfortable SKIN: no rash  EYES: sclera clear NECK: without mass LYMPH:  no palpable cervical or supraclavicular lymphadenopathy  LUNGS:  normal breathing effort HEART: no lower extremity edema ABDOMEN: abdomen  soft, non-tender and normal bowel sounds NEURO: alert & oriented x 3 with fluent speech, no focal motor/sensory deficits Breast exam: No nipple discharge or inversion.  S/p left lumpectomy and radiation, incisions completely healed.  No palpable mass or nodularity in either breast or axilla that I could appreciate.   CBC    Component Value Date/Time   WBC 4.1 03/30/2023 1055   WBC 4.5 12/13/2020 0757   RBC 5.81 (H) 03/30/2023 1055   HGB 14.9 03/30/2023 1055   HGB 14.2 12/15/2016 0814   HCT 46.5 (H) 03/30/2023 1055   HCT 42.2 12/15/2016 0814   PLT 264 03/30/2023 1055   PLT 219 12/15/2016 0814   MCV 80.0 03/30/2023 1055   MCV 79.4 (L) 12/15/2016 0814   MCH 25.6 (L) 03/30/2023 1055   MCHC 32.0 03/30/2023 1055   RDW 15.0 03/30/2023 1055   RDW 14.2 12/15/2016 0814   LYMPHSABS 1.4 03/30/2023 1055   LYMPHSABS 1.1 12/15/2016 0814   MONOABS 0.3 03/30/2023 1055   MONOABS 0.3  12/15/2016 0814   EOSABS 0.2 03/30/2023 1055   EOSABS 0.1 12/15/2016 0814   BASOSABS 0.0 03/30/2023 1055   BASOSABS 0.0 12/15/2016 0814     CMP     Component Value Date/Time   NA 139 03/30/2023 1055   NA 136 12/15/2016 0814   K 4.0 03/30/2023 1055   K 4.2 12/15/2016 0814   CL 105 03/30/2023 1055   CO2 28 03/30/2023 1055   CO2 22 12/15/2016 0814   GLUCOSE 229 (H) 03/30/2023 1055   GLUCOSE 376 (H) 12/15/2016 0814   BUN 13 03/30/2023 1055   BUN 9.9 12/15/2016 0814   CREATININE 0.82 03/30/2023 1055   CREATININE 0.9 12/15/2016 0814   CALCIUM 9.4 03/30/2023 1055   CALCIUM 9.4 12/15/2016 0814   PROT 7.6 03/30/2023 1055   PROT 7.4 12/15/2016 0814   ALBUMIN 4.5 03/30/2023 1055   ALBUMIN 3.9 12/15/2016 0814   AST 15 03/30/2023 1055   AST 11 12/15/2016 0814   ALT 19 03/30/2023 1055   ALT 14 12/15/2016 0814   ALKPHOS 82 03/30/2023 1055   ALKPHOS 81 12/15/2016 0814   BILITOT 0.5 03/30/2023 1055   BILITOT 0.53 12/15/2016 0814   GFRNONAA >60 03/30/2023 1055   GFRNONAA >89 10/05/2014 1545   GFRAA >60 05/07/2020 0951   GFRAA >89 10/05/2014 1545     ASSESSMENT & PLAN:Zyairah Y Cardullo is a 57 y.o. female with      1. Breast cancer of lower-outer quadrant of left female breast, DCIS, intermediate to high grade, ER+ /PR+ -Diagnosed in 08/2015.  S/p left lumpectomy, adjuvant radiation, and tamoxifen from 06/04/2016 - 12/05/2020 stopped due to poorly controlled DM -I reviewed that she was cured in surgery, and that radiation and anti-estrogen were preventative, to reduce future recurrence risk.  -She has B/C breast density.  Currently on surveillance  -Ms. Lundell is clinically doing well.  Breast exam is benign, labs are unremarkable.  Most recent mammogram 01/29/2023 was negative. -We discussed the etiology of breast pain which is likely from dense breast tissue and previous surgery -Overall no clinical concern for recurrent or new breast cancer.  -Continue surveillance with annual  breast exam and mammogram either with me or PCP, she prefers PCP follow-up which I am comfortable with   2. HTN, DM, CHF, poorly controlled hyperglycemia -Her last A1c was 12 in 08/2020 in our system -Continue follow-up PCP Dr. Leilani Able    3. Bone health  -Her 01/12/20 DEXA was normal  with lowest T-score at -0.4 ar forearm radius.  -She takes a multivitamin -Tamoxifen had a bone strengthening quality. -I encouraged her to engage in weightbearing exercise -Repeat per PCP   4.  Age-appropriate health maintenance -She is up-to-date on vaccines and boosters -last colonoscopy 02/2022 -s/p hysterectomy  -Encouraged healthy active lifestyle    PLAN: -Recent mammogram and today's labs reviewed -Continue breast cancer surveillance with annual breast exam and mammogram in March - per PCP -F/up open   All questions were answered. The patient knows to call the clinic with any problems, questions or concerns. No barriers to learning were detected.   Santiago Glad, NP-C 03/30/2023

## 2023-03-30 ENCOUNTER — Encounter: Payer: Self-pay | Admitting: Nurse Practitioner

## 2023-03-30 ENCOUNTER — Inpatient Hospital Stay: Payer: BC Managed Care – PPO | Attending: Hematology

## 2023-03-30 ENCOUNTER — Inpatient Hospital Stay (HOSPITAL_BASED_OUTPATIENT_CLINIC_OR_DEPARTMENT_OTHER): Payer: BC Managed Care – PPO | Admitting: Nurse Practitioner

## 2023-03-30 ENCOUNTER — Other Ambulatory Visit: Payer: Self-pay

## 2023-03-30 VITALS — BP 155/93 | HR 94 | Temp 98.8°F | Resp 17 | Wt 183.5 lb

## 2023-03-30 DIAGNOSIS — C50512 Malignant neoplasm of lower-outer quadrant of left female breast: Secondary | ICD-10-CM

## 2023-03-30 DIAGNOSIS — Z08 Encounter for follow-up examination after completed treatment for malignant neoplasm: Secondary | ICD-10-CM | POA: Insufficient documentation

## 2023-03-30 DIAGNOSIS — Z853 Personal history of malignant neoplasm of breast: Secondary | ICD-10-CM | POA: Diagnosis not present

## 2023-03-30 DIAGNOSIS — Z170 Estrogen receptor positive status [ER+]: Secondary | ICD-10-CM

## 2023-03-30 LAB — CMP (CANCER CENTER ONLY)
ALT: 19 U/L (ref 0–44)
AST: 15 U/L (ref 15–41)
Albumin: 4.5 g/dL (ref 3.5–5.0)
Alkaline Phosphatase: 82 U/L (ref 38–126)
Anion gap: 6 (ref 5–15)
BUN: 13 mg/dL (ref 6–20)
CO2: 28 mmol/L (ref 22–32)
Calcium: 9.4 mg/dL (ref 8.9–10.3)
Chloride: 105 mmol/L (ref 98–111)
Creatinine: 0.82 mg/dL (ref 0.44–1.00)
GFR, Estimated: >60 mL/min (ref >60)
Glucose, Bld: 229 mg/dL — ABNORMAL HIGH (ref 70–99)
Potassium: 4 mmol/L (ref 3.5–5.1)
Sodium: 139 mmol/L (ref 135–145)
Total Bilirubin: 0.5 mg/dL (ref 0.3–1.2)
Total Protein: 7.6 g/dL (ref 6.5–8.1)

## 2023-03-30 LAB — CBC WITH DIFFERENTIAL (CANCER CENTER ONLY)
Abs Immature Granulocytes: 0.01 10*3/uL (ref 0.00–0.07)
Basophils Absolute: 0 10*3/uL (ref 0.0–0.1)
Basophils Relative: 1 %
Eosinophils Absolute: 0.2 10*3/uL (ref 0.0–0.5)
Eosinophils Relative: 4 %
HCT: 46.5 % — ABNORMAL HIGH (ref 36.0–46.0)
Hemoglobin: 14.9 g/dL (ref 12.0–15.0)
Immature Granulocytes: 0 %
Lymphocytes Relative: 35 %
Lymphs Abs: 1.4 10*3/uL (ref 0.7–4.0)
MCH: 25.6 pg — ABNORMAL LOW (ref 26.0–34.0)
MCHC: 32 g/dL (ref 30.0–36.0)
MCV: 80 fL (ref 80.0–100.0)
Monocytes Absolute: 0.3 10*3/uL (ref 0.1–1.0)
Monocytes Relative: 8 %
Neutro Abs: 2.1 10*3/uL (ref 1.7–7.7)
Neutrophils Relative %: 52 %
Platelet Count: 264 10*3/uL (ref 150–400)
RBC: 5.81 MIL/uL — ABNORMAL HIGH (ref 3.87–5.11)
RDW: 15 % (ref 11.5–15.5)
WBC Count: 4.1 10*3/uL (ref 4.0–10.5)
nRBC: 0 % (ref 0.0–0.2)

## 2023-08-24 DIAGNOSIS — E782 Mixed hyperlipidemia: Secondary | ICD-10-CM | POA: Diagnosis not present

## 2023-08-24 DIAGNOSIS — I10 Essential (primary) hypertension: Secondary | ICD-10-CM | POA: Diagnosis not present

## 2023-08-24 DIAGNOSIS — E119 Type 2 diabetes mellitus without complications: Secondary | ICD-10-CM | POA: Diagnosis not present

## 2023-09-15 NOTE — Progress Notes (Unsigned)
Cardiology Office Note:    Date:  09/27/2023   ID:  Kristen Snyder, DOB [birth year], MRN [id number]  PCP:  Leilani Able, MD  Cardiologist:  Olga Millers, MD     Referring MD: Leilani Able, MD   Chief Complaint: follow-up of non-obstructive CAD and non-ischemic cardiomyopathy  History of Present Illness:    Kristen Snyder is a 57 y.o. 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 Jens Som and presents today for routine follow-up.   Patient has a history of non-ischemic cardiomyopathy dating back to 2015 when an Echo showed a severely reduced EF of 10-15%. She underwent a R/LHC at that time which showed mild proximal LAD but otherwise normal coronaries. Repeat Echo in 04/2015 showed significant improvement of EF to 50-55%. Last Echo in 08/2020 showed LVEF of LVEF of 50-55% with no regional wall motion abnormalities and grade 1 diastolic dysfunction, normal RV, and no significant valvular disease.   She was last seen by me in 09/2022 at which time she was doing very well from a cardiac standpoint.  Patient presents today for follow-up. She is doing well from a cardiac standpoint. She denies any chest pain, shortness of breath, orthopnea, PND, edema or palpitations. She denies any lightheadedness/ dizziness (other than when her blood sugar drops too low which occurs rarely). No syncope.  She reports engaging in some exercise, including using a stationary bike and light weightlifting.   EKGs/Labs/Other Studies Reviewed:    The following studies were reviewed:  Echocardiogram 09/06/2020: Impressions:  1. Left ventricular ejection fraction, by estimation, is 50 to 55%. Left   ventricular ejection fraction by 3D volume is 50 %. The left ventricle has  low normal function. The left ventricle has no regional wall motion  abnormalities. Left ventricular  diastolic parameters are consistent with Grade I diastolic dysfunction  (impaired relaxation). The average left ventricular global longitudinal  strain is -19.4 %. The global longitudinal strain is normal.   2. Right ventricular systolic function is normal. The right ventricular  size is normal. There is normal pulmonary artery systolic pressure.   3. The mitral valve is normal in structure. Trivial mitral valve  regurgitation. No evidence of mitral stenosis.   4. The aortic valve is normal in structure. Aortic valve regurgitation is  not visualized. No aortic stenosis is present.   5. The inferior vena cava is normal in size with greater than 50%  respiratory variability, suggesting right atrial pressure of 3 mmHg.   Comparison(s): 04/19/15 EF 50-55%  EKG:  EKG ordered today.   EKG Interpretation Date/Time:  Monday September 27 2023 08:58:55 EST Ventricular Rate:  89 PR Interval:  156 QRS Duration:  82 QT Interval:  366 QTC Calculation: 445 R Axis:   8  Text Interpretation: Sinus rhythm with occasional Premature ventricular complexes Possible Left atrial enlargement Left ventricular hypertrophy ( R in aVL , Romhilt-Estes ) Nonspecific T wave abnormality No significant change since last tracing Confirmed by Marjie Skiff [phone]) on 09/27/2023 9:02:50 AM    Recent Labs: 03/30/2023: ALT 19; BUN 13; Creatinine 0.82; Hemoglobin 14.9; Platelet Count 264; Potassium 4.0; Sodium 139  Recent Lipid Panel    Component Value Date/Time   CHOL 161 10/05/2014 1545  TRIG 98 10/05/2014 1545   HDL 63 10/05/2014 1545   CHOLHDL 2.6 10/05/2014 1545   VLDL 20 10/05/2014 1545   LDLCALC 78 10/05/2014 1545    Physical Exam:    Vital Signs: BP 120/76   Pulse 89   Ht 5\' 5"  (1.651 m)   Wt 193 lb 12.8 oz (87.9 kg)    LMP 03/16/2016 (Approximate)   SpO2 96%   BMI 32.25 kg/m     Wt Readings from Last 3 Encounters:  09/27/23 193 lb 12.8 oz (87.9 kg)  03/30/23 183 lb 8 oz (83.2 kg)  09/25/22 184 lb (83.5 kg)     General: 57 y.o. obese African-American female in no acute distress. HEENT: Normocephalic and atraumatic. Sclera clear.  Neck: Supple. No carotid bruits. No JVD. Heart: RRR. Distinct S1 and S2. No murmurs, gallops, or rubs.  Lungs: No increased work of breathing. Clear to ausculation bilaterally. No wheezes, rhonchi, or rales.  Abdomen: Soft, non-distended, and non-tender to palpation.  Extremities: No lower extremity edema.   Skin: Warm and dry. Neuro: No focal deficits. Psych: Normal affect. Responds appropriately.   Assessment:    1. Coronary artery disease involving native coronary artery of native heart without angina pectoris   2. Non-ischemic cardiomyopathy (HCC)   3. Chronic combined systolic and diastolic CHF (congestive heart failure) (HCC)   4. Essential hypertension   5. Hyperlipidemia, unspecified hyperlipidemia type   6. Type 2 diabetes mellitus with complication, with long-term current use of insulin (HCC)     Plan:    Mild Non-Obstructive CAD R/LHC in 2015 showed mild disease of proximal LAD but otherwise normal coronaries. - No angina. - She states she was previously on Aspirin but this was stopped. She cannot remember why but states it was someone in our office who stopped this. - Continue statin therapy.   Non-Ischemic Cardiomyopathy Chronic Combined CHF LVEF as low as 10-15% on Echo in 2015. However, LVEF has since normalized. Last Echo in 08/2020 showed LVEF of LVEF of 50-55% with no regional wall motion abnormalities and grade 1 diastolic dysfunction, normal RV, and no significant valvular disease. - Euvolemic on exam. - Continue Lisinopril 40mg  daily. - Continue Coreg 18.75mg  twice daily. - Continue Spironolactone 25mg  daily. - Continue Farxiga 10mg   daily.   Hypertension BP well controlled.  - Continue medications for CHF as above.   Hyperlipidemia - Continue Crestor 20mg  daily. - Patient states labs are followed by PCP. Will see if we can get most recent labs faxed to [place].    Type 2 Diabetes Mellitus - On Farxiga, Ozempic, and Insulin (Levemir). - Management per PCP.  Disposition: Follow up in 1 year.    Signed, Corrin Parker, PA-C  09/27/2023 9:53 AM    [facility]

## 2023-09-27 ENCOUNTER — Ambulatory Visit: Payer: BC Managed Care – PPO | Attending: Student | Admitting: Student

## 2023-09-27 ENCOUNTER — Encounter: Payer: Self-pay | Admitting: Student

## 2023-09-27 VITALS — BP 120/76 | HR 89 | Ht 65.0 in | Wt 193.8 lb

## 2023-09-27 DIAGNOSIS — I2510 Atherosclerotic heart disease of native coronary artery without angina pectoris: Secondary | ICD-10-CM | POA: Diagnosis not present

## 2023-09-27 DIAGNOSIS — I5042 Chronic combined systolic (congestive) and diastolic (congestive) heart failure: Secondary | ICD-10-CM

## 2023-09-27 DIAGNOSIS — E118 Type 2 diabetes mellitus with unspecified complications: Secondary | ICD-10-CM

## 2023-09-27 DIAGNOSIS — I10 Essential (primary) hypertension: Secondary | ICD-10-CM | POA: Diagnosis not present

## 2023-09-27 DIAGNOSIS — I428 Other cardiomyopathies: Secondary | ICD-10-CM | POA: Diagnosis not present

## 2023-09-27 DIAGNOSIS — E785 Hyperlipidemia, unspecified: Secondary | ICD-10-CM

## 2023-09-27 DIAGNOSIS — Z794 Long term (current) use of insulin: Secondary | ICD-10-CM

## 2023-09-27 NOTE — Patient Instructions (Signed)
Medication Instructions:  No Changes *If you need a refill on your cardiac medications before your next appointment, please call your pharmacy*   Lab Work: No Labs If you have labs (blood work) drawn today and your tests are completely normal, you will receive your results only by: MyChart Message (if you have MyChart) OR A paper copy in the mail If you have any lab test that is abnormal or we need to change your treatment, we will call you to review the results.   Testing/Procedures: No Testing   Follow-Up: At [facility], you and your health needs are our priority.  As part of our continuing mission to provide you with exceptional heart care, we have created designated Provider Care Teams.  These Care Teams include your primary Cardiologist (physician) and Advanced Practice Providers (APPs -  Physician Assistants and Nurse Practitioners) who all work together to provide you with the care you need, when you need it.  We recommend signing up for the patient portal called "MyChart".  Sign up information is provided on this After Visit Summary.  MyChart is used to connect with patients for Virtual Visits (Telemedicine).  Patients are able to view lab/test results, encounter notes, upcoming appointments, etc.  Non-urgent messages can be sent to your provider as well.   To learn more about what you can do with MyChart, go to [email or web address].    Your next appointment:   1 year(s)  Provider:   Olga Millers, MD

## 2023-12-29 ENCOUNTER — Other Ambulatory Visit: Payer: Self-pay | Admitting: Hematology

## 2023-12-29 DIAGNOSIS — Z1231 Encounter for screening mammogram for malignant neoplasm of breast: Secondary | ICD-10-CM

## 2024-02-03 ENCOUNTER — Other Ambulatory Visit: Payer: Self-pay | Admitting: Cardiology

## 2024-02-11 ENCOUNTER — Ambulatory Visit: Payer: BC Managed Care – PPO

## 2024-02-17 DIAGNOSIS — I10 Essential (primary) hypertension: Secondary | ICD-10-CM | POA: Diagnosis not present

## 2024-02-17 DIAGNOSIS — E782 Mixed hyperlipidemia: Secondary | ICD-10-CM | POA: Diagnosis not present

## 2024-02-17 DIAGNOSIS — E119 Type 2 diabetes mellitus without complications: Secondary | ICD-10-CM | POA: Diagnosis not present

## 2024-02-25 ENCOUNTER — Ambulatory Visit
Admission: RE | Admit: 2024-02-25 | Discharge: 2024-02-25 | Disposition: A | Discharge status: Home / Self Care | Source: Ambulatory Visit | Attending: Hematology | Admitting: Hematology

## 2024-02-25 DIAGNOSIS — Z1231 Encounter for screening mammogram for malignant neoplasm of breast: Secondary | ICD-10-CM

## 2025-02-02 ENCOUNTER — Ambulatory Visit: Admitting: Cardiology
